# Patient Record
Sex: Male | Born: 1945 | Race: White | Hispanic: No | Marital: Married | Smoking: Former smoker
Health system: Southern US, Community
[De-identification: ages and names within clinical notes are randomized; demographics above are authoritative.]

## PROBLEM LIST (undated history)

## (undated) DIAGNOSIS — B159 Hepatitis A without hepatic coma: Secondary | ICD-10-CM

## (undated) DIAGNOSIS — Z860100 Personal history of colon polyps, unspecified: Secondary | ICD-10-CM

## (undated) DIAGNOSIS — I4891 Unspecified atrial fibrillation: Secondary | ICD-10-CM

## (undated) DIAGNOSIS — I509 Heart failure, unspecified: Secondary | ICD-10-CM

## (undated) DIAGNOSIS — I10 Essential (primary) hypertension: Secondary | ICD-10-CM

## (undated) DIAGNOSIS — J449 Chronic obstructive pulmonary disease, unspecified: Secondary | ICD-10-CM

## (undated) DIAGNOSIS — G4730 Sleep apnea, unspecified: Secondary | ICD-10-CM

## (undated) DIAGNOSIS — F32A Depression, unspecified: Secondary | ICD-10-CM

## (undated) DIAGNOSIS — E785 Hyperlipidemia, unspecified: Secondary | ICD-10-CM

## (undated) DIAGNOSIS — I2510 Atherosclerotic heart disease of native coronary artery without angina pectoris: Secondary | ICD-10-CM

## (undated) DIAGNOSIS — F329 Major depressive disorder, single episode, unspecified: Secondary | ICD-10-CM

## (undated) DIAGNOSIS — C801 Malignant (primary) neoplasm, unspecified: Secondary | ICD-10-CM

## (undated) DIAGNOSIS — I059 Rheumatic mitral valve disease, unspecified: Secondary | ICD-10-CM

## (undated) HISTORY — DX: Atherosclerotic heart disease of native coronary artery without angina pectoris: I25.10

## (undated) HISTORY — PX: CORONARY ARTERY BYPASS GRAFT: SHX141

## (undated) HISTORY — PX: CARDIAC VALVE REPLACEMENT: SHX585

## (undated) HISTORY — PX: CARDIAC SURGERY: SHX584

## (undated) HISTORY — PX: EYE SURGERY: SHX253

## (undated) HISTORY — PX: CORONARY ANGIOPLASTY WITH STENT PLACEMENT: SHX49

---
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
## 1995-04-11 HISTORY — PX: MITRAL VALVE REPAIR: SHX2039

## 2003-07-03 ENCOUNTER — Other Ambulatory Visit: Payer: Self-pay

## 2006-02-05 ENCOUNTER — Ambulatory Visit: Payer: Self-pay | Admitting: Cardiology

## 2007-03-01 ENCOUNTER — Ambulatory Visit: Payer: Self-pay | Admitting: Unknown Physician Specialty

## 2009-04-10 HISTORY — PX: CORONARY ANGIOPLASTY WITH STENT PLACEMENT: SHX49

## 2010-03-16 ENCOUNTER — Ambulatory Visit: Payer: Self-pay | Admitting: Cardiology

## 2011-02-03 DIAGNOSIS — R0602 Shortness of breath: Secondary | ICD-10-CM | POA: Insufficient documentation

## 2011-02-03 DIAGNOSIS — J449 Chronic obstructive pulmonary disease, unspecified: Secondary | ICD-10-CM | POA: Insufficient documentation

## 2011-02-03 DIAGNOSIS — R0683 Snoring: Secondary | ICD-10-CM | POA: Insufficient documentation

## 2011-04-11 HISTORY — PX: CARDIOVERSION: SHX1299

## 2011-05-31 ENCOUNTER — Encounter: Payer: Self-pay | Admitting: Cardiology

## 2011-06-09 ENCOUNTER — Ambulatory Visit: Payer: Self-pay | Admitting: Cardiology

## 2011-06-10 ENCOUNTER — Encounter: Payer: Self-pay | Admitting: Cardiology

## 2011-07-10 ENCOUNTER — Encounter: Payer: Self-pay | Admitting: Cardiology

## 2012-02-07 DIAGNOSIS — B159 Hepatitis A without hepatic coma: Secondary | ICD-10-CM | POA: Insufficient documentation

## 2012-08-16 ENCOUNTER — Ambulatory Visit: Payer: Self-pay | Admitting: Unknown Physician Specialty

## 2013-12-24 DIAGNOSIS — I10 Essential (primary) hypertension: Secondary | ICD-10-CM | POA: Insufficient documentation

## 2013-12-24 DIAGNOSIS — I2510 Atherosclerotic heart disease of native coronary artery without angina pectoris: Secondary | ICD-10-CM | POA: Insufficient documentation

## 2014-01-19 DIAGNOSIS — I2510 Atherosclerotic heart disease of native coronary artery without angina pectoris: Secondary | ICD-10-CM | POA: Insufficient documentation

## 2014-01-19 DIAGNOSIS — I4891 Unspecified atrial fibrillation: Secondary | ICD-10-CM | POA: Insufficient documentation

## 2014-01-19 DIAGNOSIS — G4730 Sleep apnea, unspecified: Secondary | ICD-10-CM | POA: Insufficient documentation

## 2014-08-02 NOTE — Op Note (Signed)
PATIENT NAME:  Leslie Prince, Leslie Prince MR#:  [id number] DATE OF BIRTH:  December 19, 1945  DATE OF PROCEDURE:  06/09/2011  PRIMARY CARE PHYSICIAN: [name], MD  PREPROCEDURE DIAGNOSIS: Atrial fibrillation.   PROCEDURE: Elective cardioversion.   POSTPROCEDURE DIAGNOSIS: Persistent atrial fibrillation.   SURGEON: Isaias Cowman, M.D.   INDICATION: The patient is a 69 year old gentleman with known coronary artery disease with recent diagnosis of atrial fibrillation.   DESCRIPTION OF PROCEDURE: The patient was brought to the special holding area in a fasting state. Anesthesia was obtained with 40 mg of Diprivan. Cardioversion was attempted at 75, 120, and 200 joules. The patient would temporarily convert to sinus then revert to atrial fibrillation. There were no complications. ____________________________ Isaias Cowman, MD ap:slb D: 06/09/2011 07:57:09 ET T: 06/09/2011 10:28:21 ET JOB#: [id number]  cc: Isaias Cowman, MD, <Dictator> Isaias Cowman MD ELECTRONICALLY SIGNED 06/15/2011 13:10

## 2015-01-20 DIAGNOSIS — I5032 Chronic diastolic (congestive) heart failure: Secondary | ICD-10-CM | POA: Insufficient documentation

## 2015-09-21 ENCOUNTER — Encounter: Payer: Self-pay | Admitting: Oncology

## 2015-09-21 ENCOUNTER — Inpatient Hospital Stay: Payer: Medicare Other

## 2015-09-21 ENCOUNTER — Inpatient Hospital Stay: Payer: Medicare Other | Attending: Oncology | Admitting: Oncology

## 2015-09-21 DIAGNOSIS — E669 Obesity, unspecified: Secondary | ICD-10-CM | POA: Insufficient documentation

## 2015-09-21 DIAGNOSIS — E785 Hyperlipidemia, unspecified: Secondary | ICD-10-CM | POA: Insufficient documentation

## 2015-09-21 DIAGNOSIS — Z87891 Personal history of nicotine dependence: Secondary | ICD-10-CM | POA: Diagnosis not present

## 2015-09-21 DIAGNOSIS — D72829 Elevated white blood cell count, unspecified: Secondary | ICD-10-CM | POA: Insufficient documentation

## 2015-09-21 DIAGNOSIS — Z79899 Other long term (current) drug therapy: Secondary | ICD-10-CM | POA: Diagnosis not present

## 2015-09-21 DIAGNOSIS — Z9109 Other allergy status, other than to drugs and biological substances: Secondary | ICD-10-CM | POA: Insufficient documentation

## 2015-09-21 DIAGNOSIS — I059 Rheumatic mitral valve disease, unspecified: Secondary | ICD-10-CM | POA: Insufficient documentation

## 2015-09-21 DIAGNOSIS — I509 Heart failure, unspecified: Secondary | ICD-10-CM | POA: Insufficient documentation

## 2015-09-21 LAB — CBC WITH DIFFERENTIAL/PLATELET
BASOS ABS: 0 10*3/uL (ref 0–0.1)
BASOS PCT: 0 %
EOS ABS: 0.3 10*3/uL (ref 0–0.7)
Eosinophils Relative: 5 %
HCT: 38.1 % — ABNORMAL LOW (ref 40.0–52.0)
HEMOGLOBIN: 13.2 g/dL (ref 13.0–18.0)
LYMPHS ABS: 1.1 10*3/uL (ref 1.0–3.6)
LYMPHS PCT: 17 %
MCH: 31.8 pg (ref 26.0–34.0)
MCHC: 34.6 g/dL (ref 32.0–36.0)
MCV: 91.9 fL (ref 80.0–100.0)
Monocytes Absolute: 0.6 10*3/uL (ref 0.2–1.0)
Monocytes Relative: 10 %
NEUTROS ABS: 4.3 10*3/uL (ref 1.4–6.5)
Neutrophils Relative %: 68 %
Platelets: 149 10*3/uL — ABNORMAL LOW (ref 150–440)
RBC: 4.15 MIL/uL — ABNORMAL LOW (ref 4.40–5.90)
RDW: 13.7 % (ref 11.5–14.5)
WBC: 6.4 10*3/uL (ref 3.8–10.6)

## 2015-09-24 LAB — COMP PANEL: LEUKEMIA/LYMPHOMA

## 2015-09-24 LAB — BCR-ABL1 KINASE DOMAIN MUTATION ANALYSIS

## 2015-09-28 NOTE — Progress Notes (Signed)
[facility]  [phone] [phone]  ID: Leslie Prince OB: 17-Feb-1946  MR#: [id number]  QUR#:[id number]  Patient Care Team: Dorothey Baseman, MD as PCP - General (Family Medicine)  CHIEF COMPLAINT: Leukocytosis.  INTERVAL HISTORY: Patient is a 70 year old male who was found to have a persistently elevated white blood cell count on routine blood work. Repeat testing confirmed the results. Currently, he feels well and is asymptomatic. He denies any recent fevers or illnesses. He has no new medications. He has a good appetite and denies weight loss. He has no neurologic complaints. He denies any chest pain or shortness of breath. He denies any nausea, vomiting, constipation, or diarrhea. He has no urinary complaints. Patient feels at his baseline and offers no specific complaints today.  REVIEW OF SYSTEMS:   Review of Systems  Constitutional: Negative.  Negative for fever, weight loss and malaise/fatigue.  Respiratory: Negative.  Negative for cough and shortness of breath.   Cardiovascular: Negative.  Negative for chest pain.  Gastrointestinal: Negative.  Negative for abdominal pain.  Genitourinary: Negative.   Musculoskeletal: Negative.   Neurological: Negative.  Negative for weakness.  Psychiatric/Behavioral: Negative.     As per HPI. Otherwise, a complete review of systems is negatve.  PAST MEDICAL HISTORY: No past medical history on file.  PAST SURGICAL HISTORY: No past surgical history on file.  FAMILY HISTORY: Reviewed and unchanged. No reported history of malignancy or chronic disease.     ADVANCED DIRECTIVES:    HEALTH MAINTENANCE: Social History  Substance Use Topics  . Smoking status: Former Smoker -- 1.00 packs/day for 25 years    Types: Cigarettes    Quit date: 06/21/1995  . Smokeless tobacco: Not on file  . Alcohol Use: Not on file     Colonoscopy:  PAP:  Bone density:  Lipid panel:  Allergies  Allergen Reactions    . Lisinopril Other (See Comments)    Severe urination    Current Outpatient Prescriptions  Medication Sig Dispense Refill  . albuterol (PROAIR HFA) 108 (90 Base) MCG/ACT inhaler Inhale into the lungs.    Marland Kitchen amoxicillin (AMOXIL) 500 MG capsule     . aspirin EC 81 MG tablet Take by mouth.    Marland Kitchen atorvastatin (LIPITOR) 20 MG tablet Take 20 mg by mouth daily.  1  . dabigatran (PRADAXA) 150 MG CAPS capsule Take by mouth.    . enalapril (VASOTEC) 20 MG tablet     . Fluticasone-Salmeterol (ADVAIR DISKUS) 250-50 MCG/DOSE AEPB     . furosemide (LASIX) 40 MG tablet Take by mouth.    . hydrochlorothiazide (HYDRODIURIL) 25 MG tablet     . KLOR-CON M10 10 MEQ tablet Take 20 mEq by mouth daily.  4  . metoprolol succinate (TOPROL-XL) 50 MG 24 hr tablet     . montelukast (SINGULAIR) 10 MG tablet TAKE 1 TABLET (10 MG TOTAL) BY MOUTH NIGHTLY.  3  . potassium chloride (K-DUR) 10 MEQ tablet Take by mouth.    . tiotropium (SPIRIVA HANDIHALER) 18 MCG inhalation capsule Place into inhaler and inhale.    . vitamin C (ASCORBIC ACID) 500 MG tablet Take by mouth.    . vitamin E 400 UNIT capsule Take by mouth.    . Multiple Vitamins tablet Take by mouth.     No current facility-administered medications for this visit.    OBJECTIVE: There were no vitals filed for this visit.   There is no height or weight on file to calculate BMI.  ECOG FS:0 - Asymptomatic  General: Well-developed, well-nourished, no acute distress. Eyes: Pink conjunctiva, anicteric sclera. HEENT: Normocephalic, moist mucous membranes, clear oropharnyx. Lungs: Clear to auscultation bilaterally. Heart: Regular rate and rhythm. No rubs, murmurs, or gallops. Abdomen: Soft, nontender, nondistended. No organomegaly noted, normoactive bowel sounds. Musculoskeletal: No edema, cyanosis, or clubbing. Neuro: Alert, answering all questions appropriately. Cranial nerves grossly intact. Skin: No rashes or petechiae noted. Psych: Normal  affect. Lymphatics: No cervical, calvicular, axillary or inguinal LAD.   LAB RESULTS:  No results found for: NA, K, CL, CO2, GLUCOSE, BUN, CREATININE, CALCIUM, PROT, ALBUMIN, AST, ALT, ALKPHOS, BILITOT, GFRNONAA, GFRAA  Lab Results  Component Value Date   WBC 6.4 09/21/2015   NEUTROABS 4.3 09/21/2015   HGB 13.2 09/21/2015   HCT 38.1* 09/21/2015   MCV 91.9 09/21/2015   PLT 149* 09/21/2015     STUDIES: No results found.  ASSESSMENT: Leukocytosis, resolved.  PLAN:    1. Leukocytosis: Patient previously had persistently elevated white blood cell count, but today it is normal at 6.4. The remainder of his blood work including peripheral blood flow cytometry to assess for CLL as well as BCR-ABL to assess for CML are either negative or within normal limits. No intervention is needed at this time. Patient does not require bone marrow biopsy. Return to clinic in 6 months with repeat laboratory work and further evaluation. If his white blood cell count continues to be within normal limits, he likely can be discharged from clinic.  Patient expressed understanding and was in agreement with this plan. He also understands that He can call clinic at any time with any questions, concerns, or complaints.    Lloyd Huger, MD   09/28/2015 8:36 AM

## 2015-11-03 ENCOUNTER — Encounter: Payer: Self-pay | Admitting: Oncology

## 2016-03-21 ENCOUNTER — Ambulatory Visit: Payer: Medicare Other | Admitting: Oncology

## 2016-03-21 ENCOUNTER — Other Ambulatory Visit: Payer: Medicare Other

## 2017-05-22 ENCOUNTER — Emergency Department: Payer: Medicare Other

## 2017-05-22 ENCOUNTER — Inpatient Hospital Stay
Admission: EM | Admit: 2017-05-22 | Discharge: 2017-05-25 | DRG: 871 | Disposition: A | Discharge status: Home Health | Payer: Medicare Other | Attending: Internal Medicine | Admitting: Internal Medicine

## 2017-05-22 ENCOUNTER — Other Ambulatory Visit: Payer: Self-pay

## 2017-05-22 ENCOUNTER — Encounter: Payer: Self-pay | Admitting: Emergency Medicine

## 2017-05-22 DIAGNOSIS — E785 Hyperlipidemia, unspecified: Secondary | ICD-10-CM | POA: Diagnosis present

## 2017-05-22 DIAGNOSIS — Z7982 Long term (current) use of aspirin: Secondary | ICD-10-CM

## 2017-05-22 DIAGNOSIS — I50813 Acute on chronic right heart failure: Secondary | ICD-10-CM

## 2017-05-22 DIAGNOSIS — R0902 Hypoxemia: Secondary | ICD-10-CM | POA: Diagnosis present

## 2017-05-22 DIAGNOSIS — Z7902 Long term (current) use of antithrombotics/antiplatelets: Secondary | ICD-10-CM | POA: Diagnosis not present

## 2017-05-22 DIAGNOSIS — I110 Hypertensive heart disease with heart failure: Secondary | ICD-10-CM | POA: Diagnosis present

## 2017-05-22 DIAGNOSIS — Z955 Presence of coronary angioplasty implant and graft: Secondary | ICD-10-CM

## 2017-05-22 DIAGNOSIS — Z888 Allergy status to other drugs, medicaments and biological substances status: Secondary | ICD-10-CM | POA: Diagnosis not present

## 2017-05-22 DIAGNOSIS — I2510 Atherosclerotic heart disease of native coronary artery without angina pectoris: Secondary | ICD-10-CM | POA: Diagnosis present

## 2017-05-22 DIAGNOSIS — I4891 Unspecified atrial fibrillation: Secondary | ICD-10-CM | POA: Diagnosis present

## 2017-05-22 DIAGNOSIS — I482 Chronic atrial fibrillation: Secondary | ICD-10-CM | POA: Diagnosis present

## 2017-05-22 DIAGNOSIS — A419 Sepsis, unspecified organism: Secondary | ICD-10-CM | POA: Diagnosis present

## 2017-05-22 DIAGNOSIS — E0590 Thyrotoxicosis, unspecified without thyrotoxic crisis or storm: Secondary | ICD-10-CM | POA: Diagnosis present

## 2017-05-22 DIAGNOSIS — I5032 Chronic diastolic (congestive) heart failure: Secondary | ICD-10-CM | POA: Diagnosis present

## 2017-05-22 DIAGNOSIS — Z952 Presence of prosthetic heart valve: Secondary | ICD-10-CM

## 2017-05-22 DIAGNOSIS — J189 Pneumonia, unspecified organism: Secondary | ICD-10-CM | POA: Diagnosis present

## 2017-05-22 DIAGNOSIS — J440 Chronic obstructive pulmonary disease with acute lower respiratory infection: Secondary | ICD-10-CM | POA: Diagnosis present

## 2017-05-22 DIAGNOSIS — R0602 Shortness of breath: Secondary | ICD-10-CM | POA: Diagnosis not present

## 2017-05-22 DIAGNOSIS — Z87891 Personal history of nicotine dependence: Secondary | ICD-10-CM

## 2017-05-22 HISTORY — DX: Sleep apnea, unspecified: G47.30

## 2017-05-22 HISTORY — DX: Chronic obstructive pulmonary disease, unspecified: J44.9

## 2017-05-22 HISTORY — DX: Heart failure, unspecified: I50.9

## 2017-05-22 LAB — CBC WITH DIFFERENTIAL/PLATELET
BASOS ABS: 0 10*3/uL (ref 0–0.1)
BASOS PCT: 0 %
EOS PCT: 0 %
Eosinophils Absolute: 0.1 10*3/uL (ref 0–0.7)
HCT: 39.2 % — ABNORMAL LOW (ref 40.0–52.0)
Hemoglobin: 13 g/dL (ref 13.0–18.0)
Lymphocytes Relative: 4 %
Lymphs Abs: 0.5 10*3/uL — ABNORMAL LOW (ref 1.0–3.6)
MCH: 30 pg (ref 26.0–34.0)
MCHC: 33.1 g/dL (ref 32.0–36.0)
MCV: 90.6 fL (ref 80.0–100.0)
MONO ABS: 1 10*3/uL (ref 0.2–1.0)
Monocytes Relative: 8 %
NEUTROS ABS: 11.1 10*3/uL — AB (ref 1.4–6.5)
Neutrophils Relative %: 88 %
PLATELETS: 222 10*3/uL (ref 150–440)
RBC: 4.32 MIL/uL — ABNORMAL LOW (ref 4.40–5.90)
RDW: 14.6 % — AB (ref 11.5–14.5)
WBC: 12.7 10*3/uL — AB (ref 3.8–10.6)

## 2017-05-22 LAB — INFLUENZA PANEL BY PCR (TYPE A & B)
Influenza A By PCR: NEGATIVE
Influenza B By PCR: NEGATIVE

## 2017-05-22 LAB — BLOOD GAS, VENOUS
Acid-Base Excess: 1.7 mmol/L (ref 0.0–2.0)
Bicarbonate: 25.8 mmol/L (ref 20.0–28.0)
O2 Saturation: 79.6 %
PATIENT TEMPERATURE: 37
PCO2 VEN: 38 mmHg — AB (ref 44.0–60.0)
PH VEN: 7.44 — AB (ref 7.250–7.430)
PO2 VEN: 42 mmHg (ref 32.0–45.0)

## 2017-05-22 LAB — COMPREHENSIVE METABOLIC PANEL
ALBUMIN: 3.2 g/dL — AB (ref 3.5–5.0)
ALK PHOS: 60 U/L (ref 38–126)
ALT: 31 U/L (ref 17–63)
ANION GAP: 12 (ref 5–15)
AST: 43 U/L — ABNORMAL HIGH (ref 15–41)
BILIRUBIN TOTAL: 2 mg/dL — AB (ref 0.3–1.2)
BUN: 15 mg/dL (ref 6–20)
CALCIUM: 8.7 mg/dL — AB (ref 8.9–10.3)
CO2: 25 mmol/L (ref 22–32)
Chloride: 100 mmol/L — ABNORMAL LOW (ref 101–111)
Creatinine, Ser: 1.01 mg/dL (ref 0.61–1.24)
GFR calc Af Amer: >60 mL/min (ref >60)
GFR calc non Af Amer: >60 mL/min (ref >60)
GLUCOSE: 116 mg/dL — AB (ref 65–99)
Potassium: 3.7 mmol/L (ref 3.5–5.1)
Sodium: 137 mmol/L (ref 135–145)
TOTAL PROTEIN: 7.5 g/dL (ref 6.5–8.1)

## 2017-05-22 LAB — URINALYSIS, COMPLETE (UACMP) WITH MICROSCOPIC
Bacteria, UA: NONE SEEN
Bilirubin Urine: NEGATIVE
Glucose, UA: NEGATIVE mg/dL
Ketones, ur: 5 mg/dL — AB
Nitrite: NEGATIVE
Protein, ur: NEGATIVE mg/dL
Specific Gravity, Urine: 1.014 (ref 1.005–1.030)
Squamous Epithelial / LPF: NONE SEEN
pH: 5 (ref 5.0–8.0)

## 2017-05-22 LAB — LACTIC ACID, PLASMA: Lactic Acid, Venous: 1.6 mmol/L (ref 0.5–1.9)

## 2017-05-22 LAB — TSH: TSH: 0.258 u[IU]/mL — ABNORMAL LOW (ref 0.350–4.500)

## 2017-05-22 LAB — TROPONIN I: TROPONIN I: 0.04 ng/mL — AB (ref <0.03)

## 2017-05-22 LAB — BRAIN NATRIURETIC PEPTIDE: B NATRIURETIC PEPTIDE 5: 317 pg/mL — AB (ref 0.0–100.0)

## 2017-05-22 MED ORDER — SODIUM CHLORIDE 0.9 % IV BOLUS (SEPSIS)
500.0000 mL | Freq: Once | INTRAVENOUS | Status: AC
  Administered 2017-05-22: 500 mL via INTRAVENOUS

## 2017-05-22 MED ORDER — MONTELUKAST SODIUM 10 MG PO TABS
10.0000 mg | ORAL_TABLET | Freq: Every day | ORAL | Status: DC
  Administered 2017-05-22 – 2017-05-24 (×3): 10 mg via ORAL
  Filled 2017-05-22 (×3): qty 1

## 2017-05-22 MED ORDER — ACETAMINOPHEN 325 MG PO TABS
650.0000 mg | ORAL_TABLET | Freq: Four times a day (QID) | ORAL | Status: DC | PRN
  Administered 2017-05-23: 650 mg via ORAL
  Filled 2017-05-22 (×2): qty 2

## 2017-05-22 MED ORDER — COLCHICINE 0.6 MG PO TABS
1.2000 mg | ORAL_TABLET | Freq: Every day | ORAL | Status: DC | PRN
  Administered 2017-05-24 (×2): 1.2 mg via ORAL
  Filled 2017-05-22: qty 2

## 2017-05-22 MED ORDER — ONDANSETRON HCL 4 MG PO TABS: 4.0000 mg | ORAL_TABLET | Freq: Four times a day (QID) | ORAL | Status: DC | PRN

## 2017-05-22 MED ORDER — ONDANSETRON HCL 4 MG/2ML IJ SOLN: 4.0000 mg | Freq: Four times a day (QID) | INTRAMUSCULAR | Status: DC | PRN

## 2017-05-22 MED ORDER — SODIUM CHLORIDE 0.9 % IV SOLN
500.0000 mg | Freq: Once | INTRAVENOUS | Status: AC
  Administered 2017-05-22: 500 mg via INTRAVENOUS
  Filled 2017-05-22: qty 500

## 2017-05-22 MED ORDER — ORAL CARE MOUTH RINSE
15.0000 mL | Freq: Two times a day (BID) | OROMUCOSAL | Status: DC
  Administered 2017-05-22 – 2017-05-24 (×3): 15 mL via OROMUCOSAL

## 2017-05-22 MED ORDER — FUROSEMIDE 40 MG PO TABS
40.0000 mg | ORAL_TABLET | Freq: Every day | ORAL | Status: DC
  Administered 2017-05-22 – 2017-05-25 (×4): 40 mg via ORAL
  Filled 2017-05-22 (×4): qty 1

## 2017-05-22 MED ORDER — DILTIAZEM HCL 25 MG/5ML IV SOLN
15.0000 mg | Freq: Once | INTRAVENOUS | Status: AC
  Administered 2017-05-22: 15 mg via INTRAVENOUS
  Filled 2017-05-22: qty 5

## 2017-05-22 MED ORDER — ATORVASTATIN CALCIUM 20 MG PO TABS
20.0000 mg | ORAL_TABLET | Freq: Every day | ORAL | Status: DC
  Administered 2017-05-22 – 2017-05-24 (×3): 20 mg via ORAL
  Filled 2017-05-22 (×3): qty 1

## 2017-05-22 MED ORDER — ALBUTEROL SULFATE (2.5 MG/3ML) 0.083% IN NEBU: 2.5000 mg | INHALATION_SOLUTION | Freq: Four times a day (QID) | RESPIRATORY_TRACT | Status: DC | PRN

## 2017-05-22 MED ORDER — CHROMIUM 400 MCG PO TABS: 1.0000 | ORAL_TABLET | Freq: Two times a day (BID) | ORAL | Status: DC

## 2017-05-22 MED ORDER — TIOTROPIUM BROMIDE MONOHYDRATE 18 MCG IN CAPS
18.0000 ug | ORAL_CAPSULE | Freq: Every day | RESPIRATORY_TRACT | Status: DC
  Administered 2017-05-22 – 2017-05-25 (×4): 18 ug via RESPIRATORY_TRACT
  Filled 2017-05-22: qty 5

## 2017-05-22 MED ORDER — VITAMIN C 500 MG PO TABS
500.0000 mg | ORAL_TABLET | Freq: Every day | ORAL | Status: DC
  Administered 2017-05-22 – 2017-05-25 (×4): 500 mg via ORAL
  Filled 2017-05-22 (×4): qty 1

## 2017-05-22 MED ORDER — DILTIAZEM HCL 25 MG/5ML IV SOLN: 20.0000 mg | Freq: Once | INTRAVENOUS | Status: DC

## 2017-05-22 MED ORDER — VITAMIN E 180 MG (400 UNIT) PO CAPS
400.0000 [IU] | ORAL_CAPSULE | Freq: Every day | ORAL | Status: DC
  Administered 2017-05-22 – 2017-05-25 (×4): 400 [IU] via ORAL
  Filled 2017-05-22 (×4): qty 1

## 2017-05-22 MED ORDER — SODIUM CHLORIDE 0.9 % IV SOLN
2.0000 g | INTRAVENOUS | Status: DC
  Administered 2017-05-22 – 2017-05-25 (×4): 2 g via INTRAVENOUS
  Filled 2017-05-22 (×4): qty 20

## 2017-05-22 MED ORDER — SODIUM CHLORIDE 0.9 % IV SOLN
INTRAVENOUS | Status: DC
  Administered 2017-05-22: 07:00:00 via INTRAVENOUS

## 2017-05-22 MED ORDER — GUAIFENESIN-DM 100-10 MG/5ML PO SYRP
5.0000 mL | ORAL_SOLUTION | ORAL | Status: DC | PRN
  Administered 2017-05-22: 5 mL via ORAL
  Filled 2017-05-22: qty 5

## 2017-05-22 MED ORDER — DOCUSATE SODIUM 100 MG PO CAPS
100.0000 mg | ORAL_CAPSULE | Freq: Two times a day (BID) | ORAL | Status: DC
  Administered 2017-05-22 – 2017-05-25 (×4): 100 mg via ORAL
  Filled 2017-05-22 (×6): qty 1

## 2017-05-22 MED ORDER — IPRATROPIUM-ALBUTEROL 0.5-2.5 (3) MG/3ML IN SOLN
3.0000 mL | Freq: Once | RESPIRATORY_TRACT | Status: AC
  Administered 2017-05-22: 3 mL via RESPIRATORY_TRACT
  Filled 2017-05-22: qty 3

## 2017-05-22 MED ORDER — MOMETASONE FURO-FORMOTEROL FUM 200-5 MCG/ACT IN AERO
2.0000 | INHALATION_SPRAY | Freq: Two times a day (BID) | RESPIRATORY_TRACT | Status: DC
  Administered 2017-05-22 – 2017-05-25 (×7): 2 via RESPIRATORY_TRACT
  Filled 2017-05-22: qty 8.8

## 2017-05-22 MED ORDER — SODIUM CHLORIDE 0.9 % IV SOLN
1.0000 g | Freq: Once | INTRAVENOUS | Status: AC
  Administered 2017-05-22: 1 g via INTRAVENOUS
  Filled 2017-05-22: qty 10

## 2017-05-22 MED ORDER — POTASSIUM CHLORIDE CRYS ER 20 MEQ PO TBCR
10.0000 meq | EXTENDED_RELEASE_TABLET | Freq: Two times a day (BID) | ORAL | Status: DC
  Administered 2017-05-22 – 2017-05-25 (×6): 10 meq via ORAL
  Filled 2017-05-22 (×7): qty 1

## 2017-05-22 MED ORDER — DABIGATRAN ETEXILATE MESYLATE 150 MG PO CAPS
150.0000 mg | ORAL_CAPSULE | Freq: Two times a day (BID) | ORAL | Status: DC
  Administered 2017-05-22 – 2017-05-25 (×7): 150 mg via ORAL
  Filled 2017-05-22 (×8): qty 1

## 2017-05-22 MED ORDER — SODIUM CHLORIDE 0.9% FLUSH
3.0000 mL | Freq: Two times a day (BID) | INTRAVENOUS | Status: DC
  Administered 2017-05-22 – 2017-05-25 (×5): 3 mL via INTRAVENOUS

## 2017-05-22 MED ORDER — METOPROLOL SUCCINATE ER 50 MG PO TB24
50.0000 mg | ORAL_TABLET | Freq: Every day | ORAL | Status: DC
  Administered 2017-05-22 – 2017-05-25 (×4): 50 mg via ORAL
  Filled 2017-05-22 (×4): qty 1

## 2017-05-22 MED ORDER — SODIUM CHLORIDE 0.9 % IV SOLN
500.0000 mg | INTRAVENOUS | Status: DC
  Administered 2017-05-22 – 2017-05-24 (×3): 500 mg via INTRAVENOUS
  Filled 2017-05-22 (×4): qty 500

## 2017-05-22 MED ORDER — ACETAMINOPHEN 500 MG PO TABS
1000.0000 mg | ORAL_TABLET | Freq: Once | ORAL | Status: AC
  Administered 2017-05-22: 1000 mg via ORAL
  Filled 2017-05-22: qty 2

## 2017-05-22 MED ORDER — AMIODARONE HCL IN DEXTROSE 360-4.14 MG/200ML-% IV SOLN
60.0000 mg/h | INTRAVENOUS | Status: AC
  Administered 2017-05-22: 60 mg/h via INTRAVENOUS
  Filled 2017-05-22: qty 200

## 2017-05-22 MED ORDER — AMIODARONE HCL IN DEXTROSE 360-4.14 MG/200ML-% IV SOLN
30.0000 mg/h | INTRAVENOUS | Status: DC
  Administered 2017-05-22 – 2017-05-24 (×5): 30 mg/h via INTRAVENOUS
  Filled 2017-05-22 (×5): qty 200

## 2017-05-22 MED ORDER — ENALAPRIL MALEATE 10 MG PO TABS
20.0000 mg | ORAL_TABLET | Freq: Two times a day (BID) | ORAL | Status: DC
Start: 2017-05-22 — End: 2017-05-25
  Administered 2017-05-22 – 2017-05-25 (×7): 20 mg via ORAL
  Filled 2017-05-22 (×8): qty 2

## 2017-05-22 MED ORDER — ACETAMINOPHEN 650 MG RE SUPP: 650.0000 mg | Freq: Four times a day (QID) | RECTAL | Status: DC | PRN

## 2017-05-22 MED ORDER — ADULT MULTIVITAMIN W/MINERALS CH
1.0000 | ORAL_TABLET | Freq: Every day | ORAL | Status: DC
  Administered 2017-05-22 – 2017-05-25 (×4): 1 via ORAL
  Filled 2017-05-22 (×4): qty 1

## 2017-05-22 MED ORDER — ASPIRIN EC 81 MG PO TBEC
81.0000 mg | DELAYED_RELEASE_TABLET | Freq: Every day | ORAL | Status: DC
  Administered 2017-05-22 – 2017-05-25 (×4): 81 mg via ORAL
  Filled 2017-05-22 (×4): qty 1

## 2017-05-22 MED ORDER — SODIUM CHLORIDE 0.9 % IV BOLUS (SEPSIS)
500.0000 mL | INTRAVENOUS | Status: AC
  Administered 2017-05-22: 500 mL via INTRAVENOUS

## 2017-05-22 MED ORDER — COLCHICINE 0.6 MG PO TABS
0.6000 mg | ORAL_TABLET | ORAL | Status: DC
Start: 2017-05-22 — End: 2017-05-22

## 2017-05-22 NOTE — Progress Notes (Signed)
This is a 71 year old male admitted for atrial fibrillation with rapid ventricular rate. 1.  Atrial fibrillation: With RVR; stable but with persistent tachycardia.  I started the patient on amiodarone drip to control his heart rate.  Continue Pradaxa and aspirin for anticoagulation.  Monitor telemetry.  Consult cardiology. 2.  Pneumonia: Community-acquired; continue ceftriaxone and azithromycin.  Supplemental oxygen as needed. 3.  Sepsis: The patient meets all 4 criteria for sepsis.  He is hemodynamically stable.  Follow blood cultures for growth and sensitivities. 4.  CHF: Diastolic; chronic.  The patient is tolerated fluid boluses well.  Continue lisinopril and metoprolol. 5.  COPD: Consider addition of steroids.  Also continue Spiriva, inhaled corticosteroid and albuterol as needed. 6.  DVT prophylaxis: As above 7.  GI prophylaxis: None The patient is a full code.  Time spent on admission orders and critical care approximately 45 minutes.  Discussed with [facility] telemedicine  Agree with above stated plan

## 2017-05-22 NOTE — ED Provider Notes (Signed)
[facility] Emergency Department Provider Note   ____________________________________________   First MD Initiated Contact with Patient 05/22/17 0140     (approximate)  I have reviewed the triage vital signs and the nursing notes.   HISTORY  Chief Complaint Shortness of Breath    HPI Leslie Prince is a 72 y.o. male who comes into the hospital today with some shortness of breath.  The patient states that he has either had the flu or some cold symptoms since last Wednesday which was approximately 5-6 days ago.  He reports that he also has some COPD and A. fib.  He states that he was getting better but then today he could not stop coughing.  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  The patient reports that he had a fever at home to 100.8.  He last took Tylenol yesterday.  His wife states that he has been a little bit disoriented.  He states that when he coughs its brown looking stuff and looks like some dried up blood.  The patient is here today for evaluation.  Past Medical History:  Diagnosis Date  . COPD (chronic obstructive pulmonary disease) ([place])   . Sleep apnea     Patient Active Problem List   Diagnosis Date Noted  . Atrial fibrillation with RVR ([place]) 05/22/2017  . Congestive heart failure ([place]) 09/21/2015  . Allergy to environmental factors 09/21/2015  . HLD (hyperlipidemia) 09/21/2015  . Disorder of mitral valve 09/21/2015  . Adiposity 09/21/2015  . Chronic diastolic heart failure ([place]) 01/20/2015  . Atrial fibrillation ([place]) 01/19/2014  . Apnea, sleep 01/19/2014  . Arteriosclerosis of coronary artery 01/19/2014  . BP (high blood pressure) 12/24/2013  . Hepatitis A 02/07/2012  . Chronic obstructive pulmonary disease ([place]) 02/03/2011  . Breath shortness 02/03/2011  . Snores 02/03/2011    Past Surgical History:  Procedure Laterality Date  . CARDIAC SURGERY    . CORONARY ANGIOPLASTY WITH  STENT PLACEMENT      Prior to Admission medications   Medication Sig Start Date End Date Taking? Authorizing Provider  albuterol (PROAIR HFA) 108 (90 Base) MCG/ACT inhaler Inhale 2 puffs into the lungs every 6 (six) hours as needed for wheezing.  07/28/14  Yes [provider]  albuterol (PROVENTIL) (2.5 MG/3ML) 0.083% nebulizer solution Take 2.5 mg by nebulization every 6 (six) hours as needed for wheezing.   Yes [provider]  aspirin EC 81 MG tablet Take 81 mg by mouth daily.    Yes [provider]  atorvastatin (LIPITOR) 20 MG tablet Take 20 mg by mouth daily. 09/03/15  Yes [provider]  Chromium 400 MCG TABS Take 1 tablet by mouth 2 (two) times daily.   Yes [provider]  colchicine 0.6 MG tablet Take 0.6 mg by mouth as directed. Take 2 tablets at onset of gout flare, then 1 tablet one hour later   Yes [provider]  dabigatran (PRADAXA) 150 MG CAPS capsule Take 150 mg by mouth 2 (two) times daily.  05/11/15  Yes [provider]  enalapril (VASOTEC) 20 MG tablet Take 20 mg by mouth 2 (two) times daily.  09/07/15  Yes [provider]  Fluticasone-Salmeterol (ADVAIR DISKUS) 250-50 MCG/DOSE AEPB Inhale 1 puff into the lungs 2 (two) times daily.  01/26/15  Yes [provider]  furosemide (LASIX) 40 MG tablet Take 40 mg by mouth daily.   Yes [provider]  Rebeca Allegra  M10 10 MEQ tablet Take 10 mEq by mouth 2 (two) times daily.  09/03/15  Yes [provider]  metoprolol succinate (TOPROL-XL) 50 MG 24 hr tablet Take 50 mg by mouth daily.  01/25/15  Yes [provider]  montelukast (SINGULAIR) 10 MG tablet TAKE 1 TABLET (10 MG TOTAL) BY MOUTH NIGHTLY. 08/06/15  Yes [provider]  Multiple Vitamins tablet Take 1 tablet by mouth daily.    Yes [provider]  tiotropium (SPIRIVA) 18 MCG inhalation capsule Place 18 mcg into inhaler and inhale daily.   Yes [provider]  vitamin C (ASCORBIC ACID) 500 MG tablet Take 500 mg by mouth daily.    Yes [provider]  vitamin E 400 UNIT capsule Take 400 Units by mouth daily.    Yes [provider]    Allergies Lisinopril  No family history on file.  Social History Social History   Tobacco Use  . Smoking status: Former Smoker    Packs/day: 1.00    Years: 25.00    Pack years: 25.00    Types: Cigarettes    Last attempt to quit: 06/21/1995    Years since quitting: 21.9  . Smokeless tobacco: Never Used  Substance Use Topics  . Alcohol use: Yes  . Drug use: Not on file    Review of Systems  Constitutional:  fever/chills Eyes: No visual changes. ENT: No sore throat. Cardiovascular: palpitations Respiratory: cough and shortness of breath. Gastrointestinal: No abdominal pain.  No nausea, no vomiting.  No diarrhea.  No constipation. Genitourinary: Negative for dysuria. Musculoskeletal: Negative for back pain. Skin: Negative for rash. Neurological: Negative for headaches, focal weakness or numbness. Psych: mild confusion  ____________________________________________   PHYSICAL EXAM:  VITAL SIGNS: ED Triage Vitals  Enc Vitals Group     BP 05/22/17 0133 (!) 140/97     Pulse Rate 05/22/17 0133 (!) 155     Resp 05/22/17 0133 18     Temp 05/22/17 0133 (!) 100.4 F (38 C)     Temp Source 05/22/17 0133 Oral     SpO2 05/22/17 0133 91 %     Weight 05/22/17 0130 285 lb (129.3 kg)     Height 05/22/17 0130 5\' 10"  (1.778 m)     Head Circumference --      Peak Flow --      Pain Score --      Pain Loc --      Pain Edu? --      Excl. in [place]? --     Constitutional: Alert and oriented. Ill appearing and in moderate distress. Eyes: Conjunctivae are normal. PERRL. EOMI. Head: Atraumatic. Nose: No congestion/rhinnorhea. Mouth/Throat: Mucous membranes are moist.  Oropharynx non-erythematous. Cardiovascular: Tachycardia, irregular rhythm. Grossly normal heart sounds.  Good peripheral  circulation. Respiratory: Tachypnea.  No retractions. Mild expiratory wheezes Gastrointestinal: Soft and nontender. No distention. Positive bowel sounds Musculoskeletal: No lower extremity tenderness nor edema.   Neurologic:  Normal speech and language.  Skin:  Skin is warm, dry and intact.  Psychiatric: Mood and affect are normal.   ____________________________________________   LABS (all labs ordered are listed, but only abnormal results are displayed)  Labs Reviewed  COMPREHENSIVE METABOLIC PANEL - Abnormal; Notable for the following components:      Result Value   Chloride 100 (*)    Glucose, Bld 116 (*)    Calcium 8.7 (*)    Albumin 3.2 (*)    AST 43 (*)    Total  Bilirubin 2.0 (*)    All other components within normal limits  CBC WITH DIFFERENTIAL/PLATELET - Abnormal; Notable for the following components:   WBC 12.7 (*)    RBC 4.32 (*)    HCT 39.2 (*)    RDW 14.6 (*)    Neutro Abs 11.1 (*)    Lymphs Abs 0.5 (*)    All other components within normal limits  TROPONIN I - Abnormal; Notable for the following components:   Troponin I 0.04 (*)    All other components within normal limits  BRAIN NATRIURETIC PEPTIDE - Abnormal; Notable for the following components:   B Natriuretic Peptide 317.0 (*)    All other components within normal limits  BLOOD GAS, VENOUS - Abnormal; Notable for the following components:   pH, Ven 7.44 (*)    pCO2, Ven 38 (*)    All other components within normal limits  CULTURE, BLOOD (ROUTINE X 2)  CULTURE, BLOOD (ROUTINE X 2)  LACTIC ACID, PLASMA  INFLUENZA PANEL BY PCR (TYPE A & B)  URINALYSIS, COMPLETE (UACMP) WITH MICROSCOPIC   ____________________________________________  EKG  ED ECG REPORT I, Loney Hering, the attending physician, personally viewed and interpreted this ECG.   Date: 05/22/2017  EKG Time: 132  Rate: 155  Rhythm: atrial fibrillation with rapid ventricular rate, rate 155  Axis: normal  Intervals:none  ST&T  Change: ST depression in lead II  ____________________________________________  RADIOLOGY  ED MD interpretation:  CXR: Right lower lobe pneumonia  Official radiology report(s): Dg Chest Portable 1 View  Result Date: 05/22/2017 CLINICAL DATA:  Dyspnea and palpitations since Wednesday. Productive cough. EXAM: PORTABLE CHEST 1 VIEW COMPARISON:  Report from 02/01/2017 FINDINGS: Cardiomegaly with aortic atherosclerosis. Median sternotomy sutures with evidence of prior mitral valvular replacement. Patchy airspace opacities are noted in both lower lobes suspicious for pneumonia. No acute osseous appearing abnormality. IMPRESSION: Pneumonic consolidations in both lower lobes. Followup PA and lateral chest X-ray is recommended in 3-4 weeks following trial of antibiotic therapy to ensure resolution and exclude underlying malignancy. Electronically Signed   By: Ashley Royalty M.D.   On: 05/22/2017 02:03    ____________________________________________   PROCEDURES  Procedure(s) performed: please, see procedure note(s).  .Critical Care Performed by: Loney Hering, MD Authorized by: Loney Hering, MD   Critical care provider statement:    Critical care time (minutes):  45   Critical care start time:  05/22/2017 1:40 AM   Critical care end time:  05/22/2017 2:25 AM   Critical care time was exclusive of:  Separately billable procedures and treating other patients   Critical care was necessary to treat or prevent imminent or life-threatening deterioration of the following conditions:  Sepsis (arrythmia)   Critical care was time spent personally by me on the following activities:  Development of treatment plan with patient or surrogate, evaluation of patient's response to treatment, examination of patient, obtaining history from patient or surrogate, ordering and performing treatments and interventions, ordering and review of laboratory studies, ordering and review of radiographic studies, pulse  oximetry, re-evaluation of patient's condition and review of old charts   I assumed direction of critical care for this patient from another provider in my specialty: no      Critical Care performed: Yes, see critical care note(s)  ____________________________________________   INITIAL IMPRESSION / ASSESSMENT AND PLAN / ED COURSE  As part of my medical decision making, I reviewed the following data within the electronic MEDICAL RECORD NUMBER Notes from  prior ED visits and [place] Controlled Substance Database   This is a 72 year old male who comes into the hospital today feeling ill.  He is short of breath and tachycardic.  When the patient came back to the room his heart rate was in the 150s.  The patient was hypoxic to the high 80s and he was in some moderate distress.  My differential diagnosis includes pneumonia, heart failure, influenza  Patient had some blood work drawn.  His white blood cell count is 12.7.  The patient's pH is 7.44 with a PCO2 of 38.  The patient's chest x-ray showed bilateral infiltrates concerning for pneumonia.  The patient's flu test that was negative.  Recheck the remaining blood work which was fairly unremarkable.  The patient's lactic acid was negative.  He received a dose of normal saline 500 mL's as well as some ceftriaxone and azithromycin for his pneumonia.  I gave the patient some diltiazem as he was in A. fib with rapid ventricular rate.  His heart rate improved from the 150s to the 120s.  He also received some Tylenol for his fever.  Once I received all of the results the patient was admitted to the hospitalist service.  He received a DuoNeb treatment and his wheezes did improve.  The patient will be admitted for further evaluation and treatment of his pneumonia.    ED Sepsis - Repeat Assessment   Performed at:    05/22/17  0315  Last Vitals:    Blood pressure 110/79, pulse (!) 121, temperature 98.1 F (36.7 C), temperature source Oral, resp. rate 15, height 5'  10" (1.778 m), weight 129.3 kg (285 lb), SpO2 96 %.  Heart:                 Tachycardia with irregular rhythm  Lungs:     Crackles in bilateral bases with some slight wheezing in                                                             the left  Capillary Refill:   Less than 2 sec  Peripheral Pulse (include location): Radial pulse 2+   Skin (include color):   Cool, pink, moist   ____________________________________________   FINAL CLINICAL IMPRESSION(S) / ED DIAGNOSES  Final diagnoses:  Sepsis, due to unspecified organism [facility])  Community acquired pneumonia, unspecified laterality  Atrial fibrillation with rapid ventricular response ([place])  Hypoxia     ED Discharge Orders    None       Note:  This document was prepared using Dragon voice recognition software and may include unintentional dictation errors.    Loney Hering, MD 05/22/17 [phone]

## 2017-05-22 NOTE — Plan of Care (Signed)
Encourage patient to ambulate in room with assistance while monitoring heart rate. Patient continues to have SOB with minimal exertion.

## 2017-05-22 NOTE — Progress Notes (Signed)
Pharmacy Antibiotic Note  Leslie Prince is a 72 y.o. male admitted on 05/22/2017 with pneumonia.  Pharmacy has been consulted for azithromycin/ceftriaxone dosing.  Plan: Patient received azithromycin 500 mg and ceftriaxone 1g IV x 1  Will continue azithromycin 500 mg IV daily Will start ceftriaxone 2g IV daily  Height: 5\' 10"  (177.8 cm) Weight: 288 lb (130.6 kg) IBW/kg (Calculated) : 73  Temp (24hrs), Avg:99 F (37.2 C), Min:98.1 F (36.7 C), Max:100.4 F (38 C)  Recent Labs  Lab 05/22/17 0148  WBC 12.7*  CREATININE 1.01  LATICACIDVEN 1.6    Estimated Creatinine Clearance: 88.4 mL/min (by C-G formula based on SCr of 1.01 mg/dL).    Allergies  Allergen Reactions  . Lisinopril Other (See Comments)    Severe urination    Thank you for allowing pharmacy to be a part of this patient's care.  Tobie Lords, PharmD, BCPS Clinical Pharmacist 05/22/2017

## 2017-05-22 NOTE — Consult Note (Signed)
[facility] Cardiology  CARDIOLOGY CONSULT NOTE  Patient ID: Leslie Prince MRN: [id number] DOB/AGE: 1945/08/12 72 y.o.  Admit date: 05/22/2017 Referring Physician  Primary Physician Dr. Lovie Macadamia  Primary Cardiologist Dr. Saralyn Pilar Reason for Consultation Afib with RVR  HPI: Leslie Prince is a 72 year old male with PMH of CAD s/p CABG, mitral valve repair, chronic atrial fibrillation, CHF, HTN, HLD, COPD, sleep apnea who presented to the ED on 05/22/16 due to a 5-6 day history of shortness of breath, cough, and cold-like symptoms. Patient was in atrial fibrillation with RVR and cardiology was consulted. Today, patient is sitting up in bed, in no acute distress. He admits to shortness of breath, productive cough, fever, and chills. He denies chest pain or palpitations.   Leslie Prince is followed on a regular basis by Dr. Saralyn Pilar. Last echo in 02/2017 revealed normal left ventricular function with EF of 55%   Review of systems complete and found to be negative unless listed above     Past Medical History:  Diagnosis Date  . CHF (congestive heart failure) ([place])   . COPD (chronic obstructive pulmonary disease) ([place])   . Sleep apnea     Past Surgical History:  Procedure Laterality Date  . CARDIAC SURGERY    . CORONARY ANGIOPLASTY WITH STENT PLACEMENT      Medications Prior to Admission  Medication Sig Dispense Refill Last Dose  . albuterol (PROAIR HFA) 108 (90 Base) MCG/ACT inhaler Inhale 2 puffs into the lungs every 6 (six) hours as needed for wheezing.    prn at prn  . albuterol (PROVENTIL) (2.5 MG/3ML) 0.083% nebulizer solution Take 2.5 mg by nebulization every 6 (six) hours as needed for wheezing.   prn at prn  . aspirin EC 81 MG tablet Take 81 mg by mouth daily.    Past Week at Unknown time  . atorvastatin (LIPITOR) 20 MG tablet Take 20 mg by mouth daily.  1 Past Week at Unknown time  . Chromium 400 MCG TABS Take 1 tablet by mouth 2 (two) times daily.   Past Week at Unknown time  .  colchicine 0.6 MG tablet Take 0.6 mg by mouth as directed. Take 2 tablets at onset of gout flare, then 1 tablet one hour later   unknown at unknown  . dabigatran (PRADAXA) 150 MG CAPS capsule Take 150 mg by mouth 2 (two) times daily.    Past Week at Unknown time  . enalapril (VASOTEC) 20 MG tablet Take 20 mg by mouth 2 (two) times daily.    Past Week at Unknown time  . Fluticasone-Salmeterol (ADVAIR DISKUS) 250-50 MCG/DOSE AEPB Inhale 1 puff into the lungs 2 (two) times daily.    Past Week at Unknown time  . furosemide (LASIX) 40 MG tablet Take 40 mg by mouth daily.   Past Week at Unknown time  . KLOR-CON M10 10 MEQ tablet Take 10 mEq by mouth 2 (two) times daily.   4 Past Week at Unknown time  . metoprolol succinate (TOPROL-XL) 50 MG 24 hr tablet Take 50 mg by mouth daily.    Past Week at Unknown time  . montelukast (SINGULAIR) 10 MG tablet TAKE 1 TABLET (10 MG TOTAL) BY MOUTH NIGHTLY.  3 Past Week at Unknown time  . Multiple Vitamins tablet Take 1 tablet by mouth daily.    Past Week at Unknown time  . tiotropium (SPIRIVA) 18 MCG inhalation capsule Place 18 mcg into inhaler and inhale daily.   Past Week at Unknown time  .  vitamin C (ASCORBIC ACID) 500 MG tablet Take 500 mg by mouth daily.    Past Week at Unknown time  . vitamin E 400 UNIT capsule Take 400 Units by mouth daily.    Past Week at Unknown time   Social History   Socioeconomic History  . Marital status: Married    Spouse name: Not on file  . Number of children: Not on file  . Years of education: Not on file  . Highest education level: Not on file  Social Needs  . Financial resource strain: Not on file  . Food insecurity - worry: Not on file  . Food insecurity - inability: Not on file  . Transportation needs - medical: Not on file  . Transportation needs - non-medical: Not on file  Occupational History  . Not on file  Tobacco Use  . Smoking status: Former Smoker    Packs/day: 1.00    Years: 25.00    Pack years: 25.00     Types: Cigarettes    Last attempt to quit: 06/21/1995    Years since quitting: 21.9  . Smokeless tobacco: Never Used  Substance and Sexual Activity  . Alcohol use: Yes  . Drug use: Not on file  . Sexual activity: Not on file  Other Topics Concern  . Not on file  Social History Narrative  . Not on file    No family history on file.    Review of systems complete and found to be negative unless listed above      PHYSICAL EXAM  General: Well developed, well nourished, in no acute distress HEENT:  Normocephalic and atramatic Neck:  No JVD.  Lungs: Clear bilaterally to auscultation and percussion. Heart: Tachycardiac, irregular rhythm . Normal S1 and S2 without gallops or murmurs.  Abdomen: Bowel sounds are positive, abdomen soft and non-tender  Msk:  Back normal, normal gait. Normal strength and tone for age. Extremities: No clubbing, cyanosis or edema.   Neuro: Alert and oriented X 3. Psych:  Good affect, responds appropriately  Labs:   Lab Results  Component Value Date   WBC 12.7 (H) 05/22/2017   HGB 13.0 05/22/2017   HCT 39.2 (L) 05/22/2017   MCV 90.6 05/22/2017   PLT 222 05/22/2017    Recent Labs  Lab 05/22/17 0148  NA 137  K 3.7  CL 100*  CO2 25  BUN 15  CREATININE 1.01  CALCIUM 8.7*  PROT 7.5  BILITOT 2.0*  ALKPHOS 60  ALT 31  AST 43*  GLUCOSE 116*   Lab Results  Component Value Date   TROPONINI 0.04 (HH) 05/22/2017   No results found for: CHOL No results found for: HDL No results found for: LDLCALC No results found for: TRIG No results found for: CHOLHDL No results found for: LDLDIRECT    Radiology: Dg Chest Portable 1 View  Result Date: 05/22/2017 CLINICAL DATA:  Dyspnea and palpitations since Wednesday. Productive cough. EXAM: PORTABLE CHEST 1 VIEW COMPARISON:  Report from 02/01/2017 FINDINGS: Cardiomegaly with aortic atherosclerosis. Median sternotomy sutures with evidence of prior mitral valvular replacement. Patchy airspace opacities are  noted in both lower lobes suspicious for pneumonia. No acute osseous appearing abnormality. IMPRESSION: Pneumonic consolidations in both lower lobes. Followup PA and lateral chest X-ray is recommended in 3-4 weeks following trial of antibiotic therapy to ensure resolution and exclude underlying malignancy. Electronically Signed   By: Ashley Royalty M.D.   On: 05/22/2017 02:03    EKG: Atrial fibrillation with RVR  ASSESSMENT AND PLAN:  1. Afib with RVR   - History of chronic afib, RVR likely due to underlying pneumonia/sepsis  2. Pneumonia/Sepsis  - Elevated white count  - Bilateral infiltrates seen on CXR  Recommendations 1. Continue Pradaxa for stroke prevention  2. Continue Metoprolol 50mg  3. Continue Amiodarone drip; as patient improves, taper off  Signed: Doristine Mango PA-S  The history, physical exam findings, and plan were discussed with Dr. Saralyn Pilar and all decision making was made in collaboration.   05/22/2017, 1:43 PM

## 2017-05-22 NOTE — ED Triage Notes (Signed)
Patient ambulatory to triage with steady gait, without difficulty or distress noted; pt reports [facility], palpitations since Wednesday; st prod cough clear to brown sputum with fever; denies pain

## 2017-05-22 NOTE — ED Notes (Signed)
Pt placed on 2L of oxygen for [facility]

## 2017-05-22 NOTE — H&P (Signed)
Leslie Prince is an 72 y.o. male.   Chief Complaint: Shortness of breath HPI: The patient with past medical history of CHF and COPD presents to the emergency department complaining of shortness of breath.  The patient admits to cough and shortness of breath beginning 4 days ago.  He admits to not having much appetite but denies nausea, vomiting or diarrhea.  Emergency department the patient was found to have tachycardia as well as tachypnea.  Oxygen saturations were in the mid 80s on room air.  Chest x-ray showed potentially bibasilar pneumonia and the patient was started on broad-spectrum antibiotics after obtaining blood cultures.  His heart rate was also noted to be persistently greater than 120.  He was given diltiazem 50 mg but then developed mild hypotension which prompted the emergency department staff to call the hospitalist service for admission.  Past Medical History:  Diagnosis Date  . CHF (congestive heart failure) ([place])   . COPD (chronic obstructive pulmonary disease) ([place])   . Sleep apnea     Past Surgical History:  Procedure Laterality Date  . CARDIAC SURGERY    . CORONARY ANGIOPLASTY WITH STENT PLACEMENT      No family history on file. Social History:  reports that he quit smoking about 21 years ago. His smoking use included cigarettes. He has a 25.00 pack-year smoking history. he has never used smokeless tobacco. He reports that he drinks alcohol. His drug history is not on file.  Allergies:  Allergies  Allergen Reactions  . Lisinopril Other (See Comments)    Severe urination    Medications Prior to Admission  Medication Sig Dispense Refill  . albuterol (PROAIR HFA) 108 (90 Base) MCG/ACT inhaler Inhale 2 puffs into the lungs every 6 (six) hours as needed for wheezing.     Marland Kitchen albuterol (PROVENTIL) (2.5 MG/3ML) 0.083% nebulizer solution Take 2.5 mg by nebulization every 6 (six) hours as needed for wheezing.    Marland Kitchen aspirin EC 81 MG tablet Take 81 mg by mouth daily.     Marland Kitchen  atorvastatin (LIPITOR) 20 MG tablet Take 20 mg by mouth daily.  1  . Chromium 400 MCG TABS Take 1 tablet by mouth 2 (two) times daily.    . colchicine 0.6 MG tablet Take 0.6 mg by mouth as directed. Take 2 tablets at onset of gout flare, then 1 tablet one hour later    . dabigatran (PRADAXA) 150 MG CAPS capsule Take 150 mg by mouth 2 (two) times daily.     . enalapril (VASOTEC) 20 MG tablet Take 20 mg by mouth 2 (two) times daily.     . Fluticasone-Salmeterol (ADVAIR DISKUS) 250-50 MCG/DOSE AEPB Inhale 1 puff into the lungs 2 (two) times daily.     . furosemide (LASIX) 40 MG tablet Take 40 mg by mouth daily.    Marland Kitchen KLOR-CON M10 10 MEQ tablet Take 10 mEq by mouth 2 (two) times daily.   4  . metoprolol succinate (TOPROL-XL) 50 MG 24 hr tablet Take 50 mg by mouth daily.     . montelukast (SINGULAIR) 10 MG tablet TAKE 1 TABLET (10 MG TOTAL) BY MOUTH NIGHTLY.  3  . Multiple Vitamins tablet Take 1 tablet by mouth daily.     Marland Kitchen tiotropium (SPIRIVA) 18 MCG inhalation capsule Place 18 mcg into inhaler and inhale daily.    . vitamin C (ASCORBIC ACID) 500 MG tablet Take 500 mg by mouth daily.     . vitamin E 400 UNIT capsule Take 400  Units by mouth daily.       Results for orders placed or performed during the hospital encounter of 05/22/17 (from the past 48 hour(s))  Comprehensive metabolic panel     Status: Abnormal   Collection Time: 05/22/17  1:48 AM  Result Value Ref Range   Sodium 137 135 - 145 mmol/L   Potassium 3.7 3.5 - 5.1 mmol/L   Chloride 100 (L) 101 - 111 mmol/L   CO2 25 22 - 32 mmol/L   Glucose, Bld 116 (H) 65 - 99 mg/dL   BUN 15 6 - 20 mg/dL   Creatinine, Ser 1.01 0.61 - 1.24 mg/dL   Calcium 8.7 (L) 8.9 - 10.3 mg/dL   Total Protein 7.5 6.5 - 8.1 g/dL   Albumin 3.2 (L) 3.5 - 5.0 g/dL   AST 43 (H) 15 - 41 U/L   ALT 31 17 - 63 U/L   Alkaline Phosphatase 60 38 - 126 U/L   Total Bilirubin 2.0 (H) 0.3 - 1.2 mg/dL   GFR calc non Af Amer >60 >60 mL/min   GFR calc Af Amer >60 >60 mL/min     Comment: (NOTE) The eGFR has been calculated using the CKD EPI equation. This calculation has not been validated in all clinical situations. eGFR's persistently <60 mL/min signify possible Chronic Kidney Disease.    Anion gap 12 5 - 15    Comment: Performed at [facility], [address] [place], [place] [address]  CBC WITH DIFFERENTIAL     Status: Abnormal   Collection Time: 05/22/17  1:48 AM  Result Value Ref Range   WBC 12.7 (H) 3.8 - 10.6 K/uL   RBC 4.32 (L) 4.40 - 5.90 MIL/uL   Hemoglobin 13.0 13.0 - 18.0 g/dL   HCT 39.2 (L) 40.0 - 52.0 %   MCV 90.6 80.0 - 100.0 fL   MCH 30.0 26.0 - 34.0 pg   MCHC 33.1 32.0 - 36.0 g/dL   RDW 14.6 (H) 11.5 - 14.5 %   Platelets 222 150 - 440 K/uL   Neutrophils Relative % 88 %   Neutro Abs 11.1 (H) 1.4 - 6.5 K/uL   Lymphocytes Relative 4 %   Lymphs Abs 0.5 (L) 1.0 - 3.6 K/uL   Monocytes Relative 8 %   Monocytes Absolute 1.0 0.2 - 1.0 K/uL   Eosinophils Relative 0 %   Eosinophils Absolute 0.1 0 - 0.7 K/uL   Basophils Relative 0 %   Basophils Absolute 0.0 0 - 0.1 K/uL    Comment: Performed at [facility], [address] [place], [place] [address]  Lactic acid, plasma     Status: None   Collection Time: 05/22/17  1:48 AM  Result Value Ref Range   Lactic Acid, Venous 1.6 0.5 - 1.9 mmol/L    Comment: Performed at [facility], [address] [place], [place] [address]  Troponin I     Status: Abnormal   Collection Time: 05/22/17  1:48 AM  Result Value Ref Range   Troponin I 0.04 (HH) <0.03 ng/mL    Comment: CRITICAL RESULT CALLED TO, READ BACK BY AND VERIFIED WITH KASEY ROBERT ON 05/22/17 AT 0310 JAG Performed at [facility], [address] [place], [place] [address]   Brain natriuretic peptide     Status: Abnormal   Collection Time: 05/22/17  1:48 AM  Result Value Ref Range   B Natriuretic Peptide 317.0 (H) 0.0 - 100.0 pg/mL    Comment: Performed at [facility], [address], [place] [address]  Influenza panel by PCR (type A & B)     Status: None   Collection Time: 05/22/17  1:48 AM  Result Value Ref Range   Influenza A By PCR NEGATIVE NEGATIVE   Influenza B By PCR NEGATIVE NEGATIVE    Comment: (NOTE) The Xpert Xpress Flu assay is intended as an aid in the diagnosis of  influenza and should not be used as a sole basis for treatment.  This  assay is FDA approved for nasopharyngeal swab specimens only. Nasal  washings and aspirates are unacceptable for Xpert Xpress Flu testing. Performed at [facility], [address] [place], [place] [address]   TSH     Status: Abnormal   Collection Time: 05/22/17  1:48 AM  Result Value Ref Range   TSH 0.258 (L) 0.350 - 4.500 uIU/mL    Comment: Performed by a 3rd Generation assay with a functional sensitivity of <=0.01 uIU/mL. Performed at [facility], [address] [place], [place] [address]   Blood gas, venous ([facility], [facility], [facility])     Status: Abnormal   Collection Time: 05/22/17  2:02 AM  Result Value Ref Range   pH, Ven 7.44 (H) 7.250 - 7.430   pCO2, Ven 38 (L) 44.0 - 60.0 mmHg   pO2, Ven 42.0 32.0 - 45.0 mmHg   Bicarbonate 25.8 20.0 - 28.0 mmol/L   Acid-Base Excess 1.7 0.0 - 2.0 mmol/L   O2 Saturation 79.6 %   Patient temperature 37.0    Collection site VENOUS    Sample type VENOUS     Comment: Performed at [facility], [address] [place],  [address]   Dg Chest Portable 1 View  Result Date: 05/22/2017 CLINICAL DATA:  Dyspnea and palpitations since Wednesday. Productive cough. EXAM: PORTABLE CHEST 1 VIEW COMPARISON:  Report from 02/01/2017 FINDINGS: Cardiomegaly with aortic atherosclerosis. Median sternotomy sutures with evidence of prior mitral valvular replacement. Patchy airspace opacities are noted in both lower lobes suspicious for pneumonia. No acute osseous appearing abnormality. IMPRESSION: Pneumonic consolidations in both lower lobes. Followup PA and lateral  chest X-ray is recommended in 3-4 weeks following trial of antibiotic therapy to ensure resolution and exclude underlying malignancy. Electronically Signed   By: Ashley Royalty M.D.   On: 05/22/2017 02:03    Review of Systems  Constitutional: Negative for chills and fever.  HENT: Negative for sore throat and tinnitus.   Eyes: Negative for blurred vision and redness.  Respiratory: Positive for cough and shortness of breath.   Cardiovascular: Positive for palpitations. Negative for chest pain, orthopnea and PND.  Gastrointestinal: Negative for abdominal pain, diarrhea, nausea and vomiting.  Genitourinary: Negative for dysuria, frequency and urgency.  Musculoskeletal: Negative for joint pain and myalgias.  Skin: Negative for rash.       No lesions  Neurological: Negative for speech change, focal weakness and weakness.  Endo/Heme/Allergies: Does not bruise/bleed easily.       No temperature intolerance  Psychiatric/Behavioral: Negative for depression and suicidal ideas.    Blood pressure 104/66, pulse (!) 120, temperature 98.6 F (37 C), temperature source Oral, resp. rate 18, height '5\' 10"'$  (1.778 m), weight 130.6 kg (288 lb), SpO2 96 %. Physical Exam  Nursing note and vitals reviewed. Constitutional: He is oriented to person, place, and time. He appears well-developed and well-nourished. No distress. Nasal cannula in place.  HENT:  Head: Normocephalic and atraumatic.  Mouth/Throat: Oropharynx is clear and moist.  Eyes: Conjunctivae and EOM are normal. Pupils are equal, round, and reactive to light. No scleral icterus.  Neck: Normal range of motion. Neck supple. No JVD present. No tracheal deviation present. No thyromegaly present.  Cardiovascular: Normal heart sounds. An irregularly irregular rhythm present. Tachycardia present. Exam reveals no gallop and no friction rub.  No murmur heard. Respiratory: Effort normal. No respiratory distress. He has decreased breath sounds in the right lower  field and the left lower field.  GI: Soft. Bowel sounds are normal. He exhibits no distension. There is no tenderness.  Genitourinary:  Genitourinary Comments: Deferred  Musculoskeletal: Normal range of motion. He exhibits no edema.  Lymphadenopathy:    He has no cervical adenopathy.  Neurological: He is alert and oriented to person, place, and time. No cranial nerve deficit.  Skin: Skin is warm and dry. No rash noted. No erythema.  Psychiatric: He has a normal mood and affect. His behavior is normal. Judgment and thought content normal.     Assessment/Plan This is a 72 year old male admitted for atrial fibrillation with rapid ventricular rate. 1.  Atrial fibrillation: With RVR; stable but with persistent tachycardia.  I started the patient on amiodarone drip to control his heart rate.  Continue Pradaxa and aspirin for anticoagulation.  Monitor telemetry.  Consult cardiology. 2.  Pneumonia: Community-acquired; continue ceftriaxone and azithromycin.  Supplemental oxygen as needed. 3.  Sepsis: The patient meets all 4 criteria for sepsis.  He is hemodynamically stable.  Follow blood cultures for growth and sensitivities. 4.  CHF: Diastolic; chronic.  The patient is tolerated fluid boluses well.  Continue lisinopril and metoprolol. 5.  COPD: Consider addition of steroids.  Also continue Spiriva, inhaled corticosteroid and albuterol as needed. 6.  DVT prophylaxis: As above 7.  GI prophylaxis: None The patient is a full code.  Time spent on admission orders and critical care approximately 45 minutes.  Discussed with [facility] telemedicine  [name], MD 05/22/2017, 6:42 AM

## 2017-05-23 ENCOUNTER — Inpatient Hospital Stay: Payer: Medicare Other

## 2017-05-23 ENCOUNTER — Other Ambulatory Visit: Payer: Self-pay

## 2017-05-23 LAB — T4, FREE: FREE T4: 1.29 ng/dL — AB (ref 0.61–1.12)

## 2017-05-23 MED ORDER — BISACODYL 5 MG PO TBEC
10.0000 mg | DELAYED_RELEASE_TABLET | Freq: Every day | ORAL | Status: DC
  Administered 2017-05-23 – 2017-05-24 (×2): 10 mg via ORAL
  Filled 2017-05-23 (×3): qty 2

## 2017-05-23 NOTE — Progress Notes (Signed)
Pt with complainants of feeling constipated, pt did have BM yesterday 2/12, but still feeling very uncomfortable, MD paged, Dr. Benjie Karvonen to put in orders for PO dulcolax.   Conley Simmonds, RN, BSN

## 2017-05-23 NOTE — Progress Notes (Signed)
[facility] Cardiology  SUBJECTIVE: Leslie Prince is a 72 year old male who was admitted to the hospital for pneumonia/sepsis. Cardiology was consulted because patient was in atrial fibrillation with RVR. Today, patient is sitting up in bed and reports he is doing better. Admits to shortness of breath with minimal exertion. Denies chest pain, palpitations, or peripheral edema.    Vitals:   05/22/17 2303 05/23/17 0351 05/23/17 0448 05/23/17 0818  BP: 130/83 118/68  126/85  Pulse: (!) 110 (!) 103  (!) 103  Resp: 17 18    Temp: 98 F (36.7 C) 98.1 F (36.7 C)  (!) 97.5 F (36.4 C)  TempSrc: Oral Oral  Oral  SpO2: 96% 93%  94%  Weight:   131.8 kg (290 lb 9.6 oz)   Height:         Intake/Output Summary (Last 24 hours) at 05/23/2017 0858 Last data filed at 05/23/2017 0700 Gross per 24 hour  Intake 1160.94 ml  Output 800 ml  Net 360.94 ml      PHYSICAL EXAM  General: Well developed, well nourished, in no acute distress HEENT:  Normocephalic and atramatic Neck:  No JVD.  Lungs: Clear bilaterally to auscultation and percussion. Heart: Tachycardiac, irregular rhythm. No evidence of gallops or murmurs.  Abdomen: Bowel sounds are positive, abdomen soft and non-tender  Msk:  Back normal. Normal strength and tone for age. Gait not assessed Extremities: No clubbing, cyanosis or edema.   Neuro: Alert and oriented X 3. Psych:  Good affect, responds appropriately   LABS: Basic Metabolic Panel: Recent Labs    05/22/17 0148  NA 137  K 3.7  CL 100*  CO2 25  GLUCOSE 116*  BUN 15  CREATININE 1.01  CALCIUM 8.7*   Liver Function Tests: Recent Labs    05/22/17 0148  AST 43*  ALT 31  ALKPHOS 60  BILITOT 2.0*  PROT 7.5  ALBUMIN 3.2*   No results for input(s): LIPASE, AMYLASE in the last 72 hours. CBC: Recent Labs    05/22/17 0148  WBC 12.7*  NEUTROABS 11.1*  HGB 13.0  HCT 39.2*  MCV 90.6  PLT 222   Cardiac Enzymes: Recent Labs    05/22/17 0148  TROPONINI 0.04*    BNP: Invalid input(s): POCBNP D-Dimer: No results for input(s): DDIMER in the last 72 hours. Hemoglobin A1C: No results for input(s): HGBA1C in the last 72 hours. Fasting Lipid Panel: No results for input(s): CHOL, HDL, LDLCALC, TRIG, CHOLHDL, LDLDIRECT in the last 72 hours. Thyroid Function Tests: Recent Labs    05/22/17 0148  TSH 0.258*   Anemia Panel: No results for input(s): VITAMINB12, FOLATE, FERRITIN, TIBC, IRON, RETICCTPCT in the last 72 hours.  Dg Chest Portable 1 View  Result Date: 05/22/2017 CLINICAL DATA:  Dyspnea and palpitations since Wednesday. Productive cough. EXAM: PORTABLE CHEST 1 VIEW COMPARISON:  Report from 02/01/2017 FINDINGS: Cardiomegaly with aortic atherosclerosis. Median sternotomy sutures with evidence of prior mitral valvular replacement. Patchy airspace opacities are noted in both lower lobes suspicious for pneumonia. No acute osseous appearing abnormality. IMPRESSION: Pneumonic consolidations in both lower lobes. Followup PA and lateral chest X-ray is recommended in 3-4 weeks following trial of antibiotic therapy to ensure resolution and exclude underlying malignancy. Electronically Signed   By: Ashley Royalty M.D.   On: 05/22/2017 02:03     TELEMETRY: Atrial fibrillation with RVR, rate better controlled  ASSESSMENT AND PLAN: 1. Afib with RVR  - Rate has improved  - History of chronic atrial fibrillation  2. Pneumonia/Sepsis   - Bilateral infiltrates seen on CXR 3. Congestive heart failure   - Chronic, diastolic   Recommendations:  1. Agree with current medication regimen for pneumonia/sepsis 2. Continue Pradaxa for stroke prevention  3. Continue Metoprolol 50mg  4. Continue Amiodarone drip for today, likely taper off tomorrow; defer long term use of Amiodarone  5. Continue Lisinopril, Metoprolol for diastolic CHF 6. Follow up outpatient Dr. Saralyn Pilar in 1 week   Active Problems:   Atrial fibrillation with RVR ([place])     Doristine Mango,  PA-S  This history, physical exam findings, and plan were discussed with Dr. Saralyn Pilar and Clabe Seal, PA-C and all decision making was made in collaboration.   05/23/2017 8:58 AM

## 2017-05-23 NOTE — Progress Notes (Signed)
[place] [facility] [place] NAME: Leslie Prince    MR#:  [id number]  DATE OF BIRTH:  May 09, 1945  SUBJECTIVE:  CHIEF COMPLAINT:   Chief Complaint  Patient presents with  . Shortness of Breath  Somewhat better although still tachycardic and short of breath and does not want to rush out of the hospital REVIEW OF SYSTEMS:  Review of Systems  Constitutional: Positive for malaise/fatigue. Negative for chills, fever and weight loss.  HENT: Negative for nosebleeds and sore throat.   Eyes: Negative for blurred vision.  Respiratory: Positive for shortness of breath. Negative for cough and wheezing.   Cardiovascular: Positive for palpitations. Negative for chest pain, orthopnea, leg swelling and PND.  Gastrointestinal: Negative for abdominal pain, constipation, diarrhea, heartburn, nausea and vomiting.  Genitourinary: Negative for dysuria and urgency.  Musculoskeletal: Negative for back pain.  Skin: Negative for rash.  Neurological: Negative for dizziness, speech change, focal weakness and headaches.  Endo/Heme/Allergies: Does not bruise/bleed easily.  Psychiatric/Behavioral: Negative for depression.    DRUG ALLERGIES:   Allergies  Allergen Reactions  . Lisinopril Other (See Comments)    Severe urination   VITALS:  Blood pressure 126/85, pulse (!) 103, temperature (!) 97.5 F (36.4 C), temperature source Oral, resp. rate 18, height 5\' 10"  (1.778 m), weight 131.8 kg (290 lb 9.6 oz), SpO2 94 %. PHYSICAL EXAMINATION:  Physical Exam  Constitutional: He is oriented to person, place, and time and well-developed, well-nourished, and in no distress.  HENT:  Head: Normocephalic and atraumatic.  Eyes: Conjunctivae and EOM are normal. Pupils are equal, round, and reactive to light.  Neck: Normal range of motion. Neck supple. No tracheal deviation present. No thyromegaly present.  Cardiovascular: Normal rate, regular rhythm and normal heart sounds.    Pulmonary/Chest: Effort normal and breath sounds normal. No respiratory distress. He has no wheezes. He exhibits no tenderness.  Abdominal: Soft. Bowel sounds are normal. He exhibits no distension. There is no tenderness.  Musculoskeletal: Normal range of motion.  Neurological: He is alert and oriented to person, place, and time. No cranial nerve deficit.  Skin: Skin is warm and dry. No rash noted.  Psychiatric: Mood and affect normal.   LABORATORY PANEL:  Male CBC Recent Labs  Lab 05/22/17 0148  WBC 12.7*  HGB 13.0  HCT 39.2*  PLT 222   ------------------------------------------------------------------------------------------------------------------ Chemistries  Recent Labs  Lab 05/22/17 0148  NA 137  K 3.7  CL 100*  CO2 25  GLUCOSE 116*  BUN 15  CREATININE 1.01  CALCIUM 8.7*  AST 43*  ALT 31  ALKPHOS 60  BILITOT 2.0*   RADIOLOGY:  Ct Chest Wo Contrast  Result Date: 05/23/2017 CLINICAL DATA:  Shortness of Breath and cough.  Fever. EXAM: CT CHEST WITHOUT CONTRAST TECHNIQUE: Multidetector CT imaging of the chest was performed following the standard protocol without IV contrast. COMPARISON:  Chest radiograph May 22, 2017 FINDINGS: Cardiovascular: Ascending thoracic aortic diameter is 4.1 x 4.0 cm. There are scattered foci of calcification in the proximal right common carotid artery. Other visualized great vessels appear unremarkable. There are foci of atherosclerotic calcification in the aorta. There are foci of coronary artery calcification at multiple sites. There is no pericardial effusion or pericardial thickening. Patient is status post mitral valve replacement. There is left atrial enlargement. Mediastinum/Nodes: Thyroid appears normal. There are multiple borderline prominent thoracic/mediastinal lymph nodes. Several lymph nodes surrounding the trachea have a short axis  diameter between 1.0 and 1.2 cm. Largest lymph node is anterior to the carina measuring 1.7 x 1.2  cm. No esophageal lesions are evident. Lungs/Pleura: There is a focal pleural effusion on the left with a smaller pleural effusion on the right. Both of these effusions appear free-flowing. There is airspace consolidation throughout much of the superior and lateral segments of the right lower lobe. In addition to airspace consolidation more centrally in these areas, there is peripheral tree on but appearance in these areas of the right lower lobe. There are foci of airspace consolidation throughout much of the left lower lobe, with the greatest degree of consolidation in the anterior segment. There is also consolidation in the inferior lingula. There is mild underlying centrilobular emphysematous change. Upper Abdomen: There is aortic and proximal mesenteric arterial atherosclerotic change in the upper abdomen. Visualized upper abdominal structures otherwise appear unremarkable. Musculoskeletal: Patient is status post median sternotomy. There is multifocal degenerative change in the thoracic spine. There are no blastic or lytic bone lesions. IMPRESSION: 1. Multifocal pneumonia, involving much of both lower lobes as well as the inferior lingula. Mild underlying centrilobular emphysematous change. Bilateral pleural effusions, larger on the left than on the right. 2. Multiple mildly enlarged lymph nodes throughout the mediastinum, mainly surrounding the trachea and upper carina. 3. Prominence of the ascending thoracic aorta measuring 4.1 x 4.0 cm. Recommend annual imaging followup by CTA or MRA. This recommendation follows 2010 ACCF/AHA/AATS/ACR/ASA/SCA/SCAI/SIR/STS/SVM Guidelines for the Diagnosis and Management of Patients with Thoracic Aortic Disease. Circulation. 2010; 121: F681-E751. 4. Aortic atherosclerosis. Multiple foci of coronary artery calcification. Scattered foci of mesenteric and great vessel calcification noted. 5. Status post mitral valve replacement. Prominence of the left atrium. Question history of  mitral stenosis. Aortic Atherosclerosis (ICD10-I70.0) and Emphysema (ICD10-J43.9). Electronically Signed   By: Lowella Grip III M.D.   On: 05/23/2017 14:54   ASSESSMENT AND PLAN:  This is a 72 year old male admitted for atrial fibrillation with rapid ventricular rate.  1.  Chronicarial fibrillation: With RVR;   likely due to underlying pneumonia/sepsis/hyperthyroidism -Continue amiodarone drip to control his heart rate. Continue Pradaxa and aspirin for anticoagulation.  -Monitor telemetry. 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 Pneumonia: Community-acquired; continue ceftriaxone and azithromycin. Supplemental oxygen as needed. 3. Sepsis: Present on admission. so Far all the cultures are negative 4. CHF: Diastolic; chronic -Continue lisinopril and metoprolol. 5. COPD: continue Spiriva, inhaled corticosteroid and albuterol as needed. 6. DVT prophylaxis: As above 7. GI prophylaxis: None       All the records are reviewed and case discussed with Care Management/Social Worker. Management plans discussed with the patient, family (wife at bedside) and they are in agreement.  CODE STATUS: Full Code  TOTAL TIME TAKING CARE OF THIS PATIENT: 35 minutes.   More than 50% of the time was spent in counseling/coordination of care: YES  POSSIBLE D/C IN 1-2 DAYS, DEPENDING ON CLINICAL CONDITION.   Max Sane M.D on 05/23/2017 at 4:38 PM  Between 7am to 6pm - Pager - [phone]  After 6pm go to [email or web address] - Proofreader  [facility]  Office  [phone]  CC: Primary care physician; [name], MD  Note: This dictation was prepared with Dragon dictation along with smaller phrase technology. Any transcriptional errors that result from this process are unintentional.

## 2017-05-24 ENCOUNTER — Encounter: Payer: Self-pay | Admitting: *Deleted

## 2017-05-24 ENCOUNTER — Encounter
Admission: EM | Disposition: A | Discharge status: Home Health | Payer: Self-pay | Source: Home / Self Care | Attending: Internal Medicine

## 2017-05-24 LAB — BASIC METABOLIC PANEL
Anion gap: 8 (ref 5–15)
BUN: 11 mg/dL (ref 6–20)
CALCIUM: 8.6 mg/dL — AB (ref 8.9–10.3)
CO2: 26 mmol/L (ref 22–32)
CREATININE: 0.89 mg/dL (ref 0.61–1.24)
Chloride: 105 mmol/L (ref 101–111)
GFR calc Af Amer: >60 mL/min (ref >60)
GLUCOSE: 91 mg/dL (ref 65–99)
Potassium: 3.6 mmol/L (ref 3.5–5.1)
Sodium: 139 mmol/L (ref 135–145)

## 2017-05-24 LAB — CBC
HCT: 33.8 % — ABNORMAL LOW (ref 40.0–52.0)
Hemoglobin: 11.5 g/dL — ABNORMAL LOW (ref 13.0–18.0)
MCH: 30.9 pg (ref 26.0–34.0)
MCHC: 34.1 g/dL (ref 32.0–36.0)
MCV: 90.7 fL (ref 80.0–100.0)
PLATELETS: 215 10*3/uL (ref 150–440)
RBC: 3.73 MIL/uL — ABNORMAL LOW (ref 4.40–5.90)
RDW: 14.6 % — AB (ref 11.5–14.5)
WBC: 7.3 10*3/uL (ref 3.8–10.6)

## 2017-05-24 LAB — T3, FREE: T3, Free: 2.5 pg/mL (ref 2.0–4.4)

## 2017-05-24 SURGERY — LEFT HEART CATH AND CORONARY ANGIOGRAPHY
Anesthesia: Moderate Sedation | Laterality: Left

## 2017-05-24 MED ORDER — COLCHICINE 0.6 MG PO TABS
1.2000 mg | ORAL_TABLET | Freq: Two times a day (BID) | ORAL | Status: DC | PRN
  Administered 2017-05-25: 1.2 mg via ORAL
  Filled 2017-05-24: qty 2

## 2017-05-24 NOTE — Progress Notes (Signed)
Discussed CT findings with him over phone and went over any questions. Unable to see him in person today. Patient is feeling better. Also discussed thyroid test results.

## 2017-05-24 NOTE — Progress Notes (Signed)
[facility] Cardiology  SUBJECTIVE: Leslie Prince is a 72 year old male who was admitted to the hospital for pneumonia/sepsis. Cardiology was consulted because patient was noted to be in atrial fibrillation with RVR. Today, patient is sitting up in bed and states that he feels the same as he did yesterday. He denies chest pain. He admits to severe shortness of breath with any exertion. He denies chest pain or peripheral edema.     Vitals:   05/23/17 1720 05/23/17 1911 05/24/17 0323 05/24/17 0713  BP: 132/81 104/74 (!) 142/91 119/72  Pulse: 97 (!) 110 99 94  Resp: 18 18 17 19   Temp: 98.2 F (36.8 C)  97.9 F (36.6 C) 98.2 F (36.8 C)  TempSrc: Oral  Oral   SpO2: 96% 97% 91% (!) 88%  Weight:   131.1 kg (289 lb 1.6 oz)   Height:         Intake/Output Summary (Last 24 hours) at 05/24/2017 5277 Last data filed at 05/24/2017 0841 Gross per 24 hour  Intake 1325.73 ml  Output 1500 ml  Net -174.27 ml      PHYSICAL EXAM  General: Well developed, well nourished, in no acute distress HEENT:  Normocephalic and atramatic Neck:  No JVD.  Lungs: Clear bilaterally to auscultation and percussion. Heart: Irregular rate and rhythm, consistent with afib. No gallops or murmurs.  Abdomen: Bowel sounds are positive, abdomen soft and non-tender  Msk:  Back normal. Normal strength and tone for age. Gait not assessed  Extremities: No clubbing, cyanosis or edema.   Neuro: Alert and oriented X 3. Psych:  Good affect, responds appropriately   LABS: Basic Metabolic Panel: Recent Labs    05/22/17 0148 05/24/17 0613  NA 137 139  K 3.7 3.6  CL 100* 105  CO2 25 26  GLUCOSE 116* 91  BUN 15 11  CREATININE 1.01 0.89  CALCIUM 8.7* 8.6*   Liver Function Tests: Recent Labs    05/22/17 0148  AST 43*  ALT 31  ALKPHOS 60  BILITOT 2.0*  PROT 7.5  ALBUMIN 3.2*   No results for input(s): LIPASE, AMYLASE in the last 72 hours. CBC: Recent Labs    05/22/17 0148 05/24/17 0613  WBC 12.7* 7.3  NEUTROABS  11.1*  --   HGB 13.0 11.5*  HCT 39.2* 33.8*  MCV 90.6 90.7  PLT 222 215   Cardiac Enzymes: Recent Labs    05/22/17 0148  TROPONINI 0.04*   BNP: Invalid input(s): POCBNP D-Dimer: No results for input(s): DDIMER in the last 72 hours. Hemoglobin A1C: No results for input(s): HGBA1C in the last 72 hours. Fasting Lipid Panel: No results for input(s): CHOL, HDL, LDLCALC, TRIG, CHOLHDL, LDLDIRECT in the last 72 hours. Thyroid Function Tests: Recent Labs    05/22/17 0148  TSH 0.258*   Anemia Panel: No results for input(s): VITAMINB12, FOLATE, FERRITIN, TIBC, IRON, RETICCTPCT in the last 72 hours.  Ct Chest Wo Contrast  Result Date: 05/23/2017 CLINICAL DATA:  Shortness of Breath and cough.  Fever. EXAM: CT CHEST WITHOUT CONTRAST TECHNIQUE: Multidetector CT imaging of the chest was performed following the standard protocol without IV contrast. COMPARISON:  Chest radiograph May 22, 2017 FINDINGS: Cardiovascular: Ascending thoracic aortic diameter is 4.1 x 4.0 cm. There are scattered foci of calcification in the proximal right common carotid artery. Other visualized great vessels appear unremarkable. There are foci of atherosclerotic calcification in the aorta. There are foci of coronary artery calcification at multiple sites. There is no pericardial effusion or  pericardial thickening. Patient is status post mitral valve replacement. There is left atrial enlargement. Mediastinum/Nodes: Thyroid appears normal. There are multiple borderline prominent thoracic/mediastinal lymph nodes. Several lymph nodes surrounding the trachea have a short axis diameter between 1.0 and 1.2 cm. Largest lymph node is anterior to the carina measuring 1.7 x 1.2 cm. No esophageal lesions are evident. Lungs/Pleura: There is a focal pleural effusion on the left with a smaller pleural effusion on the right. Both of these effusions appear free-flowing. There is airspace consolidation throughout much of the superior and  lateral segments of the right lower lobe. In addition to airspace consolidation more centrally in these areas, there is peripheral tree on but appearance in these areas of the right lower lobe. There are foci of airspace consolidation throughout much of the left lower lobe, with the greatest degree of consolidation in the anterior segment. There is also consolidation in the inferior lingula. There is mild underlying centrilobular emphysematous change. Upper Abdomen: There is aortic and proximal mesenteric arterial atherosclerotic change in the upper abdomen. Visualized upper abdominal structures otherwise appear unremarkable. Musculoskeletal: Patient is status post median sternotomy. There is multifocal degenerative change in the thoracic spine. There are no blastic or lytic bone lesions. IMPRESSION: 1. Multifocal pneumonia, involving much of both lower lobes as well as the inferior lingula. Mild underlying centrilobular emphysematous change. Bilateral pleural effusions, larger on the left than on the right. 2. Multiple mildly enlarged lymph nodes throughout the mediastinum, mainly surrounding the trachea and upper carina. 3. Prominence of the ascending thoracic aorta measuring 4.1 x 4.0 cm. Recommend annual imaging followup by CTA or MRA. This recommendation follows 2010 ACCF/AHA/AATS/ACR/ASA/SCA/SCAI/SIR/STS/SVM Guidelines for the Diagnosis and Management of Patients with Thoracic Aortic Disease. Circulation. 2010; 121: Z169-C789. 4. Aortic atherosclerosis. Multiple foci of coronary artery calcification. Scattered foci of mesenteric and great vessel calcification noted. 5. Status post mitral valve replacement. Prominence of the left atrium. Question history of mitral stenosis. Aortic Atherosclerosis (ICD10-I70.0) and Emphysema (ICD10-J43.9). Electronically Signed   By: Lowella Grip III M.D.   On: 05/23/2017 14:54       TELEMETRY: Atrial fibrillation, rate controlled (upper 90s)   ASSESSMENT AND  PLAN: 1. Atrial fibrillation with RVR  -Rate better controlled, upper 90s  - History of chronic atrial fibrillation on Pradaxa  2. Pneumonia/Sepsis   - Bilateral infiltrates seen on CXR 3. Congestive heart failure   - Chronic, diastolic   Recommendations:  1. Agree with current medication regimen for pneumonia/sepsis 2. Continue Pradaxa for stroke prevention  3. Taper Amiodarone drip to off 4. Continue Metoprolol 50mg  5. Continue Lisinopril, Metoprolol for diastolic CHF 6. Follow up outpatient cardiology with Dr. Saralyn Pilar in 1 week  Active Problems:   Atrial fibrillation with RVR ([place])     Doristine Mango, PA-S  The history, physical exam findings, and plan were discussed with Dr. Saralyn Pilar and Clabe Seal, PA-C and all decision making was made in collaboration.   05/24/2017 8:52 AM

## 2017-05-24 NOTE — Progress Notes (Addendum)
72 year old male admitted for atrial fibrillation with RVR.  Patient's active problem list this admission: 1. Chronic Atrial Fib with RVR likely due to underlying pna, sepsis, hyperthyroidism.  Patient off amiodarone gtt.  Patient on Pradaxa, aspirin and metoprolol.   2. Suspected hyperthyroidisn with elevated free T4 and low TSH.  Plan is for outpatient endocrinology evaluation.   3.CAP - antibiotics and supplemental oxygen at this time.  Patient was NOT on home oxygen as prior to admission.   4. CHF - chronic diastolic - on lisinopril and metoprolol 5. COPD - on spiriva, inhaled corticosteroid,albuterol.     CHF Education:?? Educational session with patient, wife and daughter completed.  Provided patient with "Living Better with Heart Failure" packet. Briefly reviewed definition of heart failure and signs and symptoms of an exacerbation.?Discussed the meaning of EF and his preliminary value as compared to normal value.  Explained to patient that HF is a chronic illness which requires self-assessment / self-management along with help from the cardiologist/PCP/HF Clinic. ??? *Reviewed importance of and reason behind checking weight daily in the AM, after using the bathroom, but before getting dressed.?Patient has scales.  Patient has not been weighing himself.    Reviewed the following information with patient:  *Discussed when to call the Dr= weight gain of >2-3lb overnight of 5lb in a week,  *Discussed yellow zone= call MD: weight gain of >2-3lb overnight of 5lb in a week, increased swelling, increased SOB when lying down, chest discomfort, dizziness, increased fatigue *Red Zone= call 911: struggle to breath, fainting or near fainting, significant chest pain   *Reviewed low sodium diet-provided handout of recommended and not recommended foods. ?Reviewed reading labels with patient. Discussed fluid intake with patient as well. Patient not currently on a fluid restriction, but advised no more than  8-8 ounces glass of fluids per day.  Dietitian Consultation entered for education on low sodium heart failure diet.      *Instructed patient to take medications as prescribed for heart failure. Explained briefly why pt is on the medications (either make you feel better, live longer or keep you out of the hospital) and discussed monitoring and side effects.   *Smoking Cessation - Patient is a former smoker.  Patient informed this RN that he quit smoking approximately 21 years ago.  ?? *Discussed the benefits of exercise.   Explained to patient and family that based on his EF per Medicare guidelines he is a NOT candidate for Cardiac Rehab, but is a candidate for [facility] (Pulmonary Re[facility]).  Overview of [facility] provided.  Patient will discuss with Dr. Saralyn Pilar upon his follow-up post discharge visit about [place].  In the meantime patient encouraged to remain as active as possible.    *[facility]- Explained the role of the [facility]. ?Explained to patient and significant other that the HF Clinic does not replace his PCP/cardiologist, but is an additional resource to help him manage his HF and to keep him out of the hospital. Patient and family would like to discuss this with Dr. Saralyn Pilar before making an appointment in the HF Clinic.    ? Again, the 5 Steps to Living Better with Heart Failure were reviewed with patient and family.  Patient and family thanked me for providing the above information.   ? Roanna Epley, RN, BSN, [facility] Cardiovascular and Pulmonary Nurse Navigator

## 2017-05-25 MED ORDER — AMOXICILLIN-POT CLAVULANATE 875-125 MG PO TABS: 1.0000 | ORAL_TABLET | Freq: Two times a day (BID) | ORAL | 0 refills | Status: AC

## 2017-05-25 NOTE — Care Management Important Message (Signed)
Important Message  Patient Details  Name: Leslie Prince MRN: [id number] Date of Birth: 02/03/1946   Medicare Important Message Given:  Yes  Signed IM notice given   Katrina Stack, RN 05/25/2017, 1:33 PM

## 2017-05-25 NOTE — Progress Notes (Signed)
SATURATION QUALIFICATIONS: (This note is used to comply with regulatory documentation for home oxygen)  Patient Saturations on Room Air at Rest = 97%  Patient Saturations on Room Air while Ambulating = 90%  Patient Saturations on n/a Liters of oxygen while Ambulating = n/a%  Please briefly explain why patient needs home oxygen: 

## 2017-05-25 NOTE — Progress Notes (Signed)
PT Cancellation Note  Patient Details Name: Leslie Prince MRN: [id number] DOB: [birth year]   Cancelled Treatment:    Reason Eval/Treat Not Completed: Other (comment). Consult received and chart reviewed. Pt observed ambulating hallway x 2 laps with RN staff. No AD used with safe technique. Screen performed by therapist at end of ambulation. Pt reports he was SOB, however O2 sats WNL. Discussed possible AD, pt not interested at this time. Pt has adequate support at home and safe living arrangement. Pt reports no PT needs at this time. Due to difficulty with endurance, recommending [facility] program. Will sign off at this time.   Cresencia Asmus 05/25/2017, 10:50 AM Greggory Stallion, PT, DPT [phone]

## 2017-05-25 NOTE — Care Management (Signed)
Patient discharging home and is agreeable to have home health nurse to follow.  No agency preference. Referral to [facility] and attending has ordered the HF titration Protocol.  Patient has not qualified for home oxygen.  He does have chronic home cpap.  Has referral to [facility]

## 2017-05-25 NOTE — Progress Notes (Signed)
IV and tele removed from patient. Discharge instructions given to patient. Verbalized understanding. No distress at this time. Wife at bedside and will transport patient home.

## 2017-05-25 NOTE — Discharge Instructions (Signed)
Heart Failure and Exercise °Heart failure is a condition in which the heart does not fill or pump enough blood and oxygen to support your body and its functions. Heart failure is a long-term (chronic) condition. Living with heart failure can be challenging. However, following your health care provider's instructions about a healthy lifestyle may help improve your symptoms. This includes choosing the right exercise plan. °Doing daily physical activity is important after a diagnosis of heart failure. You may have some activity restrictions, so talk to your health care provider before doing any exercises. °What are the benefits of exercise? °Exercise may: °· Make your heart muscles stronger. °· Lower your blood pressure. °· Lower your cholesterol. °· Help you lose weight. °· Help your bones stay strong. °· Improve your blood circulation. °· Help your body use oxygen better. This relieves symptoms such as fatigue and shortness of breath. °· Help your mental health by lowering the risk of depression and other problems. °· Improve your quality of life. °· Decrease your chance of hospital admission for heart failure. ° °What is an exercise plan? °An exercise plan is a set of specific exercises and training activities. You will work with your health care provider to create the exercise plan that works for you. The plan may include: °· Different types of exercises and how to do them. °· Cardiac rehabilitation exercises. These are supervised programs that are designed to strengthen your heart. ° °What are strengthening exercises? °Strengthening exercises are a type of physical activity that involves using resistance to improve your muscle strength. Strengthening exercises usually have repetitive motions. These types of exercises can include: °· Lifting weights. °· Using weight machines. °· Using resistance tubes and bands. °· Using kettlebells. °· Using your body weight, such as doing push-ups or squats. ° °What are balance  exercises? °Balance exercises are another type of physical activity. They strengthen the muscles of the back, stomach, and pelvis (core muscles) and improve your balance. They can also lower your risk of falling. These types of exercises can include: °· Standing on one leg. °· Walking backward, sideways, and in a straight line. °· Standing up after sitting, without using your hands. °· Shifting your weight from one leg to the other. °· Lifting one leg in front of you. °· Doing tai chi. This is a type of exercise that uses slow movements and deep breathing. ° °How can I increase my flexibility? °Having better flexibility can keep you from falling. It can also lengthen your muscles, improve your range of motion, and help your joints. You can increase your flexibility by: °· Doing tai chi. °· Doing yoga. °· Stretching. ° °How much aerobic exercise should I get? °Aerobic exercises strengthen your breathing and circulation system and increase your body's use of oxygen. Examples of aerobic exercise include biking, walking, running, and swimming. Talk to your health care provider to find out how much aerobic exercise is safe for you.  °To do these exercises: °· Start exercising slowly, limiting the amount of time at first. You may need to start with 5 minutes of aerobic exercise every day. °· Slowly add more minutes until you can safely do at least 30 minutes of exercise at least 4 days a week. ° °Summary °· Daily physical activity is important after a diagnosis of heart failure. °· Exercise can make your heart muscles stronger. It also offers other benefits that will improve your health. °· Talk to your health care provider before doing any exercises. °This information is   not intended to replace advice given to you by your health care provider. Make sure you discuss any questions you have with your health care provider. °Document Released: 08/08/2016 Document Revised: 08/08/2016 Document Reviewed: 08/08/2016 °Elsevier  Interactive Patient Education © 2018 [employer] ° °

## 2017-05-25 NOTE — Plan of Care (Signed)
  Progressing Education: Knowledge of General Education information will improve 05/25/2017 0027 - Progressing by Marylouise Stacks, RN Clinical Measurements: Ability to maintain clinical measurements within normal limits will improve 05/25/2017 0027 - Progressing by Marylouise Stacks, RN Will remain free from infection 05/25/2017 0027 - Progressing by Marylouise Stacks, RN Diagnostic test results will improve 05/25/2017 0027 - Progressing by Marylouise Stacks, RN Respiratory complications will improve 05/25/2017 0027 - Progressing by Marylouise Stacks, RN Cardiovascular complication will be avoided 05/25/2017 0027 - Progressing by Marylouise Stacks, RN Safety: Ability to remain free from injury will improve 05/25/2017 0027 - Progressing by Marylouise Stacks, RN Education: Knowledge of disease or condition will improve 05/25/2017 0027 - Progressing by Marylouise Stacks, RN Understanding of medication regimen will improve 05/25/2017 0027 - Progressing by Marylouise Stacks, RN Activity: Ability to tolerate increased activity will improve 05/25/2017 0027 - Progressing by Marylouise Stacks, RN Cardiac: Ability to achieve and maintain adequate cardiopulmonary perfusion will improve 05/25/2017 0027 - Progressing by Marylouise Stacks, RN

## 2017-05-27 LAB — CULTURE, BLOOD (ROUTINE X 2)
CULTURE: NO GROWTH
Culture: NO GROWTH
SPECIAL REQUESTS: ADEQUATE
Special Requests: ADEQUATE

## 2017-05-27 NOTE — Discharge Summary (Signed)
[facility] NAME: Leslie Prince    MR#:  [id number]  DATE OF BIRTH:  November 06, 1945  DATE OF ADMISSION:  05/22/2017   ADMITTING PHYSICIAN: [name], MD  DATE OF DISCHARGE: 05/25/2017  2:26 PM  PRIMARY CARE PHYSICIAN: [name], MD   ADMISSION DIAGNOSIS:  Hypoxia [R09.02] Atrial fibrillation with rapid ventricular response ([name]) [I48.91] Sepsis, due to unspecified organism ([name]) [A41.9] Community acquired pneumonia, unspecified laterality [J18.9] DISCHARGE DIAGNOSIS:  Active Problems:   Atrial fibrillation with RVR ([name])  SECONDARY DIAGNOSIS:   Past Medical History:  Diagnosis Date  . CHF (congestive heart failure) ([name])   . COPD (chronic obstructive pulmonary disease) ([name])   . Sleep apnea    HOSPITAL COURSE:  This is a 72 year old male admitted for atrial fibrillation with rapid ventricular rate.  1.  Chronicarial fibrillation: With RVR;   likely due to underlying pneumonia/sepsis/hyperthyroidism -amiodarone drip helped control his heart rate. Continue Pradaxa and aspirin for anticoagulation.  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 Pneumonia: Community-acquired; improving with Abx 3. Sepsis: Present on admission. resolved with treatment. 4. CHF: Diastolic; chronic -Continue lisinopril and metoprolol. 5. COPD: continue Spiriva, inhaled corticosteroid and albuterol as needed. DISCHARGE CONDITIONS:  stable CONSULTS OBTAINED:  Treatment Team:  Isaias Cowman, MD DRUG ALLERGIES:   Allergies  Allergen Reactions  . Lisinopril Other (See Comments)    Severe urination   DISCHARGE MEDICATIONS:   Allergies as of 05/25/2017      Reactions   Lisinopril Other (See Comments)   Severe urination      Medication List    TAKE these medications   ADVAIR DISKUS  250-50 MCG/DOSE Aepb Generic drug:  Fluticasone-Salmeterol Inhale 1 puff into the lungs 2 (two) times daily.   amoxicillin-clavulanate 875-125 MG tablet Commonly known as:  AUGMENTIN Take 1 tablet by mouth every 12 (twelve) hours for 7 days.   aspirin EC 81 MG tablet Take 81 mg by mouth daily.   atorvastatin 20 MG tablet Commonly known as:  LIPITOR Take 20 mg by mouth daily.   Chromium 400 MCG Tabs Take 1 tablet by mouth 2 (two) times daily.   colchicine 0.6 MG tablet Take 0.6 mg by mouth as directed. Take 2 tablets at onset of gout flare, then 1 tablet one hour later   enalapril 20 MG tablet Commonly known as:  VASOTEC Take 20 mg by mouth 2 (two) times daily.   furosemide 40 MG tablet Commonly known as:  LASIX Take 40 mg by mouth daily.   KLOR-CON M10 10 MEQ tablet Generic drug:  potassium chloride Take 10 mEq by mouth 2 (two) times daily.   metoprolol succinate 50 MG 24 hr tablet Commonly known as:  TOPROL-XL Take 50 mg by mouth daily.   montelukast 10 MG tablet Commonly known as:  SINGULAIR TAKE 1 TABLET (10 MG TOTAL) BY MOUTH NIGHTLY.   Multiple Vitamins tablet Take 1 tablet by mouth daily.   PRADAXA 150 MG Caps capsule Generic drug:  dabigatran Take 150 mg by mouth 2 (two) times daily.   albuterol (2.5 MG/3ML) 0.083% nebulizer solution Commonly known as:  PROVENTIL Take 2.5 mg by nebulization every 6 (six) hours as needed for wheezing.   PROAIR HFA 108 (90 Base) MCG/ACT inhaler Generic drug:  albuterol Inhale 2 puffs into the lungs every 6 (six) hours as needed  for wheezing.   tiotropium 18 MCG inhalation capsule Commonly known as:  SPIRIVA Place 18 mcg into inhaler and inhale daily.   vitamin C 500 MG tablet Commonly known as:  ASCORBIC ACID Take 500 mg by mouth daily.   vitamin E 400 UNIT capsule Take 400 Units by mouth daily.        DISCHARGE INSTRUCTIONS:   DIET:  Regular diet DISCHARGE CONDITION:  Good ACTIVITY:  Activity as  tolerated OXYGEN:  Home Oxygen: No.  Oxygen Delivery: room air DISCHARGE LOCATION:  home   If you experience worsening of your admission symptoms, develop shortness of breath, life threatening emergency, suicidal or homicidal thoughts you must seek medical attention immediately by calling 911 or calling your MD immediately  if symptoms less severe.  You Must read complete instructions/literature along with all the possible adverse reactions/side effects for all the Medicines you take and that have been prescribed to you. Take any new Medicines after you have completely understood and accpet all the possible adverse reactions/side effects.   Please note  You were cared for by a hospitalist during your hospital stay. If you have any questions about your discharge medications or the care you received while you were in the hospital after you are discharged, you can call the unit and asked to speak with the hospitalist on call if the hospitalist that took care of you is not available. Once you are discharged, your primary care physician will handle any further medical issues. Please note that NO REFILLS for any discharge medications will be authorized once you are discharged, as it is imperative that you return to your primary care physician (or establish a relationship with a primary care physician if you do not have one) for your aftercare needs so that they can reassess your need for medications and monitor your lab values.    On the day of Discharge:  VITAL SIGNS:  Blood pressure 120/77, pulse (!) 109, temperature (!) 97.5 F (36.4 C), temperature source Oral, resp. rate 16, height 5\' 10"  (1.778 m), weight 130.7 kg (288 lb 3.2 oz), SpO2 95 %. PHYSICAL EXAMINATION:  GENERAL:  72 y.o.-year-old patient lying in the bed with no acute distress.  EYES: Pupils equal, round, reactive to light and accommodation. No scleral icterus. Extraocular muscles intact.  HEENT: Head atraumatic, normocephalic.  Oropharynx and nasopharynx clear.  NECK:  Supple, no jugular venous distention. No thyroid enlargement, no tenderness.  LUNGS: Normal breath sounds bilaterally, no wheezing, rales,rhonchi or crepitation. No use of accessory muscles of respiration.  CARDIOVASCULAR: S1, S2 normal. No murmurs, rubs, or gallops.  ABDOMEN: Soft, non-tender, non-distended. Bowel sounds present. No organomegaly or mass.  EXTREMITIES: No pedal edema, cyanosis, or clubbing.  NEUROLOGIC: Cranial nerves II through XII are intact. Muscle strength 5/5 in all extremities. Sensation intact. Gait not checked.  PSYCHIATRIC: The patient is alert and oriented x 3.  SKIN: No obvious rash, lesion, or ulcer.  DATA REVIEW:   CBC Recent Labs  Lab 05/24/17 0613  WBC 7.3  HGB 11.5*  HCT 33.8*  PLT 215    Chemistries  Recent Labs  Lab 05/22/17 0148 05/24/17 0613  NA 137 139  K 3.7 3.6  CL 100* 105  CO2 25 26  GLUCOSE 116* 91  BUN 15 11  CREATININE 1.01 0.89  CALCIUM 8.7* 8.6*  AST 43*  --   ALT 31  --   ALKPHOS 60  --   BILITOT 2.0*  --  Follow-up Information    [name], MD. Schedule an appointment as soon as possible for a visit in 2 week(s).   Specialty:  Family Medicine Contact information: [address] [place] [address] [phone]        Isaias Cowman, MD. Schedule an appointment as soon as possible for a visit in 1 week(s).   Specialty:  Cardiology Contact information: [facility] [place] [address] [phone]        Judi Cong, MD. Schedule an appointment as soon as possible for a visit in 2 week(s).   Specialty:  Endocrinology Why:  Hyperthyroidism? Contact information: [address] [place] [address] [phone]            Management plans discussed with the patient, family and they are in agreement.  CODE STATUS: Prior   TOTAL TIME TAKING CARE OF THIS PATIENT: 45  minutes.    Max Sane M.D on 05/27/2017 at 2:25 PM  Between 7am to 6pm - Pager - [phone]  After 6pm go to [email or web address] - Proofreader  [facility]  Office  [phone]  CC: Primary care physician; [name], MD   Note: This dictation was prepared with Dragon dictation along with smaller phrase technology. Any transcriptional errors that result from this process are unintentional.

## 2017-06-01 ENCOUNTER — Telehealth: Payer: Self-pay

## 2017-06-01 NOTE — Telephone Encounter (Signed)
-----   Message from Alisa Graff, [place] sent at 05/27/2017  6:06 PM EST ----- Regarding: call to schedule appointment Contact: [phone] Recently admitted; hospitalist made the referral

## 2017-06-01 NOTE — Telephone Encounter (Signed)
EMMI Report: Follow-up on an entry on the EMMI Report.  Pt. Noted new phone #, updated for mobile number to be primary contact #.

## 2017-06-01 NOTE — Telephone Encounter (Signed)
Spoke with Leslie Prince about making an appointment. He states that he is having some "scheduling issues" and would like [place] to give him a call back in a week or so. I will set a reminder and reach out in a week or so.

## 2017-06-13 NOTE — Telephone Encounter (Signed)
Lm with patient to follow up about making appointment.

## 2017-07-18 ENCOUNTER — Telehealth: Payer: Self-pay

## 2017-07-18 NOTE — Telephone Encounter (Signed)
Follow up call made to attempt to make new patient appointment with clinic per referral.   Pt would like to decline appointment at this time. Patient was provided information to contract clinic should he change his mind.   Thank you for the referral.

## 2017-08-23 DIAGNOSIS — Z860101 Personal history of adenomatous and serrated colon polyps: Secondary | ICD-10-CM | POA: Insufficient documentation

## 2017-08-23 DIAGNOSIS — Z86010 Personal history of colonic polyps: Secondary | ICD-10-CM | POA: Insufficient documentation

## 2017-11-07 ENCOUNTER — Encounter: Payer: Self-pay | Admitting: Anesthesiology

## 2017-11-07 ENCOUNTER — Encounter
Admission: RE | Disposition: A | Discharge status: Home / Self Care | Payer: Self-pay | Source: Ambulatory Visit | Attending: Unknown Physician Specialty

## 2017-11-07 ENCOUNTER — Ambulatory Visit: Payer: Medicare Other | Admitting: Certified Registered Nurse Anesthetist

## 2017-11-07 ENCOUNTER — Ambulatory Visit
Admission: RE | Admit: 2017-11-07 | Discharge: 2017-11-07 | Disposition: A | Discharge status: Home / Self Care | Payer: Medicare Other | Source: Ambulatory Visit | Attending: Unknown Physician Specialty | Admitting: Unknown Physician Specialty

## 2017-11-07 DIAGNOSIS — D125 Benign neoplasm of sigmoid colon: Secondary | ICD-10-CM | POA: Diagnosis not present

## 2017-11-07 DIAGNOSIS — Z7951 Long term (current) use of inhaled steroids: Secondary | ICD-10-CM | POA: Insufficient documentation

## 2017-11-07 DIAGNOSIS — J449 Chronic obstructive pulmonary disease, unspecified: Secondary | ICD-10-CM | POA: Diagnosis not present

## 2017-11-07 DIAGNOSIS — I509 Heart failure, unspecified: Secondary | ICD-10-CM | POA: Insufficient documentation

## 2017-11-07 DIAGNOSIS — D122 Benign neoplasm of ascending colon: Secondary | ICD-10-CM | POA: Insufficient documentation

## 2017-11-07 DIAGNOSIS — I2510 Atherosclerotic heart disease of native coronary artery without angina pectoris: Secondary | ICD-10-CM | POA: Diagnosis not present

## 2017-11-07 DIAGNOSIS — Z79899 Other long term (current) drug therapy: Secondary | ICD-10-CM | POA: Diagnosis not present

## 2017-11-07 DIAGNOSIS — Z7982 Long term (current) use of aspirin: Secondary | ICD-10-CM | POA: Insufficient documentation

## 2017-11-07 DIAGNOSIS — Z7901 Long term (current) use of anticoagulants: Secondary | ICD-10-CM | POA: Diagnosis not present

## 2017-11-07 DIAGNOSIS — I110 Hypertensive heart disease with heart failure: Secondary | ICD-10-CM | POA: Insufficient documentation

## 2017-11-07 DIAGNOSIS — K640 First degree hemorrhoids: Secondary | ICD-10-CM | POA: Diagnosis not present

## 2017-11-07 DIAGNOSIS — Z951 Presence of aortocoronary bypass graft: Secondary | ICD-10-CM | POA: Diagnosis not present

## 2017-11-07 DIAGNOSIS — Q439 Congenital malformation of intestine, unspecified: Secondary | ICD-10-CM | POA: Diagnosis not present

## 2017-11-07 DIAGNOSIS — Z87891 Personal history of nicotine dependence: Secondary | ICD-10-CM | POA: Insufficient documentation

## 2017-11-07 DIAGNOSIS — Z09 Encounter for follow-up examination after completed treatment for conditions other than malignant neoplasm: Secondary | ICD-10-CM | POA: Diagnosis not present

## 2017-11-07 DIAGNOSIS — G4730 Sleep apnea, unspecified: Secondary | ICD-10-CM | POA: Diagnosis not present

## 2017-11-07 DIAGNOSIS — N400 Enlarged prostate without lower urinary tract symptoms: Secondary | ICD-10-CM | POA: Insufficient documentation

## 2017-11-07 DIAGNOSIS — Z86010 Personal history of colonic polyps: Secondary | ICD-10-CM | POA: Insufficient documentation

## 2017-11-07 DIAGNOSIS — Z1211 Encounter for screening for malignant neoplasm of colon: Secondary | ICD-10-CM | POA: Insufficient documentation

## 2017-11-07 HISTORY — PX: COLONOSCOPY WITH PROPOFOL: SHX5780

## 2017-11-07 SURGERY — COLONOSCOPY WITH PROPOFOL
Anesthesia: General

## 2017-11-07 MED ORDER — SODIUM CHLORIDE 0.9 % IV SOLN
INTRAVENOUS | Status: DC
  Administered 2017-11-07: 1000 mL via INTRAVENOUS

## 2017-11-07 MED ORDER — PROPOFOL 500 MG/50ML IV EMUL
INTRAVENOUS | Status: DC | PRN
  Administered 2017-11-07: 175 ug/kg/min via INTRAVENOUS

## 2017-11-07 MED ORDER — PROPOFOL 500 MG/50ML IV EMUL
INTRAVENOUS | Status: AC
  Filled 2017-11-07: qty 50

## 2017-11-07 MED ORDER — LIDOCAINE HCL (PF) 1 % IJ SOLN
2.0000 mL | Freq: Once | INTRAMUSCULAR | Status: AC
  Administered 2017-11-07: 0.3 mL via INTRADERMAL

## 2017-11-07 MED ORDER — PROPOFOL 10 MG/ML IV BOLUS
INTRAVENOUS | Status: AC
  Filled 2017-11-07: qty 20

## 2017-11-07 MED ORDER — PROPOFOL 10 MG/ML IV BOLUS
INTRAVENOUS | Status: DC | PRN
  Administered 2017-11-07: 70 mg via INTRAVENOUS
  Administered 2017-11-07: 20 mg via INTRAVENOUS

## 2017-11-07 MED ORDER — LIDOCAINE HCL (CARDIAC) PF 100 MG/5ML IV SOSY
PREFILLED_SYRINGE | INTRAVENOUS | Status: DC | PRN
  Administered 2017-11-07: 50 mg via INTRAVENOUS

## 2017-11-07 MED ORDER — SODIUM CHLORIDE 0.9 % IV SOLN: INTRAVENOUS | Status: DC

## 2017-11-07 MED ORDER — LIDOCAINE HCL (PF) 1 % IJ SOLN
INTRAMUSCULAR | Status: AC
  Administered 2017-11-07: 0.3 mL via INTRADERMAL
  Filled 2017-11-07: qty 2

## 2017-11-07 NOTE — Anesthesia Preprocedure Evaluation (Signed)
Anesthesia Evaluation  Patient identified by MRN, date of birth, ID band Patient awake    Reviewed: Allergy & Precautions, H&P , NPO status , Patient's Chart, lab work & pertinent test results  History of Anesthesia Complications Negative for: history of anesthetic complications  Airway Mallampati: III  TM Distance: <3 FB Neck ROM: limited    Dental  (+) Chipped, Poor Dentition, Missing   Pulmonary shortness of breath and with exertion, sleep apnea , COPD, former smoker,           Cardiovascular Exercise Tolerance: Good hypertension, (-) angina+ CAD, + CABG and +CHF  (-) Past MI      Neuro/Psych negative neurological ROS  negative psych ROS   GI/Hepatic negative GI ROS, (+) Hepatitis -  Endo/Other  negative endocrine ROS  Renal/GU negative Renal ROS  negative genitourinary   Musculoskeletal   Abdominal   Peds  Hematology negative hematology ROS (+)   Anesthesia Other Findings Past Medical History: No date: CHF (congestive heart failure) ([facility]) No date: COPD (chronic obstructive pulmonary disease) ([facility]) No date: Sleep apnea  Past Surgical History: No date: CARDIAC SURGERY No date: CORONARY ANGIOPLASTY WITH STENT PLACEMENT  BMI    Body Mass Index:  41.32 kg/m      Reproductive/Obstetrics negative OB ROS                             Anesthesia Physical Anesthesia Plan  ASA: IV  Anesthesia Plan: General   Post-op Pain Management:    Induction: Intravenous  PONV Risk Score and Plan: Propofol infusion and TIVA  Airway Management Planned: Natural Airway and Nasal Cannula  Additional Equipment:   Intra-op Plan:   Post-operative Plan:   Informed Consent: I have reviewed the patients History and Physical, chart, labs and discussed the procedure including the risks, benefits and alternatives for the proposed anesthesia with the patient or authorized representative who has  indicated his/her understanding and acceptance.   Dental Advisory Given  Plan Discussed with: Anesthesiologist, CRNA and Surgeon  Anesthesia Plan Comments: (Patient consented for risks of anesthesia including but not limited to:  - adverse reactions to medications - risk of intubation if required - damage to teeth, lips or other oral mucosa - sore throat or hoarseness - Damage to heart, brain, lungs or loss of life  Patient voiced understanding.)        Anesthesia Quick Evaluation

## 2017-11-07 NOTE — Op Note (Signed)
[facility] Gastroenterology Patient Name: Leslie Prince Procedure Date: 11/07/2017 8:21 AM MRN: [id number] Account #: [id number] Date of Birth: 13-Mar-1946 Admit Type: Outpatient Age: 72 Room: [facility] ENDO ROOM 3 Gender: Male Note Status: Finalized Procedure:            Colonoscopy Indications:          High risk colon cancer surveillance: Personal history                        of colonic polyps Providers:            Manya Silvas, MD Referring MD:         Youlanda Roys. Lovie Macadamia, MD (Referring MD) Medicines:            Propofol per Anesthesia Complications:        No immediate complications. Procedure:            Pre-Anesthesia Assessment:                       - After reviewing the risks and benefits, the patient                        was deemed in satisfactory condition to undergo the                        procedure.                       After obtaining informed consent, the colonoscope was                        passed under direct vision. Throughout the procedure,                        the patient's blood pressure, pulse, and oxygen                        saturations were monitored continuously. The                        Colonoscope was introduced through the anus and                        advanced to the the cecum, identified by appendiceal                        orifice and ileocecal valve. The colonoscopy was                        somewhat difficult due to a tortuous colon. Successful                        completion of the procedure was aided by applying                        abdominal pressure. The patient tolerated the procedure                        well. The quality of the bowel preparation was good. Findings:      SEE UROLOGIST FOR ENLARGED LEFT SIDE OF PROSTATE.  A diminutive polyp was found in the proximal ascending colon. The polyp       was sessile. The polyp was removed with a jumbo cold forceps. Resection       and retrieval were  complete.      A diminutive polyp was found in the sigmoid colon. The polyp was       sessile. The polyp was removed with a jumbo cold forceps. Resection and       retrieval were complete.      Three sessile polyps were found in the sigmoid colon. The polyps were       small in size. These polyps were removed with a hot snare. Resection and       retrieval were complete.      Internal hemorrhoids were found during endoscopy. The hemorrhoids were       small and Grade I (internal hemorrhoids that do not prolapse).      The exam was otherwise without abnormality. Impression:           - One diminutive polyp in the proximal ascending colon,                        removed with a jumbo cold forceps. Resected and                        retrieved.                       - One diminutive polyp in the sigmoid colon, removed                        with a jumbo cold forceps. Resected and retrieved.                       - Three small polyps in the sigmoid colon, removed with                        a hot snare. Resected and retrieved.                       - Internal hemorrhoids.                       - The examination was otherwise normal. Recommendation:       - Await pathology results. Manya Silvas, MD 11/07/2017 9:13:45 AM This report has been signed electronically. Number of Addenda: 0 Note Initiated On: 11/07/2017 8:21 AM Scope Withdrawal Time: 0 hours 14 minutes 55 seconds  Total Procedure Duration: 0 hours 29 minutes 43 seconds       [facility]

## 2017-11-07 NOTE — Anesthesia Postprocedure Evaluation (Signed)
Anesthesia Post Note  Patient: Leslie Prince  Procedure(s) Performed: COLONOSCOPY WITH PROPOFOL (N/A )  Patient location during evaluation: Endoscopy Anesthesia Type: General Level of consciousness: awake and alert Pain management: pain level controlled Vital Signs Assessment: post-procedure vital signs reviewed and stable Respiratory status: spontaneous breathing, nonlabored ventilation, respiratory function stable and patient connected to nasal cannula oxygen Cardiovascular status: blood pressure returned to baseline and stable Postop Assessment: no apparent nausea or vomiting Anesthetic complications: no     Last Vitals:  Vitals:   11/07/17 0749 11/07/17 0913  BP: (!) 166/111 (!) 88/46  Pulse: 82 76  Resp: 17 13  Temp: (!) 36.2 C (!) 36.1 C  SpO2: 99% 95%    Last Pain:  Vitals:   11/07/17 0941  TempSrc:   PainSc: 0-No pain                 Precious Haws Piscitello

## 2017-11-07 NOTE — H&P (Signed)
Primary Care Physician:  [name], MD Primary Gastroenterologist:  Dr. [name]  Pre-Procedure History & Physical: HPI:  Leslie Prince is a 72 y.o. male is here for an colonoscopy.  Done for [facility] colon polyps.   Past Medical History:  Diagnosis Date  . CHF (congestive heart failure) ([place])   . COPD (chronic obstructive pulmonary disease) ([place])   . Sleep apnea     Past Surgical History:  Procedure Laterality Date  . CARDIAC SURGERY    . CORONARY ANGIOPLASTY WITH STENT PLACEMENT      Prior to Admission medications   Medication Sig Start Date End Date Taking? Authorizing Provider  albuterol (PROAIR HFA) 108 (90 Base) MCG/ACT inhaler Inhale 2 puffs into the lungs every 6 (six) hours as needed for wheezing.  07/28/14  Yes [provider]  albuterol (PROVENTIL) (2.5 MG/3ML) 0.083% nebulizer solution Take 2.5 mg by nebulization every 6 (six) hours as needed for wheezing.    [provider]  aspirin EC 81 MG tablet Take 81 mg by mouth daily.     [provider]  atorvastatin (LIPITOR) 20 MG tablet Take 20 mg by mouth daily. 09/03/15   [provider]  Chromium 400 MCG TABS Take 1 tablet by mouth 2 (two) times daily.    [provider]  colchicine 0.6 MG tablet Take 0.6 mg by mouth as directed. Take 2 tablets at onset of gout flare, then 1 tablet one hour later    [provider]  dabigatran (PRADAXA) 150 MG CAPS capsule Take 150 mg by mouth 2 (two) times daily.  05/11/15   [provider]  enalapril (VASOTEC) 20 MG tablet Take 20 mg by mouth 2 (two) times daily.  09/07/15   [provider]  Fluticasone-Salmeterol (ADVAIR DISKUS) 250-50 MCG/DOSE AEPB Inhale 1 puff into the lungs 2 (two) times daily.  01/26/15   [provider]  furosemide (LASIX) 40 MG tablet Take 40 mg by mouth daily.    [provider]  KLOR-CON M10 10 MEQ tablet Take 10 mEq by mouth 2 (two) times daily.  09/03/15   [provider]  metoprolol succinate (TOPROL-XL) 50 MG 24 hr tablet Take 50 mg by mouth daily.  01/25/15   [provider]  montelukast (SINGULAIR) 10 MG tablet TAKE 1 TABLET (10 MG TOTAL) BY MOUTH NIGHTLY. 08/06/15   [provider]  Multiple Vitamins tablet Take 1 tablet by mouth daily.     [provider]  tiotropium (SPIRIVA) 18 MCG inhalation capsule Place 18 mcg into inhaler and inhale daily.    [provider]  vitamin C (ASCORBIC ACID) 500 MG tablet Take 500 mg by mouth daily.     [provider]  vitamin E 400 UNIT capsule Take 400 Units by mouth daily.     [provider]    Allergies as of 09/06/2017 - Review Complete 05/22/2017  Allergen Reaction Noted  . Lisinopril Other (See Comments) 09/21/2015    History reviewed. No pertinent family history.  Social History   Socioeconomic History  . Marital status: Married    Spouse name: Not on file  . Number of children: Not on file  . Years of education: Not on file  . Highest education level: Not on file  Occupational History  . Not on file  Social Needs  . Financial resource strain: Not on file  . Food insecurity:    Worry: Not on file    Inability: Not on  file  . Transportation needs:    Medical: Not on file    Non-medical: Not on file  Tobacco Use  . Smoking status: Former Smoker    Packs/day: 1.00    Years: 25.00    Pack years: 25.00    Types: Cigarettes    Last attempt to quit: 06/21/1995    Years since quitting: 22.3  . Smokeless tobacco: Never Used  Substance and Sexual Activity  . Alcohol use: Yes  . Drug use: Not on file  . Sexual activity: Not on file  Lifestyle  . Physical activity:    Days per week: Not on file    Minutes per session: Not on file  . Stress: Not on file  Relationships  . Social connections:    Talks on phone: Not on file    Gets together: Not on file    Attends religious service: Not on file    Active member of club or  organization: Not on file    Attends meetings of clubs or organizations: Not on file    Relationship status: Not on file  . Intimate partner violence:    Fear of current or ex partner: Not on file    Emotionally abused: Not on file    Physically abused: Not on file    Forced sexual activity: Not on file  Other Topics Concern  . Not on file  Social History Narrative  . Not on file    Review of Systems: See HPI, otherwise negative ROS  Physical Exam: BP (!) 166/111   Pulse 82   Temp (!) 97.2 F (36.2 C) (Tympanic)   Resp 17   Ht 5\' 10"  (1.778 m)   Wt 130.6 kg (288 lb)   SpO2 99%   BMI 41.32 kg/m  General:   Alert,  pleasant and cooperative in NAD Head:  Normocephalic and atraumatic. Neck:  Supple; no masses or thyromegaly. Lungs:  Clear throughout to auscultation.    Heart:  Regular rate and rhythm. Abdomen:  Soft, nontender and nondistended. Normal bowel sounds, without guarding, and without rebound.   Neurologic:  Alert and  oriented x4;  grossly normal neurologically.  Impression/Plan: [name] is here for an colonoscopy to be performed for [facility] colon polyps  Risks, benefits, limitations, and alternatives regarding  colonoscopy have been reviewed with the patient.  Questions have been answered.  All parties agreeable.   Gaylyn Cheers, MD  11/07/2017, 8:27 AM

## 2017-11-07 NOTE — Anesthesia Post-op Follow-up Note (Signed)
Anesthesia QCDR form completed.        

## 2017-11-07 NOTE — Transfer of Care (Signed)
Immediate Anesthesia Transfer of Care Note  Patient: Leslie Prince  Procedure(s) Performed: COLONOSCOPY WITH PROPOFOL (N/A )  Patient Location: PACU  Anesthesia Type:General  Level of Consciousness: sedated  Airway & Oxygen Therapy: Patient Spontanous Breathing and Patient connected to nasal cannula oxygen  Post-op Assessment: Report given to RN and Post -op Vital signs reviewed and stable  Post vital signs: Reviewed and stable  Last Vitals:  Vitals Value Taken Time  BP 88/46 11/07/2017  9:13 AM  Temp 36.1 C 11/07/2017  9:13 AM  Pulse 73 11/07/2017  9:15 AM  Resp 18 11/07/2017  9:15 AM  SpO2 95 % 11/07/2017  9:15 AM  Vitals shown include unvalidated device data.  Last Pain:  Vitals:   11/07/17 0913  TempSrc: Tympanic  PainSc: 0-No pain         Complications: No apparent anesthesia complications

## 2017-11-07 NOTE — Anesthesia Procedure Notes (Signed)
Date/Time: 11/07/2017 8:33 AM Performed by: Johnna Acosta, CRNA Pre-anesthesia Checklist: Patient identified, Emergency Drugs available, Suction available, Patient being monitored and Timeout performed Patient Re-evaluated:Patient Re-evaluated prior to induction Oxygen Delivery Method: Nasal cannula Preoxygenation: Pre-oxygenation with 100% oxygen

## 2017-11-08 ENCOUNTER — Encounter: Payer: Self-pay | Admitting: Unknown Physician Specialty

## 2017-11-08 LAB — SURGICAL PATHOLOGY

## 2018-01-02 ENCOUNTER — Telehealth: Payer: Self-pay

## 2018-01-02 ENCOUNTER — Ambulatory Visit (INDEPENDENT_AMBULATORY_CARE_PROVIDER_SITE_OTHER): Payer: Medicare Other | Admitting: Urology

## 2018-01-02 ENCOUNTER — Encounter: Payer: Self-pay | Admitting: Urology

## 2018-01-02 VITALS — BP 163/91 | HR 108 | Resp 16 | Ht 69.0 in | Wt 289.0 lb

## 2018-01-02 DIAGNOSIS — N402 Nodular prostate without lower urinary tract symptoms: Secondary | ICD-10-CM | POA: Diagnosis not present

## 2018-01-02 DIAGNOSIS — N390 Urinary tract infection, site not specified: Secondary | ICD-10-CM

## 2018-01-02 DIAGNOSIS — R319 Hematuria, unspecified: Principal | ICD-10-CM

## 2018-01-02 MED ORDER — SULFAMETHOXAZOLE-TRIMETHOPRIM 800-160 MG PO TABS: 1.0000 | ORAL_TABLET | Freq: Two times a day (BID) | ORAL | 0 refills | Status: AC

## 2018-01-02 MED ORDER — DIAZEPAM 5 MG PO TABS: 5.0000 mg | ORAL_TABLET | Freq: Once | ORAL | 0 refills | Status: AC

## 2018-01-02 NOTE — Telephone Encounter (Signed)
-----   Message from Billey Co, MD sent at 01/02/2018  3:21 PM EDT ----- Regarding: abx for UTI Please start him on Bactrim DS BID for 10 days, looks like he has a UTI on today's urinalysis. We will follow up final culture results.  Nickolas Madrid, MD 01/02/2018

## 2018-01-02 NOTE — Progress Notes (Signed)
01/02/2018 1:53 PM   Leslie Prince 1945-10-10 [id number]  Referring provider: [name], MD [phone] [place],  [id number]  CC: Prostate nodule  HPI: I had the pleasure of seeing Leslie Prince in urology clinic today in consultation for prostate nodule from Dr. Lovie Macadamia.  His last PSA was November 2018, and was stable at 2.37.  He recently underwent a colonoscopy, which they noted a left-sided prostate nodule, and this was confirmed with Dr. Reuel Boom digital rectal exam recently.  He is unsure if he has a family history of prostate cancer, but it sounds like his father passed away from something unrelated.  He he does have a history of cardiac stent in 2011 as well as mitral valve repair, and is on anticoagulation with Pradaxa.  He has minimal urinary symptoms but does note some frequency secondary to his diuretic.  He does have report some recent dysuria as well.  He denies flank pain, gross hematuria, weight loss, bone pain.  Comorbidities include obesity, coronary artery disease status post stent on anticoagulation, congestive heart failure, and COPD.   PMH: Past Medical History:  Diagnosis Date  . CHF (congestive heart failure) ([place])   . COPD (chronic obstructive pulmonary disease) ([place])   . Sleep apnea     Surgical History: Past Surgical History:  Procedure Laterality Date  . CARDIAC SURGERY    . COLONOSCOPY WITH PROPOFOL N/A 11/07/2017   Procedure: COLONOSCOPY WITH PROPOFOL;  Surgeon: Manya Silvas, MD;  [facility]: [facility] ENDOSCOPY;  Service: Endoscopy;  Laterality: N/A;  . CORONARY ANGIOPLASTY WITH STENT PLACEMENT      Allergies:  Allergies  Allergen Reactions  . Lisinopril Other (See Comments)    Severe urination   Social History:  reports that he quit smoking about 22 years ago. His smoking use included cigarettes. He has a 25.00 pack-year smoking history. He has never used smokeless tobacco. He reports that he drinks alcohol. His drug history is  not on file.  ROS: Please see flowsheet from today's date for complete review of systems.  Physical Exam: BP (!) 163/91   Pulse (!) 108   Resp 16   Ht 5\' 9"  (1.753 m)   Wt 289 lb (131.1 kg)   BMI 42.68 kg/m    Constitutional:  Alert and oriented, No acute distress. Cardiovascular: No clubbing, cyanosis, or edema. Respiratory: Normal respiratory effort, no increased work of breathing. GI: Abdomen is soft, nontender, nondistended, no abdominal masses GU: No CVA tenderness DRE: 30 cc gland, notable firmness on the left side with prominent nodule Lymph: No cervical or inguinal lymphadenopathy. Skin: No rashes, bruises or suspicious lesions. Neurologic: Grossly intact, no focal deficits, moving all 4 extremities. Psychiatric: Normal mood and affect.  Laboratory Data: PSA 02/2017: 2.37 08/2016: 2.72 06/2015: 2.24 06/2014: 1.61  Urinalysis today greater than 30 WBCs, many bacteria, nitrite positive Will send for culture  Pertinent Imaging: None to review  Assessment & Plan:   In summary, Leslie Prince is a comorbid 72 year old male on anticoagulation for history of cardiac stents who presents with a new significant left-sided prostate nodule.  On my exam today I agree that the left side of the prostate is abnormal and firm with a discrete nodule.  PSA was sent today and is pending.  He also appears to have an active UTI with symptoms and Bactrim was prescribed, we will follow-up culture.  We specifically discussed the procedure of prostate biopsy, and a 1% risk of bleeding or infection.  Pre-biopsy instructions  were provided.  Start Bactrim DS twice daily for 10 days for active UTI, follow-up culture Schedule prostate biopsy, will need to hold [place], MD  [facility] [address] [place], [place] [address] [phone]

## 2018-01-02 NOTE — Telephone Encounter (Signed)
Called pt informed him of UTI, RX sent in. Verbal understanding given

## 2018-01-03 ENCOUNTER — Telehealth: Payer: Self-pay | Admitting: Family Medicine

## 2018-01-03 LAB — MICROSCOPIC EXAMINATION: WBC, UA: >30 /hpf — ABNORMAL HIGH (ref 0–5)

## 2018-01-03 LAB — URINALYSIS, COMPLETE
Bilirubin, UA: NEGATIVE
GLUCOSE, UA: NEGATIVE
Ketones, UA: NEGATIVE
Nitrite, UA: POSITIVE — AB
PH UA: 5.5 (ref 5.0–7.5)
PROTEIN UA: NEGATIVE
Specific Gravity, UA: 1.01 (ref 1.005–1.030)
Urobilinogen, Ur: 0.2 mg/dL (ref 0.2–1.0)

## 2018-01-03 LAB — FPSA% REFLEX
% FREE PSA: 9.3 %
PSA, FREE: 0.55 ng/mL

## 2018-01-03 LAB — PSA TOTAL (REFLEX TO FREE): PROSTATE SPECIFIC AG, SERUM: 5.9 ng/mL — AB (ref 0.0–4.0)

## 2018-01-03 NOTE — Telephone Encounter (Signed)
-----   Message from Billey Co, MD sent at 01/03/2018  8:13 AM EDT ----- Please let him know his PSA is now elevated at 5.9, proceed with scheduled prostate biopsy as planned.  Nickolas Madrid, MD 01/03/2018

## 2018-01-03 NOTE — Telephone Encounter (Signed)
Patient notified

## 2018-01-03 NOTE — Telephone Encounter (Signed)
Spoke to patient and notified him that his Cardiologist has cleared him to do Biopsy. He is to stop Prodaxa 5 days prior

## 2018-01-07 LAB — CULTURE, URINE COMPREHENSIVE

## 2018-01-21 ENCOUNTER — Telehealth: Payer: Self-pay | Admitting: Urology

## 2018-01-21 ENCOUNTER — Other Ambulatory Visit: Payer: Medicare Other | Admitting: Urology

## 2018-01-21 NOTE — Telephone Encounter (Signed)
Pt's biopsy had to be rescheduled due to pt not stopping aspirin.  Biopsy was rescheduled for 11/4 and pt would like another valium sent in to [facility].

## 2018-01-22 MED ORDER — DIAZEPAM 5 MG PO TABS: 5.0000 mg | ORAL_TABLET | Freq: Once | ORAL | 0 refills | Status: DC | PRN

## 2018-01-22 NOTE — Telephone Encounter (Signed)
Valium prescribed and sent to pharmacy. Thanks

## 2018-01-22 NOTE — Telephone Encounter (Signed)
Please advise 

## 2018-01-22 NOTE — Addendum Note (Signed)
Addended by: Billey Co on: 01/22/2018 11:26 AM   Modules accepted: Orders

## 2018-02-07 ENCOUNTER — Ambulatory Visit: Payer: Medicare Other | Admitting: Urology

## 2018-02-08 ENCOUNTER — Ambulatory Visit: Payer: Medicare Other | Admitting: Urology

## 2018-02-11 ENCOUNTER — Ambulatory Visit (INDEPENDENT_AMBULATORY_CARE_PROVIDER_SITE_OTHER): Payer: Medicare Other | Admitting: Family Medicine

## 2018-02-11 ENCOUNTER — Other Ambulatory Visit: Payer: Medicare Other | Admitting: Urology

## 2018-02-11 ENCOUNTER — Encounter: Payer: Self-pay | Admitting: Family Medicine

## 2018-02-11 VITALS — BP 147/94 | HR 83 | Ht 69.0 in | Wt 285.0 lb

## 2018-02-11 DIAGNOSIS — N390 Urinary tract infection, site not specified: Secondary | ICD-10-CM

## 2018-02-11 DIAGNOSIS — R319 Hematuria, unspecified: Secondary | ICD-10-CM | POA: Diagnosis not present

## 2018-02-11 LAB — URINALYSIS, COMPLETE
BILIRUBIN UA: NEGATIVE
GLUCOSE, UA: NEGATIVE
Ketones, UA: NEGATIVE
Nitrite, UA: POSITIVE — AB
PROTEIN UA: NEGATIVE
SPEC GRAV UA: 1.015 (ref 1.005–1.030)
UUROB: 0.2 mg/dL (ref 0.2–1.0)
pH, UA: 5.5 (ref 5.0–7.5)

## 2018-02-11 LAB — MICROSCOPIC EXAMINATION: Epithelial Cells (non renal): NONE SEEN /hpf (ref 0–10)

## 2018-02-11 MED ORDER — CIPROFLOXACIN HCL 500 MG PO TABS: 500.0000 mg | ORAL_TABLET | Freq: Two times a day (BID) | ORAL | 0 refills | Status: DC

## 2018-02-11 NOTE — Progress Notes (Signed)
Patient presents today with dysuria. His symptoms have been on and off for 2 weeks. A urine was collected for UA, UCX. Patient states he has been on ABX in the last 30 days. He has not had any Urological surgeries in the 30 days. [place] reviewed the UA and Cipro was sent to pharmacy.

## 2018-02-13 LAB — CULTURE, URINE COMPREHENSIVE

## 2018-02-25 ENCOUNTER — Ambulatory Visit: Payer: Medicare Other | Admitting: Urology

## 2018-03-14 ENCOUNTER — Other Ambulatory Visit: Payer: Self-pay

## 2018-03-14 ENCOUNTER — Ambulatory Visit (INDEPENDENT_AMBULATORY_CARE_PROVIDER_SITE_OTHER): Payer: Medicare Other | Admitting: Urology

## 2018-03-14 ENCOUNTER — Encounter: Payer: Self-pay | Admitting: Urology

## 2018-03-14 ENCOUNTER — Other Ambulatory Visit: Payer: Self-pay | Admitting: Urology

## 2018-03-14 VITALS — BP 122/72 | HR 82

## 2018-03-14 DIAGNOSIS — N402 Nodular prostate without lower urinary tract symptoms: Secondary | ICD-10-CM

## 2018-03-14 DIAGNOSIS — C61 Malignant neoplasm of prostate: Secondary | ICD-10-CM

## 2018-03-14 MED ORDER — GENTAMICIN SULFATE 40 MG/ML IJ SOLN
80.0000 mg | Freq: Once | INTRAMUSCULAR | Status: AC
  Administered 2018-03-14: 80 mg via INTRAMUSCULAR

## 2018-03-14 MED ORDER — LEVOFLOXACIN 500 MG PO TABS
500.0000 mg | ORAL_TABLET | Freq: Once | ORAL | Status: AC
  Administered 2018-03-14: 500 mg via ORAL

## 2018-03-14 NOTE — Progress Notes (Addendum)
   03/14/18  Indication: PSA 5.9 (9% free), left sided nodule on DRE  Prostate Biopsy Procedure   Informed consent was obtained, and we discussed the risks of bleeding and infection/sepsis. A time out was performed to ensure correct patient identity.  Pre-Procedure: - Last PSA Level: 5.9 - Gentamicin and levaquin given for antibiotic prophylaxis - Transrectal Ultrasound performed revealing a 54 gm prostate, PSA density 0.11 - Hypoechoic area consistent with left-sided nodule on DRE  Procedure: - Prostate block performed using 10 cc 1% lidocaine and biopsies taken from sextant areas, a total of 12 under ultrasound guidance.  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  I discussed the results over the phone today, 03/21/2018 with the patient, and the need for staging imaging prior to discussing treatment strategies.  Bone scan and CT abdomen pelvis with contrast ordered.  If negative will pursue radiation with ADT, versus referral to medical oncology if metastatic disease.  Nickolas Madrid, MD 03/14/2018

## 2018-03-15 DIAGNOSIS — N402 Nodular prostate without lower urinary tract symptoms: Secondary | ICD-10-CM | POA: Insufficient documentation

## 2018-03-15 DIAGNOSIS — M1A9XX0 Chronic gout, unspecified, without tophus (tophi): Secondary | ICD-10-CM | POA: Insufficient documentation

## 2018-03-20 ENCOUNTER — Other Ambulatory Visit: Payer: Self-pay | Admitting: Urology

## 2018-03-20 LAB — PATHOLOGY REPORT

## 2018-03-21 ENCOUNTER — Telehealth: Payer: Self-pay | Admitting: Urology

## 2018-03-21 NOTE — Telephone Encounter (Signed)
-----   Message from Billey Co, MD sent at 03/21/2018  9:31 AM EST ----- I discussed his pathology results with him today on the phone.  I ordered staging imaging stat with bone scan and CT abdomen pelvis with contrast.  Please facilitate obtaining imaging prior to follow-up visit with me 12/18.  Thanks!  Nickolas Madrid, MD 03/21/2018

## 2018-03-21 NOTE — Addendum Note (Signed)
Addended by: Billey Co on: 03/21/2018 09:30 AM   Modules accepted: Orders

## 2018-03-21 NOTE — Telephone Encounter (Signed)
Orders sent to scheduling and they will try to get him in prior to his follow up

## 2018-03-25 ENCOUNTER — Other Ambulatory Visit: Payer: Self-pay | Admitting: Urology

## 2018-03-26 ENCOUNTER — Encounter
Admission: RE | Admit: 2018-03-26 | Discharge: 2018-03-26 | Disposition: A | Discharge status: Home / Self Care | Payer: Medicare Other | Source: Ambulatory Visit | Attending: Urology | Admitting: Urology

## 2018-03-26 ENCOUNTER — Ambulatory Visit
Admission: RE | Admit: 2018-03-26 | Discharge: 2018-03-26 | Disposition: A | Discharge status: Home / Self Care | Payer: Medicare Other | Source: Ambulatory Visit | Attending: Urology | Admitting: Urology

## 2018-03-26 DIAGNOSIS — C61 Malignant neoplasm of prostate: Secondary | ICD-10-CM | POA: Diagnosis not present

## 2018-03-26 MED ORDER — IOPAMIDOL (ISOVUE-370) INJECTION 76%
100.0000 mL | Freq: Once | INTRAVENOUS | Status: AC | PRN
  Administered 2018-03-26: 100 mL via INTRAVENOUS

## 2018-03-26 MED ORDER — TECHNETIUM TC 99M MEDRONATE IV KIT
21.6600 | PACK | Freq: Once | INTRAVENOUS | Status: AC | PRN
  Administered 2018-03-26: 21.66 via INTRAVENOUS

## 2018-03-27 ENCOUNTER — Encounter: Payer: Self-pay | Admitting: Urology

## 2018-03-27 ENCOUNTER — Ambulatory Visit (INDEPENDENT_AMBULATORY_CARE_PROVIDER_SITE_OTHER): Payer: Medicare Other | Admitting: Urology

## 2018-03-27 VITALS — BP 162/97 | HR 85 | Ht 69.0 in | Wt 288.9 lb

## 2018-03-27 DIAGNOSIS — C61 Malignant neoplasm of prostate: Secondary | ICD-10-CM | POA: Diagnosis not present

## 2018-03-27 NOTE — Progress Notes (Addendum)
   03/27/2018 12:10 PM   [name] [birth year] [id number]  Reason for visit: Discuss prostate biopsy results  HPI: I saw Mr. Shanks back in urology clinic to discuss his prostate biopsy results.  To briefly summarize he is a 72 year old male that underwent prostate biopsy on 03/14/2018 for a abnormal DRE with PSA of 5.9 which demonstrated a 54 g gland, PSA density of 0.11, and Gleason score 4+4 = 8 in 5/12 cores, with max core involvement of 81%.  He is already undergone staging imaging with CT A/P with contrast and bone scan that was negative for any obvious metastatic disease.  A 4.5 cm AAA was incidentally found on CT scan.  He has a number of comorbidities including morbid obesity with BMI of 43, CAD with cardiac stent on anticoagulation with Pradaxa, congestive heart failure, and COPD.  He has very mild urinary symptoms, his main complaint is urinary frequency when he takes Lasix.  He has moderate to severe erectile dysfunction despite the use of PDE 5 inhibitors.    We had a lengthy conversation today about the patient's new diagnosis of prostate cancer.  We reviewed the risk classifications per the AUA guidelines including very low risk, low risk, intermediate risk, and high risk disease, and the need for additional staging imaging with CT and bone scan in patients with unfavorable intermediate risk and high risk disease.  I explained that his life expectancy, clinical stage, Gleason score, PSA, and other co-morbidities influence treatment strategies.  We discussed the roles of active surveillance, radiation therapy, surgical therapy with robotic prostatectomy, and hormone therapy with androgen deprivation.  We discussed that patients urinary symptoms also impact treatment strategy, as patients with severe lower urinary tract symptoms may have significant worsening or even develop urinary retention after undergoing radiation.  In regards to surgery, we briefly discussed the risks and  benefits of robotic prostatectomy.  With his morbid obesity and numerous co-morbidities he is not a surgical candidate.  In summary, AIRAM HEIDECKER is a 72 y.o. man with multiple co-morbidities and newly diagnosed high risk prostate cancer with negative staging imaging. He would like to pursue consultation with radiation oncology and simultaneous 2 years of ADT.  Referral to RadOnc placed RTC 2 weeks to initiate ADT, 2 years total lupron Dr. Lovie Macadamia messaged regarding vascular surgery referral for AAA  A total of 30 minutes were spent face-to-face with the patient, greater than 50% was spent in patient education, counseling, and coordination of care regarding new diagnosis of high risk prostate cancer, review of staging imaging, and treatment strategies including hormone therapy and radiation.  Billey Co, [facility] [address] [place], [place] [address] [phone]

## 2018-03-28 ENCOUNTER — Ambulatory Visit: Payer: Self-pay | Admitting: Urology

## 2018-04-12 ENCOUNTER — Ambulatory Visit: Payer: Medicare Other

## 2018-04-17 ENCOUNTER — Encounter: Payer: Self-pay | Admitting: Radiation Oncology

## 2018-04-17 ENCOUNTER — Other Ambulatory Visit: Payer: Self-pay

## 2018-04-17 ENCOUNTER — Ambulatory Visit (INDEPENDENT_AMBULATORY_CARE_PROVIDER_SITE_OTHER): Payer: Medicare Other | Admitting: Urology

## 2018-04-17 ENCOUNTER — Encounter: Payer: Self-pay | Admitting: Urology

## 2018-04-17 ENCOUNTER — Ambulatory Visit
Admission: RE | Admit: 2018-04-17 | Discharge: 2018-04-17 | Disposition: A | Discharge status: Home / Self Care | Payer: Medicare Other | Source: Ambulatory Visit | Attending: Radiation Oncology | Admitting: Radiation Oncology

## 2018-04-17 VITALS — BP 157/97 | HR 77 | Temp 98.0°F | Resp 18 | Wt 287.5 lb

## 2018-04-17 VITALS — BP 150/96 | HR 74 | Ht 70.0 in | Wt 285.0 lb

## 2018-04-17 DIAGNOSIS — Z7901 Long term (current) use of anticoagulants: Secondary | ICD-10-CM | POA: Insufficient documentation

## 2018-04-17 DIAGNOSIS — Z87891 Personal history of nicotine dependence: Secondary | ICD-10-CM | POA: Diagnosis not present

## 2018-04-17 DIAGNOSIS — C61 Malignant neoplasm of prostate: Secondary | ICD-10-CM

## 2018-04-17 DIAGNOSIS — G4730 Sleep apnea, unspecified: Secondary | ICD-10-CM | POA: Insufficient documentation

## 2018-04-17 DIAGNOSIS — N529 Male erectile dysfunction, unspecified: Secondary | ICD-10-CM | POA: Diagnosis not present

## 2018-04-17 DIAGNOSIS — J449 Chronic obstructive pulmonary disease, unspecified: Secondary | ICD-10-CM | POA: Diagnosis not present

## 2018-04-17 DIAGNOSIS — Z79899 Other long term (current) drug therapy: Secondary | ICD-10-CM | POA: Insufficient documentation

## 2018-04-17 DIAGNOSIS — E669 Obesity, unspecified: Secondary | ICD-10-CM | POA: Diagnosis not present

## 2018-04-17 DIAGNOSIS — I509 Heart failure, unspecified: Secondary | ICD-10-CM | POA: Insufficient documentation

## 2018-04-17 DIAGNOSIS — Z7982 Long term (current) use of aspirin: Secondary | ICD-10-CM | POA: Diagnosis not present

## 2018-04-17 MED ORDER — LEUPROLIDE ACETATE (6 MONTH) 45 MG IM KIT
45.0000 mg | PACK | Freq: Once | INTRAMUSCULAR | Status: AC
  Administered 2018-04-24: 45 mg via INTRAMUSCULAR

## 2018-04-17 NOTE — Progress Notes (Signed)
Lupron IM Injection   Due to Prostate Cancer patient is present today for a Lupron Injection.  Medication: Lupron 6 month Dose: 45 mg  Location: right upper outer buttocks Lot: [id number] Exp: 03/02/2020  Patient tolerated well, no complications were noted  Performed by: Libby Maw)  Follow up: 6 months

## 2018-04-17 NOTE — Patient Instructions (Signed)
Calcium 600 mg twice daily with food.

## 2018-04-18 NOTE — Progress Notes (Signed)
04/17/2018 8:17 AM   Leslie Prince Feb 08, 1946 [id number]  Referring provider: [name], MD [phone] [place], [place] [address]  Chief Complaint  Patient presents with  . Prostate Cancer    start lupron    HPI: Patient is a 73 year old Caucasian male with prostate cancer who presents today to begin ADT therapy.  Oncology history Prostate biopsy on 03/14/2018 for a abnormal DRE with PSA of 5.9 which demonstrated a 54 g gland, PSA density of 0.11, and Gleason score 4+4 = 8 in 5/12 cores, with max core involvement of 81%.  He is already undergone staging imaging with CT A/P with contrast and bone scan that was negative for any obvious metastatic disease.  A 4.5 cm AAA was incidentally found on CT scan.  (he states he has spoken with Dr. Lovie Macadamia regarding this issue)  He has been seen in consultation with Dr. Donella Stade who has recommended image guided I MRT radiation therapy.  He is scheduled with Dr. Erlene Quan on 05/02/2018 for fiducial marker placement.  It is recommended that he also receive 2 years of ADT.    He has a number of comorbidities including morbid obesity with BMI of 43, CAD with cardiac stent on anticoagulation with Pradaxa, congestive heart failure, and COPD.  He has very mild urinary symptoms, his main complaint is urinary frequency when he takes Lasix.  He has moderate to severe erectile dysfunction despite the use of PDE 5 inhibitors.  Today, he brings in his fiduciary markers.    PMH: Past Medical History:  Diagnosis Date  . CHF (congestive heart failure) ([place])   . COPD (chronic obstructive pulmonary disease) ([place])   . Sleep apnea     Surgical History: Past Surgical History:  Procedure Laterality Date  . CARDIAC SURGERY    . COLONOSCOPY WITH PROPOFOL N/A 11/07/2017   Procedure: COLONOSCOPY WITH PROPOFOL;  Surgeon: Manya Silvas, MD;  Location: [facility] ENDOSCOPY;  Service: Endoscopy;  Laterality: N/A;  . CORONARY ANGIOPLASTY WITH STENT PLACEMENT       Home Medications:  Allergies as of 04/17/2018      Reactions   Lisinopril Other (See Comments)   Severe urination      Medication List       Accurate as of April 17, 2018 11:59 PM. Always use your most recent med list.        ADVAIR DISKUS 250-50 MCG/DOSE Aepb Generic drug:  Fluticasone-Salmeterol Inhale 1 puff into the lungs 2 (two) times daily.   aspirin EC 81 MG tablet Take 81 mg by mouth daily.   atorvastatin 20 MG tablet Commonly known as:  LIPITOR Take 20 mg by mouth daily.   Chromium 400 MCG Tabs Take 1 tablet by mouth 2 (two) times daily.   colchicine 0.6 MG tablet Take 0.6 mg by mouth as directed. Take 2 tablets at onset of gout flare, then 1 tablet one hour later   enalapril 20 MG tablet Commonly known as:  VASOTEC Take 20 mg by mouth 2 (two) times daily.   furosemide 40 MG tablet Commonly known as:  LASIX Take 40 mg by mouth daily.   KLOR-CON M10 10 MEQ tablet Generic drug:  potassium chloride Take 10 mEq by mouth 2 (two) times daily.   metoprolol succinate 100 MG 24 hr tablet Commonly known as:  TOPROL-XL Take 100 mg by mouth daily.   montelukast 10 MG tablet Commonly known as:  SINGULAIR TAKE 1 TABLET (10 MG TOTAL) BY MOUTH  NIGHTLY.   Multiple Vitamins tablet Take 1 tablet by mouth daily.   PRADAXA 150 MG Caps capsule Generic drug:  dabigatran Take 150 mg by mouth 2 (two) times daily.   albuterol (2.5 MG/3ML) 0.083% nebulizer solution Commonly known as:  PROVENTIL Take 2.5 mg by nebulization every 6 (six) hours as needed for wheezing.   PROAIR HFA 108 (90 Base) MCG/ACT inhaler Generic drug:  albuterol Inhale 2 puffs into the lungs every 6 (six) hours as needed for wheezing.   tiotropium 18 MCG inhalation capsule Commonly known as:  SPIRIVA Place 18 mcg into inhaler and inhale daily.   vitamin C 500 MG tablet Commonly known as:  ASCORBIC ACID Take 500 mg by mouth daily.   vitamin E 400 UNIT capsule Take 400 Units by mouth  daily.       Allergies:  Allergies  Allergen Reactions  . Lisinopril Other (See Comments)    Severe urination    Family History: No family history on file.  Social History:  reports that he quit smoking about 22 years ago. His smoking use included cigarettes. He has a 25.00 pack-year smoking history. He has quit using smokeless tobacco. He reports current alcohol use. He reports that he does not use drugs.  ROS: UROLOGY Frequent Urination?: No Hard to postpone urination?: No Burning/pain with urination?: No Get up at night to urinate?: No Leakage of urine?: No Urine stream starts and stops?: No Trouble starting stream?: No Do you have to strain to urinate?: No Blood in urine?: No Urinary tract infection?: No Sexually transmitted disease?: No Injury to kidneys or bladder?: No Painful intercourse?: No Weak stream?: No Erection problems?: No Penile pain?: No  Gastrointestinal Nausea?: No Vomiting?: No Indigestion/heartburn?: No Diarrhea?: No Constipation?: No  Constitutional Fever: No Night sweats?: No Weight loss?: No Fatigue?: No  Skin Skin rash/lesions?: No Itching?: No  Eyes Blurred vision?: No Double vision?: No  Ears/Nose/Throat Sore throat?: No Sinus problems?: No  Hematologic/Lymphatic Swollen glands?: No Easy bruising?: No  Cardiovascular Leg swelling?: No Chest pain?: No  Respiratory Cough?: No Shortness of breath?: No  Endocrine Excessive thirst?: No  Musculoskeletal Back pain?: No Joint pain?: No  Neurological Headaches?: No Dizziness?: No  Psychologic Depression?: No Anxiety?: No  Physical Exam: BP (!) 150/96   Pulse 74   Ht 5\' 10"  (1.778 m)   Wt 285 lb (129.3 kg)   BMI 40.89 kg/m   Constitutional:  Well nourished. Alert and oriented, No acute distress. HEENT: [place] AT, moist mucus membranes.  Trachea midline, no masses. Cardiovascular: No clubbing, cyanosis, or edema. Respiratory: Normal respiratory effort, no  increased work of breathing. GI: Abdomen is obese, soft, non tender, non distended, no abdominal masses.  Skin: No rashes, bruises or suspicious lesions. Lymph: No  inguinal adenopathy. Neurologic: Grossly intact, no focal deficits, moving all 4 extremities. Psychiatric: Normal mood and affect.  Laboratory Data: Lab Results  Component Value Date   WBC 7.3 05/24/2017   HGB 11.5 (L) 05/24/2017   HCT 33.8 (L) 05/24/2017   MCV 90.7 05/24/2017   PLT 215 05/24/2017    Lab Results  Component Value Date   CREATININE 0.89 05/24/2017    No results found for: PSA  No results found for: TESTOSTERONE  No results found for: HGBA1C  Lab Results  Component Value Date   TSH 0.258 (L) 05/22/2017    No results found for: CHOL, HDL, CHOLHDL, VLDL, LDLCALC  Lab Results  Component Value Date   AST 43 (H)  05/22/2017   Lab Results  Component Value Date   ALT 31 05/22/2017   No components found for: ALKALINEPHOPHATASE No components found for: BILIRUBINTOTAL  No results found for: ESTRADIOL  Urinalysis    Component Value Date/Time   COLORURINE YELLOW (A) 05/22/2017 0153   APPEARANCEUR Cloudy (A) 02/11/2018 1344   LABSPEC 1.014 05/22/2017 0153   PHURINE 5.0 05/22/2017 0153   GLUCOSEU Negative 02/11/2018 1344   HGBUR SMALL (A) 05/22/2017 0153   BILIRUBINUR Negative 02/11/2018 1344   KETONESUR 5 (A) 05/22/2017 0153   PROTEINUR Negative 02/11/2018 1344   PROTEINUR NEGATIVE 05/22/2017 0153   NITRITE Positive (A) 02/11/2018 1344   NITRITE NEGATIVE 05/22/2017 0153   LEUKOCYTESUR 2+ (A) 02/11/2018 1344    I have reviewed the labs.   Assessment & Plan:    1. 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   We also discussed bone health and I have advised the patient to start Calcium 600 gm twice daily and Vitamin D 200 mg twice daily- patient is already taking a multi-vitamin which contains an adequate amount of Vitamin D - Leuprolide Acetate (6 Month) (LUPRON) injection 45 mg - given today    No follow-ups on file.  These notes generated with voice recognition software. I apologize for typographical errors.  Zara Council, PA-C  [facility] [address]  [place], [place] [address] [phone]

## 2018-04-22 ENCOUNTER — Telehealth: Payer: Self-pay | Admitting: Urology

## 2018-04-22 NOTE — Telephone Encounter (Signed)
Appointment made for gold seed marker placement. Patient aware. Advised patient to stop NSAIDS & aspirin 81mg  x 7 days prior to procedure & hold Pradaxa x 5 days prior to procedure. Advised patient to administer a Fleets enema 2 hours prior & to eat a light meal prior to procedure. Questions answered. Patient expresses understanding of instructions.

## 2018-04-22 NOTE — Telephone Encounter (Signed)
Mr. Leslie Prince has sent a MyChart message to see when his appointment is for putting in the markers.  Dr. Diamantina Providence stated he needed to be scheduled with either Dr. Erlene Quan or Dr. Bernardo Heater for this.  I do not see Dr. Crystals note for his recommendations.

## 2018-04-22 NOTE — Consult Note (Signed)
NEW PATIENT EVALUATION  Name: Leslie Prince  MRN: [id number]  Date:   04/17/2018     DOB: Feb 08, 1946   This 73 y.o. 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  REFERRING PHYSICIAN: [name], MD  CHIEF COMPLAINT:  Chief Complaint  Patient presents with  . Prostate Cancer    Initial Eval    DIAGNOSIS: The encounter diagnosis was Malignant neoplasm of prostate ([place]).   PREVIOUS INVESTIGATIONS:  CT scan of abdomen and pelvis and bone scan reviewed Clinical notes reviewed Pathology reports reviewed  HPI: patient is a 73 year old male who actually had his abnormal prostate digital rectal exam performed by his GI doctor. He was referred to urology with a PSA of 5.9. He underwent transrectal ultrasound guided biopsy showing 5 of 12 cores positive for Gleason 8 (4+4) adenocarcinoma. These were all from the left lobe the prostate which was the area of abnormality on digital rectal exam.he was noted incidentally to have a 4.5 cm abdominal aortic aneurysm. He has multiple Medical comorbidities including morbid obesity with a BMI of 43 cardio vascular disease with cardiac stenting currently on anticoagulation with Pradaxa. He also has a history of congestive heart failure and COPD. Patient does take Lasix has some urinary frequency and urgencyand moderate to severe erectile dysfunction.according to urology he is not thought to be a surgical candidate based on his comorbidities. He is seen today for radiation oncology opinion.patient is had a bone scan showing no definitive evidence of metastatic disease. Also CT scan of abdomen and pelvis showing no evidence of extracapsular extension or pelvic adenopathy.  PLANNED TREATMENT REGIMEN: image guided I MRT radiation therapy  PAST MEDICAL HISTORY:  has a past medical history of CHF (congestive heart failure) ([place]), COPD (chronic  obstructive pulmonary disease) ([place]), and Sleep apnea.    PAST SURGICAL HISTORY:  Past Surgical History:  Procedure Laterality Date  . CARDIAC SURGERY    . COLONOSCOPY WITH PROPOFOL N/A 11/07/2017   Procedure: COLONOSCOPY WITH PROPOFOL;  Surgeon: Manya Silvas, MD;  Location: [facility] ENDOSCOPY;  Service: Endoscopy;  Laterality: N/A;  . CORONARY ANGIOPLASTY WITH STENT PLACEMENT      FAMILY HISTORY: family history is not on file.  SOCIAL HISTORY:  reports that he quit smoking about 22 years ago. His smoking use included cigarettes. He has a 25.00 pack-year smoking history. He has quit using smokeless tobacco. He reports current alcohol use. He reports that he does not use drugs.  ALLERGIES: Lisinopril  MEDICATIONS:  Current Outpatient Medications  Medication Sig Dispense Refill  . albuterol (PROAIR HFA) 108 (90 Base) MCG/ACT inhaler Inhale 2 puffs into the lungs every 6 (six) hours as needed for wheezing.     Marland Kitchen albuterol (PROVENTIL) (2.5 MG/3ML) 0.083% nebulizer solution Take 2.5 mg by nebulization every 6 (six) hours as needed for wheezing.    Marland Kitchen aspirin EC 81 MG tablet Take 81 mg by mouth daily.     Marland Kitchen atorvastatin (LIPITOR) 20 MG tablet Take 20 mg by mouth daily.  1  . Chromium 400 MCG TABS Take 1 tablet by mouth 2 (two) times daily.    . colchicine 0.6 MG tablet Take 0.6 mg by mouth as directed. Take 2 tablets at onset of gout flare, then 1 tablet one hour later    . dabigatran (PRADAXA) 150 MG CAPS capsule Take 150 mg by mouth 2 (two) times daily.     Marland Kitchen  enalapril (VASOTEC) 20 MG tablet Take 20 mg by mouth 2 (two) times daily.     . Fluticasone-Salmeterol (ADVAIR DISKUS) 250-50 MCG/DOSE AEPB Inhale 1 puff into the lungs 2 (two) times daily.     . furosemide (LASIX) 40 MG tablet Take 40 mg by mouth daily.    Marland Kitchen KLOR-CON M10 10 MEQ tablet Take 10 mEq by mouth 2 (two) times daily.   4  . metoprolol succinate (TOPROL-XL) 100 MG 24 hr tablet Take 100 mg by mouth daily.    . montelukast  (SINGULAIR) 10 MG tablet TAKE 1 TABLET (10 MG TOTAL) BY MOUTH NIGHTLY.  3  . Multiple Vitamins tablet Take 1 tablet by mouth daily.     Marland Kitchen tiotropium (SPIRIVA) 18 MCG inhalation capsule Place 18 mcg into inhaler and inhale daily.    . vitamin C (ASCORBIC ACID) 500 MG tablet Take 500 mg by mouth daily.     . vitamin E 400 UNIT capsule Take 400 Units by mouth daily.      Current Facility-Administered Medications  Medication Dose Route Frequency Provider Last Rate Last Dose  . Leuprolide Acetate (6 Month) (LUPRON) injection 45 mg  45 mg Intramuscular Once McGowan, Shannon A, PA-C        ECOG PERFORMANCE STATUS:  1 - Symptomatic but completely ambulatory  REVIEW OF SYSTEMS: except for his congestive heart failure COPD morbid obesity  Patient denies any weight loss, fatigue, weakness, fever, chills or night sweats. Patient denies any loss of vision, blurred vision. Patient denies any ringing  of the ears or hearing loss. No irregular heartbeat. Patient denies heart murmur or history of fainting. Patient denies any chest pain or pain radiating to her upper extremities. Patient denies any shortness of breath, difficulty breathing at night, cough or hemoptysis. Patient denies any swelling in the lower legs. Patient denies any nausea vomiting, vomiting of blood, or coffee ground material in the vomitus. Patient denies any stomach pain. Patient states has had normal bowel movements no significant constipation or diarrhea. Patient denies any dysuria, hematuria or significant nocturia. Patient denies any problems walking, swelling in the joints or loss of balance. Patient denies any skin changes, loss of hair or loss of weight. Patient denies any excessive worrying or anxiety or significant depression. Patient denies any problems with insomnia. Patient denies excessive thirst, polyuria, polydipsia. Patient denies any swollen glands, patient denies easy bruising or easy bleeding. Patient denies any recent  infections, allergies or URI. Patient "s visual fields have not changed significantly in recent time.    PHYSICAL EXAM: BP (!) 157/97   Pulse 77   Temp 98 F (36.7 C)   Resp 18   Wt 287 lb 7.7 oz (130.4 kg)   BMI 42.45 kg/m  On rectal exam rectal sphincter tone is good there is induration in the entire left prostate lobe. Sulcus is preserved bilaterally summer best regions appear uninvolved. No other rectal abnormality is identified.Well-developed well-nourished patient in NAD. HEENT reveals PERLA, EOMI, discs not visualized.  Oral cavity is clear. No oral mucosal lesions are identified. Neck is clear without evidence of cervical or supraclavicular adenopathy. Lungs are clear to A&P. Cardiac examination is essentially unremarkable with regular rate and rhythm without murmur rub or thrill. Abdomen is benign with no organomegaly or masses noted. Motor sensory and DTR levels are equal and symmetric in the upper and lower extremities. Cranial nerves II through XII are grossly intact. Proprioception is intact. No peripheral adenopathy or edema is identified. No motor  or sensory levels are noted. Crude visual fields are within normal range.  LABORATORY DATA: pathology reports reviewed    RADIOLOGY RESULTS:bone scan and CT scans reviewed and compatible with the above-stated findings   IMPRESSION: stage IIB Gleason 8 (4+4) adenocarcinoma the prostate presenting with a PSA of 8.47 in 73 year old with multiple medical comorbidities precluding surgery  PLAN: at this time I believe candidate would be excellent choice for image guided IM RT radiation therapy. I have run the [facility] nomogram showing only 25% chance of organ confined disease with 73% chance of extracapsular extension and 22% chance of lymph node involvement. I believe the 22% chance of lymph node involvement is not sufficient to warrant pelvic lymph node radiation since based on his morbid obesity this would make toxicity see  to his bowel significant. I would plan on delivering 8000 cGy over 8 weeks using image guided I MRT radiation therapy. Patient also will benefit from 2 years of androgen deprivation therapy. I've referred him back to urology to start Lupron. Also asked him to place fiducial markers in his prostate for daily image guided treatment. We will set up CT simulation after his markers are placed. Risks and benefits of treatment including increased lower urinary tract symptoms diarrhea fatigue alteration of blood counts skin reaction all were discussed with in detail with the patient and his son. They both seem to comprehend my treatment plan well.  I would like to take this opportunity to thank you for allowing me to participate in the care of your patient.Noreene Filbert, MD

## 2018-04-24 DIAGNOSIS — C61 Malignant neoplasm of prostate: Secondary | ICD-10-CM | POA: Diagnosis not present

## 2018-05-02 ENCOUNTER — Encounter: Payer: Self-pay | Admitting: Urology

## 2018-05-02 ENCOUNTER — Ambulatory Visit (INDEPENDENT_AMBULATORY_CARE_PROVIDER_SITE_OTHER): Payer: Medicare Other | Admitting: Urology

## 2018-05-02 VITALS — BP 171/92 | HR 85 | Ht 70.0 in | Wt 280.0 lb

## 2018-05-02 DIAGNOSIS — C61 Malignant neoplasm of prostate: Secondary | ICD-10-CM | POA: Diagnosis not present

## 2018-05-02 MED ORDER — LEVOFLOXACIN 500 MG PO TABS: 500.0000 mg | ORAL_TABLET | Freq: Once | ORAL | Status: DC

## 2018-05-02 MED ORDER — GENTAMICIN SULFATE 40 MG/ML IJ SOLN: 80.0000 mg | Freq: Once | INTRAMUSCULAR | Status: DC

## 2018-05-02 NOTE — Progress Notes (Signed)
05/02/18  CC: gold seed markers  HPI: 73 y.o. male with prostate cancer who presents today for placement of fiducial seed markers in anticipation of his upcoming IMRT with Dr. Baruch Gouty.  Prostate Gold Seed Marker Placement Procedure   Informed consent was obtained after discussing risks/benefits of the procedure.  A time out was performed to ensure correct patient identity.  Pre-Procedure: - Gentamicin given prophylactically - PO Levaquin 500 mg also given today  Procedure: -Lidocaine jelly was administered per rectum -Rectal ultrasound probe was placed without difficulty and the prostate visualized - 3 fiducial gold seed markers placed, one at right base, one at left base, one at apex of prostate gland under transrectal ultrasound guidance  Post-Procedure: - Patient tolerated the procedure well - He was counseled to seek immediate medical attention if experiences any severe pain, significant bleeding, or fevers -f/u with Dr. Diamantina Providence post radiation  Hollice Espy, MD

## 2018-05-06 ENCOUNTER — Ambulatory Visit
Admission: RE | Admit: 2018-05-06 | Discharge: 2018-05-06 | Disposition: A | Discharge status: Home / Self Care | Payer: Medicare Other | Source: Ambulatory Visit | Attending: Radiation Oncology | Admitting: Radiation Oncology

## 2018-05-06 DIAGNOSIS — Z510 Encounter for antineoplastic radiation therapy: Secondary | ICD-10-CM | POA: Insufficient documentation

## 2018-05-06 DIAGNOSIS — C61 Malignant neoplasm of prostate: Secondary | ICD-10-CM | POA: Insufficient documentation

## 2018-05-07 DIAGNOSIS — Z510 Encounter for antineoplastic radiation therapy: Secondary | ICD-10-CM | POA: Diagnosis not present

## 2018-05-13 ENCOUNTER — Other Ambulatory Visit: Payer: Self-pay | Admitting: *Deleted

## 2018-05-13 DIAGNOSIS — C61 Malignant neoplasm of prostate: Secondary | ICD-10-CM

## 2018-05-15 ENCOUNTER — Ambulatory Visit
Admission: RE | Admit: 2018-05-15 | Discharge: 2018-05-15 | Disposition: A | Discharge status: Home / Self Care | Payer: Medicare Other | Source: Ambulatory Visit | Attending: Radiation Oncology | Admitting: Radiation Oncology

## 2018-05-15 DIAGNOSIS — C61 Malignant neoplasm of prostate: Secondary | ICD-10-CM | POA: Insufficient documentation

## 2018-05-15 DIAGNOSIS — Z510 Encounter for antineoplastic radiation therapy: Secondary | ICD-10-CM | POA: Insufficient documentation

## 2018-05-16 ENCOUNTER — Ambulatory Visit
Admission: RE | Admit: 2018-05-16 | Discharge: 2018-05-16 | Disposition: A | Discharge status: Home / Self Care | Payer: Medicare Other | Source: Ambulatory Visit | Attending: Radiation Oncology | Admitting: Radiation Oncology

## 2018-05-16 DIAGNOSIS — C61 Malignant neoplasm of prostate: Secondary | ICD-10-CM | POA: Diagnosis not present

## 2018-05-16 DIAGNOSIS — Z510 Encounter for antineoplastic radiation therapy: Secondary | ICD-10-CM | POA: Diagnosis present

## 2018-05-17 ENCOUNTER — Ambulatory Visit
Admission: RE | Admit: 2018-05-17 | Discharge: 2018-05-17 | Disposition: A | Discharge status: Home / Self Care | Payer: Medicare Other | Source: Ambulatory Visit | Attending: Radiation Oncology | Admitting: Radiation Oncology

## 2018-05-17 DIAGNOSIS — Z510 Encounter for antineoplastic radiation therapy: Secondary | ICD-10-CM | POA: Diagnosis not present

## 2018-05-20 ENCOUNTER — Ambulatory Visit
Admission: RE | Admit: 2018-05-20 | Discharge: 2018-05-20 | Disposition: A | Discharge status: Home / Self Care | Payer: Medicare Other | Source: Ambulatory Visit | Attending: Radiation Oncology | Admitting: Radiation Oncology

## 2018-05-20 DIAGNOSIS — Z510 Encounter for antineoplastic radiation therapy: Secondary | ICD-10-CM | POA: Diagnosis not present

## 2018-05-21 ENCOUNTER — Ambulatory Visit
Admission: RE | Admit: 2018-05-21 | Discharge: 2018-05-21 | Disposition: A | Discharge status: Home / Self Care | Payer: Medicare Other | Source: Ambulatory Visit | Attending: Radiation Oncology | Admitting: Radiation Oncology

## 2018-05-21 DIAGNOSIS — Z510 Encounter for antineoplastic radiation therapy: Secondary | ICD-10-CM | POA: Diagnosis not present

## 2018-05-22 ENCOUNTER — Ambulatory Visit
Admission: RE | Admit: 2018-05-22 | Discharge: 2018-05-22 | Disposition: A | Discharge status: Home / Self Care | Payer: Medicare Other | Source: Ambulatory Visit | Attending: Radiation Oncology | Admitting: Radiation Oncology

## 2018-05-22 DIAGNOSIS — Z510 Encounter for antineoplastic radiation therapy: Secondary | ICD-10-CM | POA: Diagnosis not present

## 2018-05-23 ENCOUNTER — Ambulatory Visit
Admission: RE | Admit: 2018-05-23 | Discharge: 2018-05-23 | Disposition: A | Discharge status: Home / Self Care | Payer: Medicare Other | Source: Ambulatory Visit | Attending: Radiation Oncology | Admitting: Radiation Oncology

## 2018-05-23 DIAGNOSIS — Z510 Encounter for antineoplastic radiation therapy: Secondary | ICD-10-CM | POA: Diagnosis not present

## 2018-05-24 ENCOUNTER — Ambulatory Visit
Admission: RE | Admit: 2018-05-24 | Discharge: 2018-05-24 | Disposition: A | Discharge status: Home / Self Care | Payer: Medicare Other | Source: Ambulatory Visit | Attending: Radiation Oncology | Admitting: Radiation Oncology

## 2018-05-24 DIAGNOSIS — Z510 Encounter for antineoplastic radiation therapy: Secondary | ICD-10-CM | POA: Diagnosis not present

## 2018-05-27 ENCOUNTER — Ambulatory Visit
Admission: RE | Admit: 2018-05-27 | Discharge: 2018-05-27 | Disposition: A | Discharge status: Home / Self Care | Payer: Medicare Other | Source: Ambulatory Visit | Attending: Radiation Oncology | Admitting: Radiation Oncology

## 2018-05-27 DIAGNOSIS — Z510 Encounter for antineoplastic radiation therapy: Secondary | ICD-10-CM | POA: Diagnosis not present

## 2018-05-28 ENCOUNTER — Ambulatory Visit
Admission: RE | Admit: 2018-05-28 | Discharge: 2018-05-28 | Disposition: A | Discharge status: Home / Self Care | Payer: Medicare Other | Source: Ambulatory Visit | Attending: Radiation Oncology | Admitting: Radiation Oncology

## 2018-05-28 DIAGNOSIS — Z510 Encounter for antineoplastic radiation therapy: Secondary | ICD-10-CM | POA: Diagnosis not present

## 2018-05-29 ENCOUNTER — Ambulatory Visit
Admission: RE | Admit: 2018-05-29 | Discharge: 2018-05-29 | Disposition: A | Discharge status: Home / Self Care | Payer: Medicare Other | Source: Ambulatory Visit | Attending: Radiation Oncology | Admitting: Radiation Oncology

## 2018-05-29 DIAGNOSIS — Z510 Encounter for antineoplastic radiation therapy: Secondary | ICD-10-CM | POA: Diagnosis not present

## 2018-05-30 ENCOUNTER — Other Ambulatory Visit: Payer: Self-pay

## 2018-05-30 ENCOUNTER — Ambulatory Visit
Admission: RE | Admit: 2018-05-30 | Discharge: 2018-05-30 | Disposition: A | Discharge status: Home / Self Care | Payer: Medicare Other | Source: Ambulatory Visit | Attending: Radiation Oncology | Admitting: Radiation Oncology

## 2018-05-30 ENCOUNTER — Inpatient Hospital Stay: Payer: Medicare Other | Attending: Radiation Oncology

## 2018-05-30 DIAGNOSIS — D72829 Elevated white blood cell count, unspecified: Secondary | ICD-10-CM | POA: Diagnosis present

## 2018-05-30 DIAGNOSIS — C61 Malignant neoplasm of prostate: Secondary | ICD-10-CM

## 2018-05-30 DIAGNOSIS — Z87891 Personal history of nicotine dependence: Secondary | ICD-10-CM | POA: Insufficient documentation

## 2018-05-30 DIAGNOSIS — Z79899 Other long term (current) drug therapy: Secondary | ICD-10-CM | POA: Insufficient documentation

## 2018-05-30 DIAGNOSIS — Z510 Encounter for antineoplastic radiation therapy: Secondary | ICD-10-CM | POA: Diagnosis not present

## 2018-05-30 LAB — CBC
HCT: 37.6 % — ABNORMAL LOW (ref 39.0–52.0)
HEMOGLOBIN: 12.6 g/dL — AB (ref 13.0–17.0)
MCH: 31.1 pg (ref 26.0–34.0)
MCHC: 33.5 g/dL (ref 30.0–36.0)
MCV: 92.8 fL (ref 80.0–100.0)
Platelets: 115 10*3/uL — ABNORMAL LOW (ref 150–400)
RBC: 4.05 MIL/uL — ABNORMAL LOW (ref 4.22–5.81)
RDW: 13.4 % (ref 11.5–15.5)
WBC: 6.7 10*3/uL (ref 4.0–10.5)
nRBC: 0 % (ref 0.0–0.2)

## 2018-05-31 ENCOUNTER — Ambulatory Visit
Admission: RE | Admit: 2018-05-31 | Discharge: 2018-05-31 | Disposition: A | Discharge status: Home / Self Care | Payer: Medicare Other | Source: Ambulatory Visit | Attending: Radiation Oncology | Admitting: Radiation Oncology

## 2018-05-31 DIAGNOSIS — Z510 Encounter for antineoplastic radiation therapy: Secondary | ICD-10-CM | POA: Diagnosis not present

## 2018-06-03 ENCOUNTER — Ambulatory Visit
Admission: RE | Admit: 2018-06-03 | Discharge: 2018-06-03 | Disposition: A | Discharge status: Home / Self Care | Payer: Medicare Other | Source: Ambulatory Visit | Attending: Radiation Oncology | Admitting: Radiation Oncology

## 2018-06-03 DIAGNOSIS — Z510 Encounter for antineoplastic radiation therapy: Secondary | ICD-10-CM | POA: Diagnosis not present

## 2018-06-04 ENCOUNTER — Ambulatory Visit
Admission: RE | Admit: 2018-06-04 | Discharge: 2018-06-04 | Disposition: A | Discharge status: Home / Self Care | Payer: Medicare Other | Source: Ambulatory Visit | Attending: Radiation Oncology | Admitting: Radiation Oncology

## 2018-06-04 DIAGNOSIS — Z510 Encounter for antineoplastic radiation therapy: Secondary | ICD-10-CM | POA: Diagnosis not present

## 2018-06-05 ENCOUNTER — Ambulatory Visit
Admission: RE | Admit: 2018-06-05 | Discharge: 2018-06-05 | Disposition: A | Discharge status: Home / Self Care | Payer: Medicare Other | Source: Ambulatory Visit | Attending: Radiation Oncology | Admitting: Radiation Oncology

## 2018-06-05 DIAGNOSIS — Z510 Encounter for antineoplastic radiation therapy: Secondary | ICD-10-CM | POA: Diagnosis not present

## 2018-06-06 ENCOUNTER — Ambulatory Visit
Admission: RE | Admit: 2018-06-06 | Discharge: 2018-06-06 | Disposition: A | Discharge status: Home / Self Care | Payer: Medicare Other | Source: Ambulatory Visit | Attending: Radiation Oncology | Admitting: Radiation Oncology

## 2018-06-06 DIAGNOSIS — Z510 Encounter for antineoplastic radiation therapy: Secondary | ICD-10-CM | POA: Diagnosis not present

## 2018-06-07 ENCOUNTER — Ambulatory Visit
Admission: RE | Admit: 2018-06-07 | Discharge: 2018-06-07 | Disposition: A | Discharge status: Home / Self Care | Payer: Medicare Other | Source: Ambulatory Visit | Attending: Radiation Oncology | Admitting: Radiation Oncology

## 2018-06-07 DIAGNOSIS — Z510 Encounter for antineoplastic radiation therapy: Secondary | ICD-10-CM | POA: Diagnosis not present

## 2018-06-10 ENCOUNTER — Ambulatory Visit
Admission: RE | Admit: 2018-06-10 | Discharge: 2018-06-10 | Disposition: A | Discharge status: Home / Self Care | Payer: Medicare Other | Source: Ambulatory Visit | Attending: Radiation Oncology | Admitting: Radiation Oncology

## 2018-06-10 DIAGNOSIS — C61 Malignant neoplasm of prostate: Secondary | ICD-10-CM | POA: Diagnosis not present

## 2018-06-10 DIAGNOSIS — Z510 Encounter for antineoplastic radiation therapy: Secondary | ICD-10-CM | POA: Diagnosis not present

## 2018-06-11 ENCOUNTER — Ambulatory Visit
Admission: RE | Admit: 2018-06-11 | Discharge: 2018-06-11 | Disposition: A | Discharge status: Home / Self Care | Payer: Medicare Other | Source: Ambulatory Visit | Attending: Radiation Oncology | Admitting: Radiation Oncology

## 2018-06-11 DIAGNOSIS — Z510 Encounter for antineoplastic radiation therapy: Secondary | ICD-10-CM | POA: Diagnosis not present

## 2018-06-12 ENCOUNTER — Ambulatory Visit
Admission: RE | Admit: 2018-06-12 | Discharge: 2018-06-12 | Disposition: A | Discharge status: Home / Self Care | Payer: Medicare Other | Source: Ambulatory Visit | Attending: Radiation Oncology | Admitting: Radiation Oncology

## 2018-06-12 ENCOUNTER — Other Ambulatory Visit: Payer: Self-pay | Admitting: *Deleted

## 2018-06-12 DIAGNOSIS — Z510 Encounter for antineoplastic radiation therapy: Secondary | ICD-10-CM | POA: Diagnosis not present

## 2018-06-12 MED ORDER — TAMSULOSIN HCL 0.4 MG PO CAPS: 0.4000 mg | ORAL_CAPSULE | Freq: Every day | ORAL | 6 refills | Status: DC

## 2018-06-13 ENCOUNTER — Ambulatory Visit
Admission: RE | Admit: 2018-06-13 | Discharge: 2018-06-13 | Disposition: A | Discharge status: Home / Self Care | Payer: Medicare Other | Source: Ambulatory Visit | Attending: Radiation Oncology | Admitting: Radiation Oncology

## 2018-06-13 ENCOUNTER — Inpatient Hospital Stay: Payer: Medicare Other | Attending: Radiation Oncology

## 2018-06-13 DIAGNOSIS — C61 Malignant neoplasm of prostate: Secondary | ICD-10-CM | POA: Insufficient documentation

## 2018-06-13 DIAGNOSIS — Z510 Encounter for antineoplastic radiation therapy: Secondary | ICD-10-CM | POA: Diagnosis not present

## 2018-06-13 LAB — CBC
HCT: 37.1 % — ABNORMAL LOW (ref 39.0–52.0)
Hemoglobin: 12.5 g/dL — ABNORMAL LOW (ref 13.0–17.0)
MCH: 31 pg (ref 26.0–34.0)
MCHC: 33.7 g/dL (ref 30.0–36.0)
MCV: 92.1 fL (ref 80.0–100.0)
Platelets: 138 10*3/uL — ABNORMAL LOW (ref 150–400)
RBC: 4.03 MIL/uL — ABNORMAL LOW (ref 4.22–5.81)
RDW: 13.7 % (ref 11.5–15.5)
WBC: 7.2 10*3/uL (ref 4.0–10.5)
nRBC: 0 % (ref 0.0–0.2)

## 2018-06-14 ENCOUNTER — Ambulatory Visit
Admission: RE | Admit: 2018-06-14 | Discharge: 2018-06-14 | Disposition: A | Discharge status: Home / Self Care | Payer: Medicare Other | Source: Ambulatory Visit | Attending: Radiation Oncology | Admitting: Radiation Oncology

## 2018-06-14 DIAGNOSIS — Z510 Encounter for antineoplastic radiation therapy: Secondary | ICD-10-CM | POA: Diagnosis not present

## 2018-06-17 ENCOUNTER — Ambulatory Visit
Admission: RE | Admit: 2018-06-17 | Discharge: 2018-06-17 | Disposition: A | Discharge status: Home / Self Care | Payer: Medicare Other | Source: Ambulatory Visit | Attending: Radiation Oncology | Admitting: Radiation Oncology

## 2018-06-17 DIAGNOSIS — Z510 Encounter for antineoplastic radiation therapy: Secondary | ICD-10-CM | POA: Diagnosis not present

## 2018-06-18 ENCOUNTER — Ambulatory Visit
Admission: RE | Admit: 2018-06-18 | Discharge: 2018-06-18 | Disposition: A | Discharge status: Home / Self Care | Payer: Medicare Other | Source: Ambulatory Visit | Attending: Radiation Oncology | Admitting: Radiation Oncology

## 2018-06-18 DIAGNOSIS — Z510 Encounter for antineoplastic radiation therapy: Secondary | ICD-10-CM | POA: Diagnosis not present

## 2018-06-19 ENCOUNTER — Ambulatory Visit
Admission: RE | Admit: 2018-06-19 | Discharge: 2018-06-19 | Disposition: A | Discharge status: Home / Self Care | Payer: Medicare Other | Source: Ambulatory Visit | Attending: Radiation Oncology | Admitting: Radiation Oncology

## 2018-06-19 DIAGNOSIS — Z510 Encounter for antineoplastic radiation therapy: Secondary | ICD-10-CM | POA: Diagnosis not present

## 2018-06-20 ENCOUNTER — Other Ambulatory Visit: Payer: Self-pay

## 2018-06-20 ENCOUNTER — Ambulatory Visit
Admission: RE | Admit: 2018-06-20 | Discharge: 2018-06-20 | Disposition: A | Discharge status: Home / Self Care | Payer: Medicare Other | Source: Ambulatory Visit | Attending: Radiation Oncology | Admitting: Radiation Oncology

## 2018-06-20 DIAGNOSIS — Z510 Encounter for antineoplastic radiation therapy: Secondary | ICD-10-CM | POA: Diagnosis not present

## 2018-06-21 ENCOUNTER — Other Ambulatory Visit: Payer: Self-pay

## 2018-06-21 ENCOUNTER — Ambulatory Visit
Admission: RE | Admit: 2018-06-21 | Discharge: 2018-06-21 | Disposition: A | Discharge status: Home / Self Care | Payer: Medicare Other | Source: Ambulatory Visit | Attending: Radiation Oncology | Admitting: Radiation Oncology

## 2018-06-21 DIAGNOSIS — Z510 Encounter for antineoplastic radiation therapy: Secondary | ICD-10-CM | POA: Diagnosis not present

## 2018-06-24 ENCOUNTER — Ambulatory Visit
Admission: RE | Admit: 2018-06-24 | Discharge: 2018-06-24 | Disposition: A | Discharge status: Home / Self Care | Payer: Medicare Other | Source: Ambulatory Visit | Attending: Radiation Oncology | Admitting: Radiation Oncology

## 2018-06-24 ENCOUNTER — Other Ambulatory Visit: Payer: Self-pay

## 2018-06-24 DIAGNOSIS — Z510 Encounter for antineoplastic radiation therapy: Secondary | ICD-10-CM | POA: Diagnosis not present

## 2018-06-25 ENCOUNTER — Ambulatory Visit
Admission: RE | Admit: 2018-06-25 | Discharge: 2018-06-25 | Disposition: A | Discharge status: Home / Self Care | Payer: Medicare Other | Source: Ambulatory Visit | Attending: Radiation Oncology | Admitting: Radiation Oncology

## 2018-06-25 ENCOUNTER — Other Ambulatory Visit: Payer: Self-pay

## 2018-06-25 DIAGNOSIS — Z510 Encounter for antineoplastic radiation therapy: Secondary | ICD-10-CM | POA: Diagnosis not present

## 2018-06-26 ENCOUNTER — Other Ambulatory Visit: Payer: Self-pay

## 2018-06-26 ENCOUNTER — Ambulatory Visit
Admission: RE | Admit: 2018-06-26 | Discharge: 2018-06-26 | Disposition: A | Discharge status: Home / Self Care | Payer: Medicare Other | Source: Ambulatory Visit | Attending: Radiation Oncology | Admitting: Radiation Oncology

## 2018-06-26 DIAGNOSIS — Z510 Encounter for antineoplastic radiation therapy: Secondary | ICD-10-CM | POA: Diagnosis not present

## 2018-06-27 ENCOUNTER — Other Ambulatory Visit: Payer: Self-pay

## 2018-06-27 ENCOUNTER — Inpatient Hospital Stay: Payer: Medicare Other | Attending: Radiation Oncology

## 2018-06-27 ENCOUNTER — Ambulatory Visit
Admission: RE | Admit: 2018-06-27 | Discharge: 2018-06-27 | Disposition: A | Discharge status: Home / Self Care | Payer: Medicare Other | Source: Ambulatory Visit | Attending: Radiation Oncology | Admitting: Radiation Oncology

## 2018-06-27 DIAGNOSIS — D72829 Elevated white blood cell count, unspecified: Secondary | ICD-10-CM | POA: Diagnosis present

## 2018-06-27 DIAGNOSIS — C61 Malignant neoplasm of prostate: Secondary | ICD-10-CM

## 2018-06-27 DIAGNOSIS — Z510 Encounter for antineoplastic radiation therapy: Secondary | ICD-10-CM | POA: Diagnosis not present

## 2018-06-27 LAB — CBC
HCT: 37.6 % — ABNORMAL LOW (ref 39.0–52.0)
Hemoglobin: 12.6 g/dL — ABNORMAL LOW (ref 13.0–17.0)
MCH: 31.3 pg (ref 26.0–34.0)
MCHC: 33.5 g/dL (ref 30.0–36.0)
MCV: 93.3 fL (ref 80.0–100.0)
PLATELETS: 133 10*3/uL — AB (ref 150–400)
RBC: 4.03 MIL/uL — ABNORMAL LOW (ref 4.22–5.81)
RDW: 13.9 % (ref 11.5–15.5)
WBC: 6.6 10*3/uL (ref 4.0–10.5)
nRBC: 0 % (ref 0.0–0.2)

## 2018-06-28 ENCOUNTER — Ambulatory Visit
Admission: RE | Admit: 2018-06-28 | Discharge: 2018-06-28 | Disposition: A | Discharge status: Home / Self Care | Payer: Medicare Other | Source: Ambulatory Visit | Attending: Radiation Oncology | Admitting: Radiation Oncology

## 2018-06-28 ENCOUNTER — Other Ambulatory Visit: Payer: Self-pay

## 2018-06-28 DIAGNOSIS — Z510 Encounter for antineoplastic radiation therapy: Secondary | ICD-10-CM | POA: Diagnosis not present

## 2018-07-01 ENCOUNTER — Ambulatory Visit
Admission: RE | Admit: 2018-07-01 | Discharge: 2018-07-01 | Disposition: A | Discharge status: Home / Self Care | Payer: Medicare Other | Source: Ambulatory Visit | Attending: Radiation Oncology | Admitting: Radiation Oncology

## 2018-07-01 ENCOUNTER — Other Ambulatory Visit: Payer: Self-pay

## 2018-07-01 DIAGNOSIS — Z510 Encounter for antineoplastic radiation therapy: Secondary | ICD-10-CM | POA: Diagnosis not present

## 2018-07-02 ENCOUNTER — Other Ambulatory Visit: Payer: Self-pay

## 2018-07-02 ENCOUNTER — Ambulatory Visit
Admission: RE | Admit: 2018-07-02 | Discharge: 2018-07-02 | Disposition: A | Discharge status: Home / Self Care | Payer: Medicare Other | Source: Ambulatory Visit | Attending: Radiation Oncology | Admitting: Radiation Oncology

## 2018-07-02 DIAGNOSIS — Z510 Encounter for antineoplastic radiation therapy: Secondary | ICD-10-CM | POA: Diagnosis not present

## 2018-07-03 ENCOUNTER — Other Ambulatory Visit: Payer: Self-pay

## 2018-07-03 ENCOUNTER — Ambulatory Visit
Admission: RE | Admit: 2018-07-03 | Discharge: 2018-07-03 | Disposition: A | Discharge status: Home / Self Care | Payer: Medicare Other | Source: Ambulatory Visit | Attending: Radiation Oncology | Admitting: Radiation Oncology

## 2018-07-03 DIAGNOSIS — Z510 Encounter for antineoplastic radiation therapy: Secondary | ICD-10-CM | POA: Diagnosis not present

## 2018-07-04 ENCOUNTER — Other Ambulatory Visit: Payer: Self-pay

## 2018-07-04 ENCOUNTER — Ambulatory Visit
Admission: RE | Admit: 2018-07-04 | Discharge: 2018-07-04 | Disposition: A | Discharge status: Home / Self Care | Payer: Medicare Other | Source: Ambulatory Visit | Attending: Radiation Oncology | Admitting: Radiation Oncology

## 2018-07-04 DIAGNOSIS — Z510 Encounter for antineoplastic radiation therapy: Secondary | ICD-10-CM | POA: Diagnosis not present

## 2018-07-05 ENCOUNTER — Other Ambulatory Visit: Payer: Self-pay

## 2018-07-05 ENCOUNTER — Ambulatory Visit
Admission: RE | Admit: 2018-07-05 | Discharge: 2018-07-05 | Disposition: A | Discharge status: Home / Self Care | Payer: Medicare Other | Source: Ambulatory Visit | Attending: Radiation Oncology | Admitting: Radiation Oncology

## 2018-07-05 DIAGNOSIS — Z510 Encounter for antineoplastic radiation therapy: Secondary | ICD-10-CM | POA: Diagnosis not present

## 2018-07-08 ENCOUNTER — Other Ambulatory Visit: Payer: Self-pay

## 2018-07-08 ENCOUNTER — Ambulatory Visit
Admission: RE | Admit: 2018-07-08 | Discharge: 2018-07-08 | Disposition: A | Discharge status: Home / Self Care | Payer: Medicare Other | Source: Ambulatory Visit | Attending: Radiation Oncology | Admitting: Radiation Oncology

## 2018-07-08 DIAGNOSIS — Z510 Encounter for antineoplastic radiation therapy: Secondary | ICD-10-CM | POA: Diagnosis not present

## 2018-07-09 ENCOUNTER — Ambulatory Visit
Admission: RE | Admit: 2018-07-09 | Discharge: 2018-07-09 | Disposition: A | Discharge status: Home / Self Care | Payer: Medicare Other | Source: Ambulatory Visit | Attending: Radiation Oncology | Admitting: Radiation Oncology

## 2018-07-09 ENCOUNTER — Other Ambulatory Visit: Payer: Self-pay

## 2018-07-09 DIAGNOSIS — Z510 Encounter for antineoplastic radiation therapy: Secondary | ICD-10-CM | POA: Diagnosis not present

## 2018-07-10 ENCOUNTER — Ambulatory Visit
Admission: RE | Admit: 2018-07-10 | Discharge: 2018-07-10 | Disposition: A | Discharge status: Home / Self Care | Payer: Medicare Other | Source: Ambulatory Visit | Attending: Radiation Oncology | Admitting: Radiation Oncology

## 2018-07-10 ENCOUNTER — Other Ambulatory Visit: Payer: Self-pay

## 2018-07-10 DIAGNOSIS — Z510 Encounter for antineoplastic radiation therapy: Secondary | ICD-10-CM | POA: Insufficient documentation

## 2018-07-10 DIAGNOSIS — C61 Malignant neoplasm of prostate: Secondary | ICD-10-CM | POA: Insufficient documentation

## 2018-08-13 ENCOUNTER — Other Ambulatory Visit: Payer: Self-pay

## 2018-08-14 ENCOUNTER — Other Ambulatory Visit: Payer: Self-pay

## 2018-08-14 ENCOUNTER — Ambulatory Visit
Admission: RE | Admit: 2018-08-14 | Discharge: 2018-08-14 | Disposition: A | Discharge status: Home / Self Care | Payer: Medicare Other | Source: Ambulatory Visit | Attending: Radiation Oncology | Admitting: Radiation Oncology

## 2018-08-14 ENCOUNTER — Other Ambulatory Visit: Payer: Self-pay | Admitting: *Deleted

## 2018-08-14 ENCOUNTER — Encounter: Payer: Self-pay | Admitting: Radiation Oncology

## 2018-08-14 VITALS — BP 169/95 | HR 77 | Temp 98.4°F | Resp 20 | Wt 289.5 lb

## 2018-08-14 DIAGNOSIS — C61 Malignant neoplasm of prostate: Secondary | ICD-10-CM

## 2018-08-14 DIAGNOSIS — Z923 Personal history of irradiation: Secondary | ICD-10-CM | POA: Insufficient documentation

## 2018-08-14 NOTE — Progress Notes (Signed)
Radiation Oncology Follow up Note  Name: Leslie Prince   Date:   08/14/2018 MRN:  [id number] DOB: Aug 07, 1945    This 73 y.o. male presents to the clinic today for 1 month follow-up status post external beam radiation therapy for stage IIb (T2 N0 M0) Gleason 8 (4+4) adenocarcinoma of the prostate presenting with a PSA of 5.9..  REFERRING PROVIDER: [name], MD  HPI: Patient is a 73 year old male now seen at 1 month having completed radiation therapy to his prostate and pelvic nodes for Gleason 8 adenocarcinoma of the prostate.  Seen today in routine follow-up he is doing well.  He still has occasional loose bowel movements no real diarrhea.  He believes that is diet related.  He specifically denies any exacerbation of lower urinary tract symptoms.  He is currently on Lupron therapy and that will continue..  COMPLICATIONS OF TREATMENT: none  FOLLOW UP COMPLIANCE: keeps appointments   PHYSICAL EXAM:  BP (!) 169/95   Pulse 77   Temp 98.4 F (36.9 C)   Resp 20   Wt 289 lb 7.4 oz (131.3 kg)   BMI 41.53 kg/m  Well-developed well-nourished patient in NAD. HEENT reveals PERLA, EOMI, discs not visualized.  Oral cavity is clear. No oral mucosal lesions are identified. Neck is clear without evidence of cervical or supraclavicular adenopathy. Lungs are clear to A&P. Cardiac examination is essentially unremarkable with regular rate and rhythm without murmur rub or thrill. Abdomen is benign with no organomegaly or masses noted. Motor sensory and DTR levels are equal and symmetric in the upper and lower extremities. Cranial nerves II through XII are grossly intact. Proprioception is intact. No peripheral adenopathy or edema is identified. No motor or sensory levels are noted. Crude visual fields are within normal range.  Well-developed well-nourished patient in NAD. HEENT reveals PERLA, EOMI, discs not visualized.  Oral cavity is clear. No oral mucosal lesions are identified. Neck is clear without  evidence of cervical or supraclavicular adenopathy. Lungs are clear to A&P. Cardiac examination is essentially unremarkable with regular rate and rhythm without murmur rub or thrill. Abdomen is benign with no organomegaly or masses noted. Motor sensory and DTR levels are equal and symmetric in the upper and lower extremities. Cranial nerves II through XII are grossly intact. Proprioception is intact. No peripheral adenopathy or edema is identified. No motor or sensory levels are noted. Crude visual fields are within normal range.  RADIOLOGY RESULTS: No current films for review  PLAN: Present time patient is doing well with low side effect profile from his prior image guided radiation therapy.  And pleased with his overall progress.  I have asked to see him back in 3 months and will have a PSA obtained at that time.  Patient continues on androgen deprivation therapy.  Patient is to call with any concerns.  I would like to take this opportunity to thank you for allowing me to participate in the care of your patient.Noreene Filbert, MD

## 2018-09-24 ENCOUNTER — Other Ambulatory Visit: Payer: Self-pay | Admitting: Family Medicine

## 2018-09-27 ENCOUNTER — Other Ambulatory Visit: Payer: Self-pay | Admitting: Family Medicine

## 2018-09-27 DIAGNOSIS — R222 Localized swelling, mass and lump, trunk: Secondary | ICD-10-CM

## 2018-09-27 DIAGNOSIS — I712 Thoracic aortic aneurysm, without rupture, unspecified: Secondary | ICD-10-CM

## 2018-09-27 DIAGNOSIS — R053 Chronic cough: Secondary | ICD-10-CM

## 2018-09-27 DIAGNOSIS — R05 Cough: Secondary | ICD-10-CM

## 2018-10-03 ENCOUNTER — Other Ambulatory Visit: Payer: Self-pay

## 2018-10-03 ENCOUNTER — Ambulatory Visit
Admission: RE | Admit: 2018-10-03 | Discharge: 2018-10-03 | Disposition: A | Discharge status: Home / Self Care | Payer: Medicare Other | Source: Ambulatory Visit | Attending: Family Medicine | Admitting: Family Medicine

## 2018-10-03 DIAGNOSIS — I712 Thoracic aortic aneurysm, without rupture, unspecified: Secondary | ICD-10-CM

## 2018-10-03 DIAGNOSIS — R053 Chronic cough: Secondary | ICD-10-CM

## 2018-10-03 DIAGNOSIS — R222 Localized swelling, mass and lump, trunk: Secondary | ICD-10-CM | POA: Insufficient documentation

## 2018-10-03 DIAGNOSIS — R05 Cough: Secondary | ICD-10-CM | POA: Diagnosis present

## 2018-10-03 MED ORDER — IOHEXOL 350 MG/ML SOLN
75.0000 mL | Freq: Once | INTRAVENOUS | Status: AC | PRN
  Administered 2018-10-03: 75 mL via INTRAVENOUS

## 2018-10-17 NOTE — Progress Notes (Signed)
10/18/2018 1:12 PM   Leslie Prince [birth year] [id number]  Referring provider: [name], MD [phone] [place],  [place] [address]  Chief Complaint  Patient presents with  . Prostate Cancer    HPI: Patient is a 73 year old male with prostate cancer who presents today for ADT therapy, second injection.  Oncology history Prostate biopsy on 03/14/2018 for a abnormal DRE with PSA of 5.9 which demonstrated a 54 g gland, PSA density of 0.11, and Gleason score 4+4 = 8 in 5/12 cores, with max core involvement of 81%.  He is already undergone staging imaging with CT A/P with contrast and bone scan that was negative for any obvious metastatic disease.  A 4.5 cm AAA was incidentally found on CT scan.  (he states he has spoken with Dr. Lovie Macadamia regarding this issue)  Dr. Erlene Quan on 05/02/2018 placed fiducial marker placement for IMRT.   Completed therapy in 07/2018.  It is recommended that he also receive 2 years of ADT.    Most recent PSA was < 0.1 on 10/01/2018.    He has a number of comorbidities including morbid obesity with BMI of 43, CAD with cardiac stent on anticoagulation with Pradaxa, congestive heart failure, and COPD.  He has very mild urinary symptoms, his main complaint is urinary frequency when he takes Lasix.  He has moderate to severe erectile dysfunction despite the use of PDE 5 inhibitors.  Since his first ADT treatment in January 2020, patient reports no hot flashes, weight changes, fractures, or depression. He reports taking Calcium 600 mg BID and Vitamin D 200mg  BID as previously instructed. He has no concerns at this time.  PMH: Past Medical History:  Diagnosis Date  . CHF (congestive heart failure) ([place])   . COPD (chronic obstructive pulmonary disease) ([place])   . Sleep apnea     Surgical History: Past Surgical History:  Procedure Laterality Date  . CARDIAC SURGERY    . COLONOSCOPY WITH PROPOFOL N/A 11/07/2017   Procedure: COLONOSCOPY WITH PROPOFOL;   Surgeon: Manya Silvas, MD;  Location: [facility] ENDOSCOPY;  Service: Endoscopy;  Laterality: N/A;  . CORONARY ANGIOPLASTY WITH STENT PLACEMENT      Home Medications:  Allergies as of 10/18/2018      Reactions   Lisinopril Other (See Comments)   Severe urination      Medication List       Accurate as of October 18, 2018  1:12 PM. If you have any questions, ask your nurse or doctor.        Advair Diskus 250-50 MCG/DOSE Aepb Generic drug: Fluticasone-Salmeterol Inhale 1 puff into the lungs 2 (two) times daily.   aspirin EC 81 MG tablet Take 81 mg by mouth daily.   atorvastatin 20 MG tablet Commonly known as: LIPITOR Take 20 mg by mouth daily.   Chromium 400 MCG Tabs Take 1 tablet by mouth 2 (two) times daily.   colchicine 0.6 MG tablet Take 0.6 mg by mouth as directed. Take 2 tablets at onset of gout flare, then 1 tablet one hour later   enalapril 20 MG tablet Commonly known as: VASOTEC Take 20 mg by mouth 2 (two) times daily.   furosemide 40 MG tablet Commonly known as: LASIX Take 40 mg by mouth daily.   Klor-Con M10 10 MEQ tablet Generic drug: potassium chloride Take 10 mEq by mouth 2 (two) times daily.   metoprolol succinate 100 MG 24 hr tablet Commonly known as: TOPROL-XL Take 100 mg by  mouth daily.   montelukast 10 MG tablet Commonly known as: SINGULAIR TAKE 1 TABLET (10 MG TOTAL) BY MOUTH NIGHTLY.   Multiple Vitamins tablet Take 1 tablet by mouth daily.   Pradaxa 150 MG Caps capsule Generic drug: dabigatran Take 150 mg by mouth 2 (two) times daily.   albuterol (2.5 MG/3ML) 0.083% nebulizer solution Commonly known as: PROVENTIL Take 2.5 mg by nebulization every 6 (six) hours as needed for wheezing.   ProAir HFA 108 (90 Base) MCG/ACT inhaler Generic drug: albuterol Inhale 2 puffs into the lungs every 6 (six) hours as needed for wheezing.   tamsulosin 0.4 MG Caps capsule Commonly known as: FLOMAX Take 1 capsule (0.4 mg total) by mouth daily after  supper.   tiotropium 18 MCG inhalation capsule Commonly known as: SPIRIVA Place 18 mcg into inhaler and inhale daily.   vitamin C 500 MG tablet Commonly known as: ASCORBIC ACID Take 500 mg by mouth daily.   vitamin E 400 UNIT capsule Take 400 Units by mouth daily.       Allergies:  Allergies  Allergen Reactions  . Lisinopril Other (See Comments)    Severe urination    Family History: History reviewed. No pertinent family history.  Social History:  reports that he quit smoking about 23 years ago. His smoking use included cigarettes. He has a 25.00 pack-year smoking history. He has quit using smokeless tobacco. He reports current alcohol use. He reports that he does not use drugs.  ROS: UROLOGY Frequent Urination?: Yes Hard to postpone urination?: No Burning/pain with urination?: No Get up at night to urinate?: Yes Leakage of urine?: No Urine stream starts and stops?: No Trouble starting stream?: No Do you have to strain to urinate?: No Blood in urine?: No Urinary tract infection?: No Sexually transmitted disease?: No Injury to kidneys or bladder?: No Painful intercourse?: No Weak stream?: No Erection problems?: No Penile pain?: No  Gastrointestinal Nausea?: No Vomiting?: No Indigestion/heartburn?: No Diarrhea?: No Constipation?: No  Constitutional Fever: No Night sweats?: No Weight loss?: No Fatigue?: No  Skin Skin rash/lesions?: No Itching?: No  Eyes Blurred vision?: No Double vision?: No  Ears/Nose/Throat Sore throat?: No Sinus problems?: No  Hematologic/Lymphatic Swollen glands?: No Easy bruising?: No  Cardiovascular Leg swelling?: Yes Chest pain?: No  Respiratory Cough?: No Shortness of breath?: Yes  Endocrine Excessive thirst?: No  Musculoskeletal Back pain?: No Joint pain?: No  Neurological Headaches?: No Dizziness?: No  Psychologic Depression?: No Anxiety?: No  Physical Exam: BP 134/76 (BP Location: Left Arm,  Patient Position: Sitting, Cuff Size: Normal)   Pulse 72   Ht 5\' 10"  (1.778 m)   Wt 290 lb (131.5 kg)   BMI 41.61 kg/m   Constitutional:  Well nourished. Alert and oriented, No acute distress. HEENT: [place] AT, moist mucus membranes.  Trachea midline, no masses. Cardiovascular: No clubbing, cyanosis, or edema. Respiratory: Normal respiratory effort, no increased work of breathing. Skin: No rashes, bruises or suspicious lesions. Neurologic: Grossly intact, no focal deficits, moving all 4 extremities. Psychiatric: Normal mood and affect.  Laboratory Data: Lab Results  Component Value Date   WBC 6.6 06/27/2018   HGB 12.6 (L) 06/27/2018   HCT 37.6 (L) 06/27/2018   MCV 93.3 06/27/2018   PLT 133 (L) 06/27/2018    Lab Results  Component Value Date   CREATININE 0.89 05/24/2017   PSA 09/23/2018 <0.1  I have reviewed the labs.   Assessment & Plan:    1. Prostate cancer ([place]) (Stage IIB Gleason 8 (  4+4) adenocarcinoma of the prostate) Patient is responding well to ADT with no concerns regarding side effects at this time. Counseled to continue calcium and vitamin D supplementation to minimize bone loss. Will continue therapy with his injection in office today and the next scheduled for 6 months from now. -Leuprolide Acetate (6 Month) (LUPRON) injection 45 mg - given today -Follow up with 3rd Lupron injection in 6 months. -Continue Calcium and Vitamin D supplementation.    No follow-ups on file.  These notes generated with voice recognition software. I apologize for typographical errors.  Debroah Loop, PA-C  [facility] [address]  [place], [place] [address] [phone]

## 2018-10-18 ENCOUNTER — Encounter: Payer: Self-pay | Admitting: Urology

## 2018-10-18 ENCOUNTER — Other Ambulatory Visit: Payer: Self-pay

## 2018-10-18 ENCOUNTER — Ambulatory Visit (INDEPENDENT_AMBULATORY_CARE_PROVIDER_SITE_OTHER): Payer: Medicare Other | Admitting: Physician Assistant

## 2018-10-18 VITALS — BP 134/76 | HR 72 | Ht 70.0 in | Wt 290.0 lb

## 2018-10-18 DIAGNOSIS — C61 Malignant neoplasm of prostate: Secondary | ICD-10-CM

## 2018-10-18 MED ORDER — LEUPROLIDE ACETATE (6 MONTH) 45 MG IM KIT
45.0000 mg | PACK | Freq: Once | INTRAMUSCULAR | Status: AC
  Administered 2018-10-18: 45 mg via INTRAMUSCULAR

## 2018-10-18 NOTE — Progress Notes (Signed)
Lupron IM Injection   Due to Prostate Cancer patient is present today for a Lupron Injection.  Medication: Lupron 6 month Dose: 45 mg  Location: RIGHT upper outer buttocks Lot: [id number] Exp: 11/19/2020  Patient tolerated well, no complications were noted  Performed by: Elberta Leatherwood, CMA  Follow up: 6 months

## 2018-10-30 ENCOUNTER — Other Ambulatory Visit: Payer: Self-pay | Admitting: Family Medicine

## 2018-10-30 DIAGNOSIS — R05 Cough: Secondary | ICD-10-CM

## 2018-10-30 DIAGNOSIS — R222 Localized swelling, mass and lump, trunk: Secondary | ICD-10-CM

## 2018-10-30 DIAGNOSIS — I712 Thoracic aortic aneurysm, without rupture, unspecified: Secondary | ICD-10-CM

## 2018-10-30 DIAGNOSIS — R053 Chronic cough: Secondary | ICD-10-CM

## 2018-11-12 ENCOUNTER — Other Ambulatory Visit: Payer: Self-pay

## 2018-11-13 ENCOUNTER — Inpatient Hospital Stay: Payer: Medicare Other | Attending: Radiation Oncology

## 2018-11-13 ENCOUNTER — Other Ambulatory Visit: Payer: Self-pay

## 2018-11-13 DIAGNOSIS — C61 Malignant neoplasm of prostate: Secondary | ICD-10-CM

## 2018-11-13 LAB — PSA: Prostatic Specific Antigen: <0.01 ng/mL (ref 0.00–4.00)

## 2018-11-15 ENCOUNTER — Emergency Department: Payer: Medicare Other

## 2018-11-15 ENCOUNTER — Encounter: Payer: Self-pay | Admitting: Emergency Medicine

## 2018-11-15 ENCOUNTER — Other Ambulatory Visit: Payer: Self-pay

## 2018-11-15 ENCOUNTER — Inpatient Hospital Stay
Admission: EM | Admit: 2018-11-15 | Discharge: 2018-11-17 | DRG: 871 | Disposition: A | Discharge status: Home / Self Care | Payer: Medicare Other | Attending: Internal Medicine | Admitting: Internal Medicine

## 2018-11-15 ENCOUNTER — Inpatient Hospital Stay: Admit: 2018-11-15 | Payer: Medicare Other

## 2018-11-15 DIAGNOSIS — J181 Lobar pneumonia, unspecified organism: Secondary | ICD-10-CM | POA: Diagnosis present

## 2018-11-15 DIAGNOSIS — G4733 Obstructive sleep apnea (adult) (pediatric): Secondary | ICD-10-CM | POA: Diagnosis present

## 2018-11-15 DIAGNOSIS — B942 Sequelae of viral hepatitis: Secondary | ICD-10-CM

## 2018-11-15 DIAGNOSIS — G4730 Sleep apnea, unspecified: Secondary | ICD-10-CM | POA: Diagnosis present

## 2018-11-15 DIAGNOSIS — J189 Pneumonia, unspecified organism: Secondary | ICD-10-CM

## 2018-11-15 DIAGNOSIS — E8770 Fluid overload, unspecified: Secondary | ICD-10-CM | POA: Diagnosis present

## 2018-11-15 DIAGNOSIS — A419 Sepsis, unspecified organism: Secondary | ICD-10-CM | POA: Diagnosis present

## 2018-11-15 DIAGNOSIS — E119 Type 2 diabetes mellitus without complications: Secondary | ICD-10-CM | POA: Diagnosis present

## 2018-11-15 DIAGNOSIS — Z888 Allergy status to other drugs, medicaments and biological substances status: Secondary | ICD-10-CM | POA: Diagnosis not present

## 2018-11-15 DIAGNOSIS — I2510 Atherosclerotic heart disease of native coronary artery without angina pectoris: Secondary | ICD-10-CM | POA: Diagnosis present

## 2018-11-15 DIAGNOSIS — I110 Hypertensive heart disease with heart failure: Secondary | ICD-10-CM | POA: Diagnosis present

## 2018-11-15 DIAGNOSIS — J440 Chronic obstructive pulmonary disease with acute lower respiratory infection: Secondary | ICD-10-CM | POA: Diagnosis present

## 2018-11-15 DIAGNOSIS — Z9989 Dependence on other enabling machines and devices: Secondary | ICD-10-CM

## 2018-11-15 DIAGNOSIS — E785 Hyperlipidemia, unspecified: Secondary | ICD-10-CM | POA: Diagnosis present

## 2018-11-15 DIAGNOSIS — Z951 Presence of aortocoronary bypass graft: Secondary | ICD-10-CM

## 2018-11-15 DIAGNOSIS — Z7901 Long term (current) use of anticoagulants: Secondary | ICD-10-CM

## 2018-11-15 DIAGNOSIS — I5033 Acute on chronic diastolic (congestive) heart failure: Secondary | ICD-10-CM | POA: Diagnosis not present

## 2018-11-15 DIAGNOSIS — Z87891 Personal history of nicotine dependence: Secondary | ICD-10-CM

## 2018-11-15 DIAGNOSIS — D696 Thrombocytopenia, unspecified: Secondary | ICD-10-CM | POA: Diagnosis present

## 2018-11-15 DIAGNOSIS — R000 Tachycardia, unspecified: Secondary | ICD-10-CM | POA: Diagnosis present

## 2018-11-15 DIAGNOSIS — Z20828 Contact with and (suspected) exposure to other viral communicable diseases: Secondary | ICD-10-CM | POA: Diagnosis present

## 2018-11-15 DIAGNOSIS — E876 Hypokalemia: Secondary | ICD-10-CM | POA: Diagnosis present

## 2018-11-15 DIAGNOSIS — I480 Paroxysmal atrial fibrillation: Secondary | ICD-10-CM | POA: Diagnosis present

## 2018-11-15 LAB — URINALYSIS, COMPLETE (UACMP) WITH MICROSCOPIC
Bacteria, UA: NONE SEEN
Bilirubin Urine: NEGATIVE
Glucose, UA: NEGATIVE mg/dL
Ketones, ur: 5 mg/dL — AB
Leukocytes,Ua: NEGATIVE
Nitrite: NEGATIVE
Protein, ur: NEGATIVE mg/dL
Specific Gravity, Urine: 1.02 (ref 1.005–1.030)
pH: 5 (ref 5.0–8.0)

## 2018-11-15 LAB — CBC WITH DIFFERENTIAL/PLATELET
Abs Immature Granulocytes: 0.07 10*3/uL (ref 0.00–0.07)
Basophils Absolute: 0 10*3/uL (ref 0.0–0.1)
Basophils Relative: 0 %
Eosinophils Absolute: 0.1 10*3/uL (ref 0.0–0.5)
Eosinophils Relative: 2 %
HCT: 33.7 % — ABNORMAL LOW (ref 39.0–52.0)
Hemoglobin: 11.1 g/dL — ABNORMAL LOW (ref 13.0–17.0)
Immature Granulocytes: 1 %
Lymphocytes Relative: 7 %
Lymphs Abs: 0.6 10*3/uL — ABNORMAL LOW (ref 0.7–4.0)
MCH: 31.3 pg (ref 26.0–34.0)
MCHC: 32.9 g/dL (ref 30.0–36.0)
MCV: 94.9 fL (ref 80.0–100.0)
Monocytes Absolute: 0.8 10*3/uL (ref 0.1–1.0)
Monocytes Relative: 9 %
Neutro Abs: 7.3 10*3/uL (ref 1.7–7.7)
Neutrophils Relative %: 81 %
Platelets: 105 10*3/uL — ABNORMAL LOW (ref 150–400)
RBC: 3.55 MIL/uL — ABNORMAL LOW (ref 4.22–5.81)
RDW: 13.3 % (ref 11.5–15.5)
WBC: 9 10*3/uL (ref 4.0–10.5)
nRBC: 0 % (ref 0.0–0.2)

## 2018-11-15 LAB — COMPREHENSIVE METABOLIC PANEL
ALT: 15 U/L (ref 0–44)
AST: 22 U/L (ref 15–41)
Albumin: 3.6 g/dL (ref 3.5–5.0)
Alkaline Phosphatase: 35 U/L — ABNORMAL LOW (ref 38–126)
Anion gap: 8 (ref 5–15)
BUN: 13 mg/dL (ref 8–23)
CO2: 28 mmol/L (ref 22–32)
Calcium: 8.7 mg/dL — ABNORMAL LOW (ref 8.9–10.3)
Chloride: 103 mmol/L (ref 98–111)
Creatinine, Ser: 0.75 mg/dL (ref 0.61–1.24)
GFR calc Af Amer: >60 mL/min (ref >60)
GFR calc non Af Amer: >60 mL/min (ref >60)
Glucose, Bld: 112 mg/dL — ABNORMAL HIGH (ref 70–99)
Potassium: 3.2 mmol/L — ABNORMAL LOW (ref 3.5–5.1)
Sodium: 139 mmol/L (ref 135–145)
Total Bilirubin: 2.1 mg/dL — ABNORMAL HIGH (ref 0.3–1.2)
Total Protein: 6.7 g/dL (ref 6.5–8.1)

## 2018-11-15 LAB — LACTIC ACID, PLASMA: Lactic Acid, Venous: 0.9 mmol/L (ref 0.5–1.9)

## 2018-11-15 LAB — APTT: aPTT: 59 seconds — ABNORMAL HIGH (ref 24–36)

## 2018-11-15 LAB — PROTIME-INR
INR: 1.7 — ABNORMAL HIGH (ref 0.8–1.2)
Prothrombin Time: 19.4 seconds — ABNORMAL HIGH (ref 11.4–15.2)

## 2018-11-15 LAB — LIPASE, BLOOD: Lipase: 21 U/L (ref 11–51)

## 2018-11-15 LAB — BRAIN NATRIURETIC PEPTIDE: B Natriuretic Peptide: 379 pg/mL — ABNORMAL HIGH (ref 0.0–100.0)

## 2018-11-15 LAB — SARS CORONAVIRUS 2 BY RT PCR (HOSPITAL ORDER, PERFORMED IN ~~LOC~~ HOSPITAL LAB): SARS Coronavirus 2: NEGATIVE

## 2018-11-15 LAB — PROCALCITONIN: Procalcitonin: <0.1 ng/mL

## 2018-11-15 MED ORDER — TIOTROPIUM BROMIDE MONOHYDRATE 18 MCG IN CAPS
18.0000 ug | ORAL_CAPSULE | Freq: Every day | RESPIRATORY_TRACT | Status: DC
  Administered 2018-11-15 – 2018-11-17 (×3): 18 ug via RESPIRATORY_TRACT
  Filled 2018-11-15: qty 5

## 2018-11-15 MED ORDER — SODIUM CHLORIDE 0.9% FLUSH: 3.0000 mL | INTRAVENOUS | Status: DC | PRN

## 2018-11-15 MED ORDER — ADULT MULTIVITAMIN W/MINERALS CH
1.0000 | ORAL_TABLET | Freq: Every day | ORAL | Status: DC
  Administered 2018-11-15 – 2018-11-17 (×3): 1 via ORAL
  Filled 2018-11-15 (×3): qty 1

## 2018-11-15 MED ORDER — SODIUM CHLORIDE 0.9% FLUSH
3.0000 mL | Freq: Two times a day (BID) | INTRAVENOUS | Status: DC
  Administered 2018-11-15 – 2018-11-16 (×4): 3 mL via INTRAVENOUS

## 2018-11-15 MED ORDER — METOPROLOL SUCCINATE ER 100 MG PO TB24
100.0000 mg | ORAL_TABLET | Freq: Every day | ORAL | Status: DC
  Administered 2018-11-15 – 2018-11-17 (×3): 100 mg via ORAL
  Filled 2018-11-15 (×3): qty 1

## 2018-11-15 MED ORDER — TAMSULOSIN HCL 0.4 MG PO CAPS
0.4000 mg | ORAL_CAPSULE | Freq: Every day | ORAL | Status: DC
  Administered 2018-11-15 – 2018-11-16 (×2): 0.4 mg via ORAL
  Filled 2018-11-15 (×2): qty 1

## 2018-11-15 MED ORDER — SODIUM CHLORIDE 0.9 % IV SOLN: 2.0000 g | INTRAVENOUS | Status: DC

## 2018-11-15 MED ORDER — CHROMIUM 400 MCG PO TABS: 1.0000 | ORAL_TABLET | Freq: Two times a day (BID) | ORAL | Status: DC

## 2018-11-15 MED ORDER — DABIGATRAN ETEXILATE MESYLATE 150 MG PO CAPS
150.0000 mg | ORAL_CAPSULE | Freq: Two times a day (BID) | ORAL | Status: DC
  Administered 2018-11-15 – 2018-11-17 (×4): 150 mg via ORAL
  Filled 2018-11-15 (×5): qty 1

## 2018-11-15 MED ORDER — MONTELUKAST SODIUM 10 MG PO TABS
10.0000 mg | ORAL_TABLET | Freq: Every day | ORAL | Status: DC
  Administered 2018-11-15 – 2018-11-16 (×2): 10 mg via ORAL
  Filled 2018-11-15 (×2): qty 1

## 2018-11-15 MED ORDER — ASPIRIN EC 81 MG PO TBEC
81.0000 mg | DELAYED_RELEASE_TABLET | Freq: Every day | ORAL | Status: DC
  Administered 2018-11-15 – 2018-11-17 (×3): 81 mg via ORAL
  Filled 2018-11-15 (×3): qty 1

## 2018-11-15 MED ORDER — ATORVASTATIN CALCIUM 20 MG PO TABS
20.0000 mg | ORAL_TABLET | Freq: Every day | ORAL | Status: DC
  Administered 2018-11-15 – 2018-11-17 (×3): 20 mg via ORAL
  Filled 2018-11-15 (×3): qty 1

## 2018-11-15 MED ORDER — SODIUM CHLORIDE 0.9 % IV SOLN
2.0000 g | INTRAVENOUS | Status: DC
  Administered 2018-11-15 – 2018-11-17 (×3): 2 g via INTRAVENOUS
  Filled 2018-11-15: qty 20
  Filled 2018-11-15: qty 2
  Filled 2018-11-15 (×2): qty 20

## 2018-11-15 MED ORDER — COLCHICINE 0.6 MG PO TABS: 0.6000 mg | ORAL_TABLET | ORAL | Status: DC | PRN

## 2018-11-15 MED ORDER — MOMETASONE FURO-FORMOTEROL FUM 200-5 MCG/ACT IN AERO
2.0000 | INHALATION_SPRAY | Freq: Two times a day (BID) | RESPIRATORY_TRACT | Status: DC
  Administered 2018-11-15 – 2018-11-17 (×5): 2 via RESPIRATORY_TRACT
  Filled 2018-11-15: qty 8.8

## 2018-11-15 MED ORDER — SODIUM CHLORIDE 0.9% FLUSH
3.0000 mL | Freq: Two times a day (BID) | INTRAVENOUS | Status: DC
  Administered 2018-11-15 – 2018-11-17 (×5): 3 mL via INTRAVENOUS

## 2018-11-15 MED ORDER — METHYLPREDNISOLONE SODIUM SUCC 125 MG IJ SOLR
125.0000 mg | INTRAMUSCULAR | Status: AC
  Administered 2018-11-15: 125 mg via INTRAVENOUS
  Filled 2018-11-15: qty 2

## 2018-11-15 MED ORDER — ENALAPRIL MALEATE 20 MG PO TABS
20.0000 mg | ORAL_TABLET | Freq: Two times a day (BID) | ORAL | Status: DC
  Administered 2018-11-15 – 2018-11-17 (×5): 20 mg via ORAL
  Filled 2018-11-15 (×6): qty 1

## 2018-11-15 MED ORDER — VITAMIN E 180 MG (400 UNIT) PO CAPS
400.0000 [IU] | ORAL_CAPSULE | Freq: Every day | ORAL | Status: DC
  Administered 2018-11-15 – 2018-11-17 (×3): 400 [IU] via ORAL
  Filled 2018-11-15 (×3): qty 1

## 2018-11-15 MED ORDER — POTASSIUM CHLORIDE CRYS ER 10 MEQ PO TBCR
10.0000 meq | EXTENDED_RELEASE_TABLET | Freq: Two times a day (BID) | ORAL | Status: DC
  Administered 2018-11-15 – 2018-11-17 (×5): 10 meq via ORAL
  Filled 2018-11-15 (×5): qty 1

## 2018-11-15 MED ORDER — ALBUTEROL SULFATE HFA 108 (90 BASE) MCG/ACT IN AERS: 2.0000 | INHALATION_SPRAY | Freq: Four times a day (QID) | RESPIRATORY_TRACT | Status: DC | PRN

## 2018-11-15 MED ORDER — VITAMIN C 500 MG PO TABS
500.0000 mg | ORAL_TABLET | Freq: Every day | ORAL | Status: DC
  Administered 2018-11-15 – 2018-11-17 (×3): 500 mg via ORAL
  Filled 2018-11-15 (×3): qty 1

## 2018-11-15 MED ORDER — SODIUM CHLORIDE 0.9 % IV SOLN: 500.0000 mg | INTRAVENOUS | Status: DC

## 2018-11-15 MED ORDER — ALBUTEROL SULFATE (2.5 MG/3ML) 0.083% IN NEBU: 2.5000 mg | INHALATION_SOLUTION | Freq: Four times a day (QID) | RESPIRATORY_TRACT | Status: DC | PRN

## 2018-11-15 MED ORDER — FUROSEMIDE 40 MG PO TABS
40.0000 mg | ORAL_TABLET | Freq: Every day | ORAL | Status: DC
  Administered 2018-11-15 – 2018-11-17 (×3): 40 mg via ORAL
  Filled 2018-11-15 (×3): qty 1

## 2018-11-15 MED ORDER — SODIUM CHLORIDE 0.9 % IV SOLN
500.0000 mg | INTRAVENOUS | Status: DC
  Administered 2018-11-15 – 2018-11-17 (×3): 500 mg via INTRAVENOUS
  Filled 2018-11-15 (×4): qty 500

## 2018-11-15 NOTE — ED Notes (Signed)
ED TO INPATIENT HANDOFF REPORT  ED Nurse Name and Phone #: Olive Bass, [place] Name/Age/Gender Leslie Prince 73 y.o. male Room/Bed: ED01A/ED01A  Code Status   Code Status: Full Code  Home/SNF/Other Home Patient oriented to: self, place, time and situation Is this baseline? Yes   Triage Complete: Triage complete  Chief Complaint Shortness of breath, Fever, chills  Triage Note Patient c/o SOB, chills, fever (temp 101) and body aches. Patient denies known exposure to COVID.   PT c/o body aches and worsening SOB xfew days. Hx of COPD, wears c-pap at night. PT also has noted cough and fever.    Allergies Allergies  Allergen Reactions  . Lisinopril Other (See Comments)    Severe urination    Level of Care/Admitting Diagnosis ED Disposition    ED Disposition Condition [facility]: [place] [[id number]]  Level of Care: Telemetry [5]  Covid Evaluation: Confirmed COVID Negative  Diagnosis: Sepsis [facility]) [[id number]]  Admitting Physician: Eula Flax  Attending Physician: Rufina Falco ACHIENG [[id number]]  Estimated length of stay: past midnight tomorrow  Certification:: I certify this patient will need inpatient services for at least 2 midnights  PT Class (Do Not Modify): Inpatient [101]  PT Acc Code (Do Not Modify): Private [1]       B Medical/Surgery History Past Medical History:  Diagnosis Date  . CHF (congestive heart failure) ([place])   . COPD (chronic obstructive pulmonary disease) ([place])   . Sleep apnea    Past Surgical History:  Procedure Laterality Date  . CARDIAC SURGERY    . COLONOSCOPY WITH PROPOFOL N/A 11/07/2017   Procedure: COLONOSCOPY WITH PROPOFOL;  Surgeon: Manya Silvas, MD;  Location: [facility] ENDOSCOPY;  Service: Endoscopy;  Laterality: N/A;  . CORONARY ANGIOPLASTY WITH STENT PLACEMENT       A IV Location/Drains/Wounds Patient Lines/Drains/Airways Status   Active Line/Drains/Airways     Name:   Placement date:   Placement time:   Site:   Days:   Peripheral IV 11/15/18 Right Antecubital   11/15/18    0756    Antecubital   less than 1   Peripheral IV 11/15/18 Right Forearm   11/15/18    0756    Forearm   less than 1          Intake/Output Last 24 hours No intake or output data in the 24 hours ending 11/15/18 1108  Labs/Imaging Results for orders placed or performed during the hospital encounter of 11/15/18 (from the past 48 hour(s))  Comprehensive metabolic panel     Status: Abnormal   Collection Time: 11/15/18  7:23 AM  Result Value Ref Range   Sodium 139 135 - 145 mmol/L   Potassium 3.2 (L) 3.5 - 5.1 mmol/L   Chloride 103 98 - 111 mmol/L   CO2 28 22 - 32 mmol/L   Glucose, Bld 112 (H) 70 - 99 mg/dL   BUN 13 8 - 23 mg/dL   Creatinine, Ser 0.75 0.61 - 1.24 mg/dL   Calcium 8.7 (L) 8.9 - 10.3 mg/dL   Total Protein 6.7 6.5 - 8.1 g/dL   Albumin 3.6 3.5 - 5.0 g/dL   AST 22 15 - 41 U/L   ALT 15 0 - 44 U/L   Alkaline Phosphatase 35 (L) 38 - 126 U/L   Total Bilirubin 2.1 (H) 0.3 - 1.2 mg/dL   GFR calc non Af Amer >60 >60 mL/min   GFR calc Af Amer >60 >60  mL/min   Anion gap 8 5 - 15    Comment: Performed at [facility], [address] [place], [place] [address]  Lipase, blood     Status: None   Collection Time: 11/15/18  7:23 AM  Result Value Ref Range   Lipase 21 11 - 51 U/L    Comment: Performed at [facility], [address] [place], [place] [address]  CBC WITH DIFFERENTIAL     Status: Abnormal   Collection Time: 11/15/18  7:23 AM  Result Value Ref Range   WBC 9.0 4.0 - 10.5 K/uL   RBC 3.55 (L) 4.22 - 5.81 MIL/uL   Hemoglobin 11.1 (L) 13.0 - 17.0 g/dL   HCT 33.7 (L) 39.0 - 52.0 %   MCV 94.9 80.0 - 100.0 fL   MCH 31.3 26.0 - 34.0 pg   MCHC 32.9 30.0 - 36.0 g/dL   RDW 13.3 11.5 - 15.5 %   Platelets 105 (L) 150 - 400 K/uL    Comment: Immature Platelet Fraction may be clinically indicated, consider ordering this additional  test [id number]    nRBC 0.0 0.0 - 0.2 %   Neutrophils Relative % 81 %   Neutro Abs 7.3 1.7 - 7.7 K/uL   Lymphocytes Relative 7 %   Lymphs Abs 0.6 (L) 0.7 - 4.0 K/uL   Monocytes Relative 9 %   Monocytes Absolute 0.8 0.1 - 1.0 K/uL   Eosinophils Relative 2 %   Eosinophils Absolute 0.1 0.0 - 0.5 K/uL   Basophils Relative 0 %   Basophils Absolute 0.0 0.0 - 0.1 K/uL   Immature Granulocytes 1 %   Abs Immature Granulocytes 0.07 0.00 - 0.07 K/uL    Comment: Performed at [facility], [address] [place], [place] [address]  Procalcitonin     Status: None   Collection Time: 11/15/18  7:23 AM  Result Value Ref Range   Procalcitonin <0.10 ng/mL    Comment:        Interpretation: PCT (Procalcitonin) <= 0.5 ng/mL: Systemic infection (sepsis) is not likely. Local bacterial infection is possible. (NOTE)       Sepsis PCT Algorithm           Lower Respiratory Tract                                      Infection PCT Algorithm    ----------------------------     ----------------------------         PCT < 0.25 ng/mL                PCT < 0.10 ng/mL         Strongly encourage             Strongly discourage   discontinuation of antibiotics    initiation of antibiotics    ----------------------------     -----------------------------       PCT 0.25 - 0.50 ng/mL            PCT 0.10 - 0.25 ng/mL               OR       >80% decrease in PCT            Discourage initiation of  antibiotics      Encourage discontinuation           of antibiotics    ----------------------------     -----------------------------         PCT >= 0.50 ng/mL              PCT 0.26 - 0.50 ng/mL               AND        <80% decrease in PCT             Encourage initiation of                                             antibiotics       Encourage continuation           of antibiotics    ----------------------------     -----------------------------        PCT >= 0.50  ng/mL                  PCT > 0.50 ng/mL               AND         increase in PCT                  Strongly encourage                                      initiation of antibiotics    Strongly encourage escalation           of antibiotics                                     -----------------------------                                           PCT <= 0.25 ng/mL                                                 OR                                        > 80% decrease in PCT                                     Discontinue / Do not initiate                                             antibiotics Performed at [facility], [address] [place], [place] [address]   APTT     Status: Abnormal   Collection Time: 11/15/18  7:23  AM  Result Value Ref Range   aPTT 59 (H) 24 - 36 seconds    Comment:        IF BASELINE aPTT IS ELEVATED, SUGGEST PATIENT RISK ASSESSMENT BE USED TO DETERMINE APPROPRIATE ANTICOAGULANT THERAPY. Performed at [facility], [address] [place], [place] [address]   Protime-INR     Status: Abnormal   Collection Time: 11/15/18  7:23 AM  Result Value Ref Range   Prothrombin Time 19.4 (H) 11.4 - 15.2 seconds   INR 1.7 (H) 0.8 - 1.2    Comment: (NOTE) INR goal varies based on device and disease states. Performed at [facility], [address] [place], [place] [address]   Lactic acid, plasma     Status: None   Collection Time: 11/15/18  7:31 AM  Result Value Ref Range   Lactic Acid, Venous 0.9 0.5 - 1.9 mmol/L    Comment: Performed at [facility], [address] [place],  [address]  SARS Coronavirus 2 [facility] order, Performed in [facility] hospital lab) Nasopharyngeal Nasopharyngeal Swab     Status: None   Collection Time: 11/15/18  7:31 AM   Specimen: Nasopharyngeal Swab  Result Value Ref Range   SARS Coronavirus 2 NEGATIVE NEGATIVE    Comment: (NOTE) If result is NEGATIVE SARS-CoV-2 target nucleic acids  are NOT DETECTED. The SARS-CoV-2 RNA is generally detectable in upper and lower  respiratory specimens during the acute phase of infection. The lowest  concentration of SARS-CoV-2 viral copies this assay can detect is 250  copies / mL. A negative result does not preclude SARS-CoV-2 infection  and should not be used as the sole basis for treatment or other  patient management decisions.  A negative result may occur with  improper specimen collection / handling, submission of specimen other  than nasopharyngeal swab, presence of viral mutation(s) within the  areas targeted by this assay, and inadequate number of viral copies  (<250 copies / mL). A negative result must be combined with clinical  observations, patient history, and epidemiological information. If result is POSITIVE SARS-CoV-2 target nucleic acids are DETECTED. The SARS-CoV-2 RNA is generally detectable in upper and lower  respiratory specimens dur ing the acute phase of infection.  Positive  results are indicative of active infection with SARS-CoV-2.  Clinical  correlation with patient history and other diagnostic information is  necessary to determine patient infection status.  Positive results do  not rule out bacterial infection or co-infection with other viruses. If result is PRESUMPTIVE POSTIVE SARS-CoV-2 nucleic acids MAY BE PRESENT.   A presumptive positive result was obtained on the submitted specimen  and confirmed on repeat testing.  While 2019 novel coronavirus  (SARS-CoV-2) nucleic acids may be present in the submitted sample  additional confirmatory testing may be necessary for epidemiological  and / or clinical management purposes  to differentiate between  SARS-CoV-2 and other Sarbecovirus currently known to infect humans.  If clinically indicated additional testing with an alternate test  methodology [phone]) is advised. The SARS-CoV-2 RNA is generally  detectable in upper and lower respiratory sp ecimens  during the acute  phase of infection. The expected result is Negative. Fact Sheet for Patients:  StrictlyIdeas.no Fact Sheet for Healthcare Providers: BankingDealers.co.za This test is not yet approved or cleared by the [place] FDA and has been authorized for detection and/or diagnosis of SARS-CoV-2 by FDA under an Emergency Use Authorization (EUA).  This EUA will remain in effect (meaning this test  can be used) for the duration of the COVID-19 declaration under Section 564(b)(1) of the Act, 21 U.S.C. section 360bbb-3(b)(1), unless the authorization is terminated or revoked sooner. Performed at [facility], [address] [place], [place] [address]   Urinalysis, Complete w Microscopic     Status: Abnormal   Collection Time: 11/15/18  9:16 AM  Result Value Ref Range   Color, Urine YELLOW (A) YELLOW   APPearance CLEAR (A) CLEAR   Specific Gravity, Urine 1.020 1.005 - 1.030   pH 5.0 5.0 - 8.0   Glucose, UA NEGATIVE NEGATIVE mg/dL   Hgb urine dipstick SMALL (A) NEGATIVE   Bilirubin Urine NEGATIVE NEGATIVE   Ketones, ur 5 (A) NEGATIVE mg/dL   Protein, ur NEGATIVE NEGATIVE mg/dL   Nitrite NEGATIVE NEGATIVE   Leukocytes,Ua NEGATIVE NEGATIVE   RBC / HPF 21-50 0 - 5 RBC/hpf   WBC, UA 0-5 0 - 5 WBC/hpf   Bacteria, UA NONE SEEN NONE SEEN   Squamous Epithelial / LPF 0-5 0 - 5   Mucus PRESENT     Comment: Performed at [facility], [address] [place], [place] [address]   Dg Chest Port 1 View  Result Date: 11/15/2018 CLINICAL DATA:  Shortness of breath EXAM: PORTABLE CHEST 1 VIEW COMPARISON:  CT angiogram chest October 03, 2018 and chest radiograph May 22, 2017 FINDINGS: The nodular opacity in the right base seen on CT is not appreciable by radiography. There is interstitial thickening in the right base with subtle ill-defined opacity, concerning for early pneumonia in the right base. Lungs elsewhere clear. Heart  is enlarged with pulmonary venous hypertension evident. Patient is status post tricuspid and mitral valve replacements. There is aortic atherosclerosis. No adenopathy. No bone lesions. IMPRESSION: Question earliest changes of pneumonia in the right base. Note that the nodular opacity seen on prior CT in the right base is not appreciable by radiography. Recommendations for surveillance of this nodular opacity made and prior CT report remain in effect. There is pulmonary vascular congestion. Status post tricuspid and mitral valve replacements. Aortic Atherosclerosis (ICD10-I70.0). Electronically Signed   By: Lowella Grip III M.D.   On: 11/15/2018 08:15    Pending Labs Unresulted Labs (From admission, onward)    Start     Ordered   11/16/18 7858  Basic metabolic panel  Tomorrow morning,   STAT     11/15/18 1107   11/16/18 0500  CBC  Tomorrow morning,   STAT     11/15/18 1107   11/15/18 1106  Brain natriuretic peptide  Add-on,   AD     11/15/18 1107   11/15/18 0723  Blood Culture (routine x 2)  BLOOD CULTURE X 2,   STAT     11/15/18 0722   11/15/18 0723  Urine culture  ONCE - STAT,   STAT     11/15/18 0722          Vitals/Pain Today's Vitals   11/15/18 0900 11/15/18 0930 11/15/18 1030 11/15/18 1100  BP: 133/62 138/63 139/77 (!) 145/75  Pulse: 91 86 86 90  Resp: (!) 31 (!) 28 (!) 28 16  Temp:      TempSrc:      SpO2: 94% 94% 95% 95%  Weight:      Height:      PainSc:        Isolation Precautions Airborne and Contact precautions  Medications Medications  cefTRIAXone (ROCEPHIN) 2 g in sodium chloride 0.9 % 100 mL IVPB (0 g  Intravenous Stopped 11/15/18 0936)  azithromycin (ZITHROMAX) 500 mg in sodium chloride 0.9 % 250 mL IVPB (0 mg Intravenous Stopped 11/15/18 0906)  aspirin EC tablet 81 mg (has no administration in time range)  colchicine tablet 0.6 mg (has no administration in time range)  atorvastatin (LIPITOR) tablet 20 mg (has no administration in time range)  enalapril  (VASOTEC) tablet 20 mg (has no administration in time range)  furosemide (LASIX) tablet 40 mg (has no administration in time range)  metoprolol succinate (TOPROL-XL) 24 hr tablet 100 mg (has no administration in time range)  tamsulosin (FLOMAX) capsule 0.4 mg (has no administration in time range)  dabigatran (PRADAXA) capsule 150 mg (has no administration in time range)  Chromium TABS 1 tablet (has no administration in time range)  potassium chloride SA (K-DUR) CR tablet 10 mEq (has no administration in time range)  Multiple Vitamins 1 tablet (has no administration in time range)  vitamin C (ASCORBIC ACID) tablet 500 mg (has no administration in time range)  vitamin E capsule 400 Units (has no administration in time range)  albuterol (PROVENTIL) (2.5 MG/3ML) 0.083% nebulizer solution 2.5 mg (has no administration in time range)  albuterol (VENTOLIN HFA) 108 (90 Base) MCG/ACT inhaler 2 puff (has no administration in time range)  mometasone-formoterol (DULERA) 200-5 MCG/ACT inhaler 2 puff (has no administration in time range)  montelukast (SINGULAIR) tablet 10 mg (has no administration in time range)  tiotropium (SPIRIVA) inhalation capsule ([facility] use ONLY) 18 mcg (has no administration in time range)  methylPREDNISolone sodium succinate (SOLU-MEDROL) 125 mg/2 mL injection 125 mg (125 mg Intravenous Given 11/15/18 0801)    Mobility walks Low fall risk   Focused Assessments Pulmonary Assessment Handoff:  Lung sounds:   O2 Device: Nasal Cannula O2 Flow Rate (L/min): 2 L/min      R Recommendations: See Admitting Provider Note  Report given to:   Additional Notes:

## 2018-11-15 NOTE — ED Provider Notes (Signed)
[facility] Emergency Department Provider Note  ____________________________________________  Time seen: Approximately 7:51 AM  I have reviewed the triage vital signs and the nursing notes.   HISTORY  Chief Complaint Shortness of Breath    HPI Leslie Prince is a 73 y.o. male with a history of CHF COPD atrial fibrillation who comes the ED complaining of body aches chills shortness of breath and nonproductive cough for the past 2 days, gradual onset, worsening.  Today he woke up with a fever as well.  Symptoms are constant, waxing and waning, no aggravating or alleviating factors.  No sick contacts or known exposure to COVID, but he does note that he has been eating out at restaurants lately.      Past Medical History:  Diagnosis Date  . CHF (congestive heart failure) ([place])   . COPD (chronic obstructive pulmonary disease) ([place])   . Sleep apnea      Patient Active Problem List   Diagnosis Date Noted  . Sepsis ([place]) 11/15/2018  . 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 Prostate nodule 03/15/2018  . Hx of adenomatous colonic polyps 08/23/2017  . Atrial fibrillation with RVR ([place]) 05/22/2017  . Congestive heart failure ([place]) 09/21/2015  . Allergy to environmental factors 09/21/2015  . HLD (hyperlipidemia) 09/21/2015  . Disorder of mitral valve 09/21/2015  . Adiposity 09/21/2015  . Chronic diastolic heart failure ([place]) 01/20/2015  . Atrial fibrillation ([place]) 01/19/2014  . Apnea, sleep 01/19/2014  . Arteriosclerosis of coronary artery 01/19/2014  . BP (high blood pressure) 12/24/2013  . Hepatitis A 02/07/2012  . Chronic obstructive pulmonary disease ([place]) 02/03/2011  . Breath shortness 02/03/2011  . Snores 02/03/2011     Past Surgical History:  Procedure Laterality Date  . CARDIAC SURGERY    . COLONOSCOPY WITH PROPOFOL N/A 11/07/2017   Procedure: COLONOSCOPY WITH PROPOFOL;  Surgeon: Manya Silvas, MD;  Location: [facility] ENDOSCOPY;   Service: Endoscopy;  Laterality: N/A;  . CORONARY ANGIOPLASTY WITH STENT PLACEMENT       Prior to Admission medications   Medication Sig Start Date End Date Taking? Authorizing Provider  albuterol (PROAIR HFA) 108 (90 Base) MCG/ACT inhaler Inhale 2 puffs into the lungs every 6 (six) hours as needed for wheezing.  07/28/14  Yes [provider]  albuterol (PROVENTIL) (2.5 MG/3ML) 0.083% nebulizer solution Take 2.5 mg by nebulization every 6 (six) hours as needed for wheezing.   Yes [provider]  aspirin EC 81 MG tablet Take 81 mg by mouth daily.    Yes [provider]  atorvastatin (LIPITOR) 20 MG tablet Take 20 mg by mouth daily. 09/03/15  Yes [provider]  Chromium 400 MCG TABS Take 1 tablet by mouth 2 (two) times daily.   Yes [provider]  colchicine 0.6 MG tablet Take 0.6 mg by mouth as directed. Take 2 tablets at onset of gout flare, then 1 tablet one hour later   Yes [provider]  dabigatran (PRADAXA) 150 MG CAPS capsule Take 150 mg by mouth 2 (two) times daily.  05/11/15  Yes [provider]  enalapril (VASOTEC) 20 MG tablet Take 20 mg by mouth 2 (two) times daily.  09/07/15  Yes [provider]  Fluticasone-Salmeterol (ADVAIR DISKUS) 250-50 MCG/DOSE AEPB Inhale 1 puff into the lungs 2 (two) times daily.  01/26/15  Yes [provider]  furosemide (LASIX) 40 MG tablet Take 40 mg by mouth daily.   Yes [provider]  KLOR-CON M10 10  MEQ tablet Take 10 mEq by mouth 2 (two) times daily.  09/03/15  Yes [provider]  metoprolol succinate (TOPROL-XL) 100 MG 24 hr tablet Take 100 mg by mouth daily. 03/18/18  Yes [provider]  montelukast (SINGULAIR) 10 MG tablet TAKE 1 TABLET (10 MG TOTAL) BY MOUTH NIGHTLY. 08/06/15  Yes [provider]  Multiple Vitamins tablet Take 1 tablet by mouth daily.    Yes [provider]  tamsulosin (FLOMAX) 0.4 MG CAPS capsule Take 1  capsule (0.4 mg total) by mouth daily after supper. 06/12/18  Yes Noreene Filbert, MD  tiotropium (SPIRIVA) 18 MCG inhalation capsule Place 18 mcg into inhaler and inhale daily.   Yes [provider]  vitamin C (ASCORBIC ACID) 500 MG tablet Take 500 mg by mouth daily.    Yes [provider]  vitamin E 400 UNIT capsule Take 400 Units by mouth daily.    Yes [provider]     Allergies Lisinopril   No family history on file.  Social History Social History   Tobacco Use  . Smoking status: Former Smoker    Packs/day: 1.00    Years: 25.00    Pack years: 25.00    Types: Cigarettes    Quit date: 06/21/1995    Years since quitting: 23.4  . Smokeless tobacco: Former Network engineer Use Topics  . Alcohol use: Yes  . Drug use: Never    Review of Systems  Constitutional: Positive fever and chills.  ENT:   No sore throat. No rhinorrhea. Cardiovascular:   No chest pain or syncope. Respiratory:   Positive shortness of breath and nonproductive cough. Gastrointestinal:   Negative for abdominal pain, vomiting and diarrhea.  Musculoskeletal:   Negative for focal pain or swelling.  Positive body aches All other systems reviewed and are negative except as documented above in ROS and HPI.  ____________________________________________   PHYSICAL EXAM:  VITAL SIGNS: ED Triage Vitals  Enc Vitals Group     BP 11/15/18 0712 (!) 150/84     Pulse Rate 11/15/18 0712 100     Resp 11/15/18 0712 (!) 24     Temp 11/15/18 0712 (!) 103.2 F (39.6 C)     Temp Source 11/15/18 0712 Oral     SpO2 11/15/18 0712 94 %     Weight 11/15/18 0710 285 lb (129.3 kg)     Height 11/15/18 0710 5\' 9"  (1.753 m)     Head Circumference --      Peak Flow --      Pain Score 11/15/18 0710 5     Pain Loc --      Pain Edu? --      Excl. in [place]? --     Vital signs reviewed, nursing assessments reviewed.   Constitutional:   Alert and oriented. Non-toxic appearance.  Morbidly  obese. Eyes:   Conjunctivae are normal. EOMI. PERRL. ENT      Head:   Normocephalic and atraumatic.      Nose:   No congestion/rhinnorhea.       Mouth/Throat:   MMM, no pharyngeal erythema. No peritonsillar mass.       Neck:   No meningismus. Full ROM. Hematological/Lymphatic/Immunilogical:   No cervical lymphadenopathy. Cardiovascular:   RRR. Symmetric bilateral radial and DP pulses.  No murmurs. Cap refill less than 2 seconds. Respiratory:   Tachypnea, normal work of breathing.  Bibasilar inspiratory crackles.  Scant expiratory wheezing.  Good air entry bilaterally. Gastrointestinal:   Soft  and nontender. Non distended. There is no CVA tenderness.  No rebound, rigidity, or guarding.  Musculoskeletal:   Normal range of motion in all extremities. No joint effusions.  No lower extremity tenderness.  No edema. Neurologic:   Normal speech and language.  Motor grossly intact. No acute focal neurologic deficits are appreciated.  Skin:    Skin is warm, dry and intact. No rash noted.  No petechiae, purpura, or bullae.  ____________________________________________    LABS (pertinent positives/negatives) (all labs ordered are listed, but only abnormal results are displayed) Labs Reviewed  COMPREHENSIVE METABOLIC PANEL - Abnormal; Notable for the following components:      Result Value   Potassium 3.2 (*)    Glucose, Bld 112 (*)    Calcium 8.7 (*)    Alkaline Phosphatase 35 (*)    Total Bilirubin 2.1 (*)    All other components within normal limits  CBC WITH DIFFERENTIAL/PLATELET - Abnormal; Notable for the following components:   RBC 3.55 (*)    Hemoglobin 11.1 (*)    HCT 33.7 (*)    Platelets 105 (*)    Lymphs Abs 0.6 (*)    All other components within normal limits  APTT - Abnormal; Notable for the following components:   aPTT 59 (*)    All other components within normal limits  PROTIME-INR - Abnormal; Notable for the following components:   Prothrombin Time 19.4 (*)    INR 1.7  (*)    All other components within normal limits  URINALYSIS, COMPLETE (UACMP) WITH MICROSCOPIC - Abnormal; Notable for the following components:   Color, Urine YELLOW (*)    APPearance CLEAR (*)    Hgb urine dipstick SMALL (*)    Ketones, ur 5 (*)    All other components within normal limits  SARS CORONAVIRUS 2 (HOSPITAL ORDER, [facility])  CULTURE, BLOOD (ROUTINE X 2)  CULTURE, BLOOD (ROUTINE X 2)  URINE CULTURE  LACTIC ACID, PLASMA  LIPASE, BLOOD  PROCALCITONIN  BRAIN NATRIURETIC PEPTIDE   ____________________________________________   EKG  Interpreted by me Atrial fibrillation, rate 103, normal axis, slightly prolonged QTC of 508 ms.  Normal QRS ST segments and T waves.  ____________________________________________    RADIOLOGY  Dg Chest Port 1 View  Result Date: 11/15/2018 CLINICAL DATA:  Shortness of breath EXAM: PORTABLE CHEST 1 VIEW COMPARISON:  CT angiogram chest October 03, 2018 and chest radiograph May 22, 2017 FINDINGS: The nodular opacity in the right base seen on CT is not appreciable by radiography. There is interstitial thickening in the right base with subtle ill-defined opacity, concerning for early pneumonia in the right base. Lungs elsewhere clear. Heart is enlarged with pulmonary venous hypertension evident. Patient is status post tricuspid and mitral valve replacements. There is aortic atherosclerosis. No adenopathy. No bone lesions. IMPRESSION: Question earliest changes of pneumonia in the right base. Note that the nodular opacity seen on prior CT in the right base is not appreciable by radiography. Recommendations for surveillance of this nodular opacity made and prior CT report remain in effect. There is pulmonary vascular congestion. Status post tricuspid and mitral valve replacements. Aortic Atherosclerosis (ICD10-I70.0). Electronically Signed   By: Lowella Grip III M.D.   On: 11/15/2018 08:15     ____________________________________________   PROCEDURES .Critical Care Performed by: Carrie Mew, MD Authorized by: Carrie Mew, MD   Critical care provider statement:    Critical care time (minutes):  35   Critical care time was exclusive of:  Separately billable procedures and treating other patients   Critical care was necessary to treat or prevent imminent or life-threatening deterioration of the following conditions:  Sepsis   Critical care was time spent personally by me on the following activities:  Development of treatment plan with patient or surrogate, discussions with consultants, evaluation of patient's response to treatment, examination of patient, obtaining history from patient or surrogate, ordering and performing treatments and interventions, ordering and review of laboratory studies, ordering and review of radiographic studies, pulse oximetry, re-evaluation of patient's condition and review of old charts    ____________________________________________  DIFFERENTIAL DIAGNOSIS   Pneumonia with sepsis, COVID-19 pneumonitis, CHF, pleural effusion, pulmonary edema.  Doubt ACS PE dissection carditis or pneumothorax.  CLINICAL IMPRESSION / ASSESSMENT AND PLAN / ED COURSE  Medications ordered in the ED: Medications  cefTRIAXone (ROCEPHIN) 2 g in sodium chloride 0.9 % 100 mL IVPB (0 g Intravenous Stopped 11/15/18 0936)  azithromycin (ZITHROMAX) 500 mg in sodium chloride 0.9 % 250 mL IVPB (0 mg Intravenous Stopped 11/15/18 0906)  aspirin EC tablet 81 mg (has no administration in time range)  colchicine tablet 0.6 mg (has no administration in time range)  atorvastatin (LIPITOR) tablet 20 mg (has no administration in time range)  enalapril (VASOTEC) tablet 20 mg (has no administration in time range)  furosemide (LASIX) tablet 40 mg (has no administration in time range)  metoprolol succinate (TOPROL-XL) 24 hr tablet 100 mg (has no administration in time range)   tamsulosin (FLOMAX) capsule 0.4 mg (has no administration in time range)  dabigatran (PRADAXA) capsule 150 mg (has no administration in time range)  Chromium TABS 1 tablet (has no administration in time range)  potassium chloride SA (K-DUR) CR tablet 10 mEq (has no administration in time range)  multivitamin with minerals tablet 1 tablet (has no administration in time range)  vitamin C (ASCORBIC ACID) tablet 500 mg (has no administration in time range)  vitamin E capsule 400 Units (has no administration in time range)  albuterol (PROVENTIL) (2.5 MG/3ML) 0.083% nebulizer solution 2.5 mg (has no administration in time range)  albuterol (VENTOLIN HFA) 108 (90 Base) MCG/ACT inhaler 2 puff (has no administration in time range)  mometasone-formoterol (DULERA) 200-5 MCG/ACT inhaler 2 puff (has no administration in time range)  montelukast (SINGULAIR) tablet 10 mg (has no administration in time range)  tiotropium (SPIRIVA) inhalation capsule ([facility] use ONLY) 18 mcg (has no administration in time range)  methylPREDNISolone sodium succinate (SOLU-MEDROL) 125 mg/2 mL injection 125 mg (125 mg Intravenous Given 11/15/18 0801)    Pertinent labs & imaging results that were available during my care of the patient were reviewed by me and considered in my medical decision making (see chart for details).  WYNDELL CARDIFF was evaluated in Emergency Department on 11/15/2018 for the symptoms described in the history of present illness. He was evaluated in the context of the global COVID-19 pandemic, which necessitated consideration that the patient might be at risk for infection with the SARS-CoV-2 virus that causes COVID-19. Institutional protocols and algorithms that pertain to the evaluation of patients at risk for COVID-19 are in a state of rapid change based on information released by regulatory bodies including the CDC and federal and state organizations. These policies and algorithms were followed during the  patient's care in the ED.   Patient presents with fever tachypnea and constitutional symptoms most suggestive of viral illness and possibly COVID-19.  With his medical history, sepsis protocol initiated upon initial assessment.  Empiric  antibiotics with ceftriaxone and azithromycin.  IV Solu-Medrol 125 mg.  Will check chest x-ray and lab panel.  No signs of shock initially, appears euvolemic, will withhold IV fluids at this time.   ----------------------------------------- 11:16 AM on 11/15/2018 -----------------------------------------  Work-up reassuring although there is a right lower lobe infiltrate.  Cover test is negative although he has symptomatology suggestive of COVID so we will keep him on precautions.  Discussed with hospitalist for further management.  Sepsis reassessment has been completed.  Initial lactate is normal, no hypotension, no repeat lactate needed.     ____________________________________________   FINAL CLINICAL IMPRESSION(S) / ED DIAGNOSES    Final diagnoses:  Pneumonia of right lower lobe due to infectious organism ([place])  Sepsis, due to unspecified organism, unspecified whether acute organ dysfunction present [facility])     ED Discharge Orders    None      Portions of this note were generated with dragon dictation software. Dictation errors may occur despite best attempts at proofreading.   Carrie Mew, MD 11/15/18 1116

## 2018-11-15 NOTE — ED Notes (Signed)
Assisted to use the urinal. UA and Culture collected at this time. Pt tolerated well. Will continue to monitor.

## 2018-11-15 NOTE — ED Notes (Signed)
Admitting MD at the bedside.  

## 2018-11-15 NOTE — ED Triage Notes (Signed)
Patient c/o SOB, chills, fever (temp 101) and body aches. Patient denies known exposure to COVID.

## 2018-11-15 NOTE — Progress Notes (Signed)
PHARMACIST - PHYSICIAN ORDER COMMUNICATION  CONCERNING: P&T Medication Policy on Herbal Medications  DESCRIPTION:  This patient's order for:  Chromium capsules  has been noted.  This product(s) is classified as an "herbal" or natural product. Due to a lack of definitive safety studies or FDA approval, nonstandard manufacturing practices, plus the potential risk of unknown drug-drug interactions while on inpatient medications, the Pharmacy and Therapeutics Committee does not permit the use of "herbal" or natural products of this type within [facility].   ACTION TAKEN: The pharmacy department is unable to verify this order at this time and your patient has been informed of this safety policy. Please reevaluate patient's clinical condition at discharge and address if the herbal or natural product(s) should be resumed at that time.  Pearla Dubonnet, PharmD Clinical Pharmacist 11/15/2018 1:30 PM

## 2018-11-15 NOTE — ED Notes (Signed)
Spoke with receiving RN. States not available to ask questions. Notified per policy would be bringing pt up. This RN/Megan, RN phone number listed in care hand off.

## 2018-11-15 NOTE — ED Triage Notes (Signed)
PT c/o body aches and worsening SOB xfew days. Hx of COPD, wears c-pap at night. PT also has noted cough and fever.

## 2018-11-15 NOTE — ED Notes (Signed)
Attempted to call report x 1  

## 2018-11-15 NOTE — H&P (Signed)
[place] [facility] [place] NAME: Leslie Prince    MR#:  [id number]  DATE OF BIRTH:  11/06/1945  DATE OF ADMISSION:  11/15/2018  PRIMARY CARE PHYSICIAN: [name], MD   REQUESTING/REFERRING PHYSICIAN: Brenton Grills, MD  CHIEF COMPLAINT:   Chief Complaint  Patient presents with  . Shortness of Breath    HISTORY OF PRESENT ILLNESS:   73 year old male with past medical history of COPD, OSA on CPAP at night, chronic diastolic CHF, hypertension, CAD, atrial fibrillation on Pradaxa, hepatitis A, mitral valve repair and hyperlipidemia presenting to the ED with complaints of shortness of breath, myalgia, fevers and chills.  Patient describes onset of symptoms since yesterday with progressive worsening.  Per patient's wife who is currently at the bedside, she noticed that patient was unusually tired and sleepy for the most part since Tuesday.  She thought it may be due to fatigue from moving to a new residence.  She stated patient was complaining of fevers and needing a date channel covers at night which is unusual for him.  Denies associated symptoms of chest pain, diaphoresis, nausea vomiting, diarrhea or other GI or respiratory symptoms.  On arrival to the ED, he was aebrile temperature of 103.2 with blood pressure 150/84 mm Hg and pulse rate 165 beats/min, respiration 31. There were no focal neurological deficits; he was alert and oriented x4, and he did not demonstrate any memory deficits.  Initial labs revealed lactic levels of 0.9, WBC 9.0, potassium 3.2, bili 2.1, lipase 21, platelets 105, procalcitonin<0.1, COVID-19 negative.  UA did not show evidence of UTI.  Chest x-ray shows questionable pneumonia in the right base and pulmonary vascular congestion.  Code sepsis was initiated patient started on ceftriaxone and azithromycin.  He will be admitted under hospitalist service for further management  PAST MEDICAL HISTORY:   Past Medical History:   Diagnosis Date  . CHF (congestive heart failure) ([place])   . COPD (chronic obstructive pulmonary disease) ([place])   . Sleep apnea    PAST SURGICAL HISTORY:   Past Surgical History:  Procedure Laterality Date  . CARDIAC SURGERY    . COLONOSCOPY WITH PROPOFOL N/A 11/07/2017   Procedure: COLONOSCOPY WITH PROPOFOL;  Surgeon: Manya Silvas, MD;  Location: [facility] ENDOSCOPY;  Service: Endoscopy;  Laterality: N/A;  . CORONARY ANGIOPLASTY WITH STENT PLACEMENT      SOCIAL HISTORY:   Social History   Tobacco Use  . Smoking status: Former Smoker    Packs/day: 1.00    Years: 25.00    Pack years: 25.00    Types: Cigarettes    Quit date: 06/21/1995    Years since quitting: 23.4  . Smokeless tobacco: Former Network engineer Use Topics  . Alcohol use: Yes    FAMILY HISTORY:  History reviewed. No pertinent family history.  DRUG ALLERGIES:   Allergies  Allergen Reactions  . Lisinopril Other (See Comments)    Severe urination    REVIEW OF SYSTEMS:   Review of Systems  Constitutional: Positive for chills, fever and malaise/fatigue. Negative for weight loss.  HENT: Negative for congestion, hearing loss and sore throat.   Eyes: Negative for blurred vision and double vision.  Respiratory: Positive for shortness of breath. Negative for cough and wheezing.   Cardiovascular: Positive for leg swelling. Negative for chest pain, palpitations and orthopnea.  Gastrointestinal: Negative for abdominal pain, diarrhea, nausea and vomiting.  Genitourinary: Negative for dysuria and urgency.  Musculoskeletal: Negative for myalgias.  Skin:  Negative for rash.  Neurological: Negative for dizziness, sensory change, speech change, focal weakness and headaches.  Psychiatric/Behavioral: Negative for depression.   MEDICATIONS AT HOME:   Prior to Admission medications   Medication Sig Start Date End Date Taking? Authorizing Provider  albuterol (PROAIR HFA) 108 (90 Base) MCG/ACT inhaler Inhale 2 puffs into  the lungs every 6 (six) hours as needed for wheezing.  07/28/14  Yes [provider]  albuterol (PROVENTIL) (2.5 MG/3ML) 0.083% nebulizer solution Take 2.5 mg by nebulization every 6 (six) hours as needed for wheezing.   Yes [provider]  aspirin EC 81 MG tablet Take 81 mg by mouth daily.    Yes [provider]  atorvastatin (LIPITOR) 20 MG tablet Take 20 mg by mouth daily. 09/03/15  Yes [provider]  Chromium 400 MCG TABS Take 1 tablet by mouth 2 (two) times daily.   Yes [provider]  colchicine 0.6 MG tablet Take 0.6 mg by mouth as directed. Take 2 tablets at onset of gout flare, then 1 tablet one hour later   Yes [provider]  dabigatran (PRADAXA) 150 MG CAPS capsule Take 150 mg by mouth 2 (two) times daily.  05/11/15  Yes [provider]  enalapril (VASOTEC) 20 MG tablet Take 20 mg by mouth 2 (two) times daily.  09/07/15  Yes [provider]  Fluticasone-Salmeterol (ADVAIR DISKUS) 250-50 MCG/DOSE AEPB Inhale 1 puff into the lungs 2 (two) times daily.  01/26/15  Yes [provider]  furosemide (LASIX) 40 MG tablet Take 40 mg by mouth daily.   Yes [provider]  KLOR-CON M10 10 MEQ tablet Take 10 mEq by mouth 2 (two) times daily.  09/03/15  Yes [provider]  metoprolol succinate (TOPROL-XL) 100 MG 24 hr tablet Take 100 mg by mouth daily. 03/18/18  Yes [provider]  montelukast (SINGULAIR) 10 MG tablet TAKE 1 TABLET (10 MG TOTAL) BY MOUTH NIGHTLY. 08/06/15  Yes [provider]  Multiple Vitamins tablet Take 1 tablet by mouth daily.    Yes [provider]  tamsulosin (FLOMAX) 0.4 MG CAPS capsule Take 1 capsule (0.4 mg total) by mouth daily after supper. 06/12/18  Yes Noreene Filbert, MD  tiotropium (SPIRIVA) 18 MCG inhalation capsule Place 18 mcg into inhaler and inhale daily.   Yes [provider]  vitamin C (ASCORBIC ACID) 500 MG tablet Take 500 mg by  mouth daily.    Yes [provider]  vitamin E 400 UNIT capsule Take 400 Units by mouth daily.    Yes [provider]      VITAL SIGNS:  Blood pressure (!) 152/83, pulse 84, temperature 97.7 F (36.5 C), temperature source Oral, resp. rate 20, height 5\' 9"  (1.753 m), weight 129.3 kg, SpO2 95 %.  PHYSICAL EXAMINATION:   Physical Exam  GENERAL:  73 y.o.-year-old patient lying in the bed with no acute distress.  EYES: Pupils equal, round, reactive to light and accommodation. No scleral icterus. Extraocular muscles intact.  HEENT: Head atraumatic, normocephalic. Oropharynx and nasopharynx clear.  NECK:  Supple, no jugular venous distention. No thyroid enlargement, no tenderness.  LUNGS: Decreased breath sounds bilaterally, no wheezing, rales,rhonchi or crepitation. No use of accessory muscles of respiration.  CARDIOVASCULAR: S1, S2 normal. No murmurs, rubs, or gallops.  ABDOMEN: Soft, nontender, nondistended. Bowel sounds present. No organomegaly or mass.  EXTREMITIES:Bilateral pedal edema, cyanosis, or clubbing. No rash or lesions. + pedal pulses MUSCULOSKELETAL: Normal bulk, and power was 5+  grip and elbow, knee, and ankle flexion and extension bilaterally.  NEUROLOGIC:Alert and oriented x 3. CN 2-12 intact. Sensation to light touch and cold stimuli intact bilaterally. Babinski is downgoing. DTR's (biceps, patellar, and achilles) 2+ and symmetric throughout. Gait not tested due to safety concern. PSYCHIATRIC: The patient is alert and oriented x 3.  SKIN: No obvious rash, lesion, or ulcer.   DATA REVIEWED:  LABORATORY PANEL:   CBC Recent Labs  Lab 11/15/18 0723  WBC 9.0  HGB 11.1*  HCT 33.7*  PLT 105*   ------------------------------------------------------------------------------------------------------------------  Chemistries  Recent Labs  Lab 11/15/18 0723  NA 139  K 3.2*  CL 103  CO2 28  GLUCOSE 112*  BUN 13  CREATININE 0.75  CALCIUM 8.7*  AST  22  ALT 15  ALKPHOS 35*  BILITOT 2.1*   ------------------------------------------------------------------------------------------------------------------  Cardiac Enzymes No results for input(s): TROPONINI in the last 168 hours. ------------------------------------------------------------------------------------------------------------------  RADIOLOGY:  Dg Chest Port 1 View  Result Date: 11/15/2018 CLINICAL DATA:  Shortness of breath EXAM: PORTABLE CHEST 1 VIEW COMPARISON:  CT angiogram chest October 03, 2018 and chest radiograph May 22, 2017 FINDINGS: The nodular opacity in the right base seen on CT is not appreciable by radiography. There is interstitial thickening in the right base with subtle ill-defined opacity, concerning for early pneumonia in the right base. Lungs elsewhere clear. Heart is enlarged with pulmonary venous hypertension evident. Patient is status post tricuspid and mitral valve replacements. There is aortic atherosclerosis. No adenopathy. No bone lesions. IMPRESSION: Question earliest changes of pneumonia in the right base. Note that the nodular opacity seen on prior CT in the right base is not appreciable by radiography. Recommendations for surveillance of this nodular opacity made and prior CT report remain in effect. There is pulmonary vascular congestion. Status post tricuspid and mitral valve replacements. Aortic Atherosclerosis (ICD10-I70.0). Electronically Signed   By: Lowella Grip III M.D.   On: 11/15/2018 08:15    EKG:  EKG: unchanged from previous tracings, atrial fibrillation, rate 103. Vent. rate 103 BPM PR interval * ms QRS duration 100 ms QT/QTc 388/508 ms P-R-T axes * 12 33 IMPRESSION AND PLAN:   73 y.o. male with past medical history of COPD, OSA on CPAP at night, chronic diastolic CHF, hypertension, CAD, atrial fibrillation on Pradaxa, hepatitis A, mitral valve repair and hyperlipidemia presenting to the ED with complaints of shortness of  breath, myalgia, fevers and chills.  1. Sepsis -likely secondary to pneumonia.  Patient presenting with fever of 103.2, RR 31, tachycardia (165 beats per minute) meeting sepsis criteria. - Admit to telemetry unit - Chest x-ray reviewed and shows questionable pneumonia in the right lobe and pulmonary vascular congestion - COVID-19 negative - Blood cultures pending - UA negative for UTI -Trend procalcitonin - Start empiric abx with ceftriaxone and azithromycin  2. Acute on chronic Congestive Heart Failure: Acute presentation likely due to volume overload with associated symptoms of SOB, BLE edema. BNP elevated at 379 - Last Echo 01/2015 with  EF 55% - Furosemide 40mg  Diureses >1L negative per day until approach euvolemia / worsening renal function. - Echocardiogram pending - Low salt diet  - Check daily weight - Strict I&Os - CHF Teaching  2.  Hypokalemia -replete and recheck in a.m. + mag  3. COPD-  No signs of exacerbation  - Supplemental O2, goal sat 88-92% - Bronchodilators (albuterol/ipratropium) PRN    4. Coronary artery disease s/p CABG x2 (1997), Cath (2011) - ASA 81mg  PO daily -  HTN, HLD, DM control as below  5. HLD  - Atorvastatin 20mg  PO qhs   6. HTN- stable - Continue enalapril and metoprolol  7. Paroxysmal atrial fibrillation -rate controlled on metoprolol -Continue Pradaxa  8. OSA - on CPAP at home  9. Chronic thrombocytopenia -patient with history of viral hepatitis no signs of bleeding  10. Elevated bilirubin -no signs of obstructive jaundice, history of hepatitis as above with normal LFTs  All the records are reviewed and case discussed with ED provider. Management plans discussed with the patient, family and they are in agreement.  CODE STATUS: FULL  TOTAL TIME TAKING CARE OF THIS PATIENT: 50 minutes.    on 11/15/2018 at 2:05 PM  Rufina Falco, DNP, FNP-BC [facility] Nurse Practitioner Between 7am to 6pm - Pager [phone]  After  6pm go to [email or web address] - password [email or web address] [facility]  Office  [phone]  CC: Primary care physician; [name], MD

## 2018-11-15 NOTE — Progress Notes (Signed)
CODE SEPSIS - PHARMACY COMMUNICATION  **Broad Spectrum Antibiotics should be administered within 1 hour of Sepsis diagnosis**  Time Code Sepsis Called/Page Received: 0727  Antibiotics Ordered: Rocephin/azithromycin  Time of 1st antibiotic administration: 7583  Additional action taken by pharmacy: NA  If necessary, Name of Provider/Nurse Contacted: NA    Tawnya Crook ,PharmD Clinical Pharmacist  11/15/2018  8:02 AM

## 2018-11-16 LAB — BASIC METABOLIC PANEL
Anion gap: 6 (ref 5–15)
BUN: 17 mg/dL (ref 8–23)
CO2: 28 mmol/L (ref 22–32)
Calcium: 8.5 mg/dL — ABNORMAL LOW (ref 8.9–10.3)
Chloride: 104 mmol/L (ref 98–111)
Creatinine, Ser: 0.75 mg/dL (ref 0.61–1.24)
GFR calc Af Amer: >60 mL/min (ref >60)
GFR calc non Af Amer: >60 mL/min (ref >60)
Glucose, Bld: 162 mg/dL — ABNORMAL HIGH (ref 70–99)
Potassium: 3.4 mmol/L — ABNORMAL LOW (ref 3.5–5.1)
Sodium: 138 mmol/L (ref 135–145)

## 2018-11-16 LAB — CBC
HCT: 32.1 % — ABNORMAL LOW (ref 39.0–52.0)
Hemoglobin: 10.5 g/dL — ABNORMAL LOW (ref 13.0–17.0)
MCH: 31.1 pg (ref 26.0–34.0)
MCHC: 32.7 g/dL (ref 30.0–36.0)
MCV: 95 fL (ref 80.0–100.0)
Platelets: 103 10*3/uL — ABNORMAL LOW (ref 150–400)
RBC: 3.38 MIL/uL — ABNORMAL LOW (ref 4.22–5.81)
RDW: 13.2 % (ref 11.5–15.5)
WBC: 7.8 10*3/uL (ref 4.0–10.5)
nRBC: 0 % (ref 0.0–0.2)

## 2018-11-16 LAB — URINE CULTURE: Culture: NO GROWTH

## 2018-11-16 LAB — MAGNESIUM: Magnesium: 2.1 mg/dL (ref 1.7–2.4)

## 2018-11-16 LAB — SARS CORONAVIRUS 2 (TAT 6-24 HRS): SARS Coronavirus 2: NEGATIVE

## 2018-11-16 NOTE — Progress Notes (Signed)
Family Meeting Note  Advance Directive:yes  Today a meeting took place with the Patient.    The following clinical team members were present during this meeting:MD  The following were discussed:Patient's diagnosis: Sepsis, pneumonia, COVID-test negative, paroxysmal atrial fibrillation, obstructive sleep apnea on CPAP nightly, hypertension is admitted to the hospital.  Treatment plan of care discussed in detail with the patient.  He verbalized understanding of the plan   patient's progosis: Unable to determine and Goals for treatment: Full Code  Wife Leslie Prince healthcare power of attorney  Additional follow-up to be provided: Hospitalist  Time spent during discussion:17 min  Nicholes Mango, MD

## 2018-11-16 NOTE — Progress Notes (Signed)
[place] at [place] NAME: Leslie Prince    MR#:  [id number]  DATE OF BIRTH:  1945-12-06  SUBJECTIVE:  CHIEF COMPLAINT:  Pt is feeling fine , sob better , COVID neg 2 times Some cough is present  REVIEW OF SYSTEMS:  CONSTITUTIONAL: No fever, fatigue or weakness.  EYES: No blurred or double vision.  EARS, NOSE, AND THROAT: No tinnitus or ear pain.  RESPIRATORY: Some cough, denies shortness of breath, wheezing or hemoptysis.  CARDIOVASCULAR: No chest pain, orthopnea, edema.  GASTROINTESTINAL: No nausea, vomiting, diarrhea or abdominal pain.  GENITOURINARY: No dysuria, hematuria.  ENDOCRINE: No polyuria, nocturia,  HEMATOLOGY: No anemia, easy bruising or bleeding SKIN: No rash or lesion. MUSCULOSKELETAL: No joint pain or arthritis.   NEUROLOGIC: No tingling, numbness, weakness.  PSYCHIATRY: No anxiety or depression.   DRUG ALLERGIES:   Allergies  Allergen Reactions  . Lisinopril Other (See Comments)    Severe urination    VITALS:  Blood pressure (!) 134/91, pulse 78, temperature 98.1 F (36.7 C), temperature source Oral, resp. rate 19, height 5\' 9"  (1.753 m), weight 126.2 kg, SpO2 97 %.  PHYSICAL EXAMINATION:  GENERAL:  73 y.o.-year-old patient lying in the bed with no acute distress.  EYES: Pupils equal, round, reactive to light and accommodation. No scleral icterus. Extraocular muscles intact.  HEENT: Head atraumatic, normocephalic. Oropharynx and nasopharynx clear.  NECK:  Supple, no jugular venous distention. No thyroid enlargement, no tenderness.  LUNGS: Mod  breath sounds bilaterally, no wheezing, rales,rhonchi , positive  crepitation. No use of accessory muscles of respiration.  CARDIOVASCULAR: S1, S2 normal. No murmurs, rubs, or gallops.  ABDOMEN: Soft, nontender, nondistended. Bowel sounds present.  EXTREMITIES: No pedal edema, cyanosis, or clubbing.  NEUROLOGIC: Cranial nerves II through XII are intact. Muscle  strength 5/5 in all extremities. Sensation intact. Gait not checked.  PSYCHIATRIC: The patient is alert and oriented x 3.  SKIN: No obvious rash, lesion, or ulcer.    LABORATORY PANEL:   CBC Recent Labs  Lab 11/16/18 0227  WBC 7.8  HGB 10.5*  HCT 32.1*  PLT 103*   ------------------------------------------------------------------------------------------------------------------  Chemistries  Recent Labs  Lab 11/15/18 0723 11/16/18 0227  NA 139 138  K 3.2* 3.4*  CL 103 104  CO2 28 28  GLUCOSE 112* 162*  BUN 13 17  CREATININE 0.75 0.75  CALCIUM 8.7* 8.5*  MG  --  2.1  AST 22  --   ALT 15  --   ALKPHOS 35*  --   BILITOT 2.1*  --    ------------------------------------------------------------------------------------------------------------------  Cardiac Enzymes No results for input(s): TROPONINI in the last 168 hours. ------------------------------------------------------------------------------------------------------------------  RADIOLOGY:  Dg Chest Port 1 View  Result Date: 11/15/2018 CLINICAL DATA:  Shortness of breath EXAM: PORTABLE CHEST 1 VIEW COMPARISON:  CT angiogram chest October 03, 2018 and chest radiograph May 22, 2017 FINDINGS: The nodular opacity in the right base seen on CT is not appreciable by radiography. There is interstitial thickening in the right base with subtle ill-defined opacity, concerning for early pneumonia in the right base. Lungs elsewhere clear. Heart is enlarged with pulmonary venous hypertension evident. Patient is status post tricuspid and mitral valve replacements. There is aortic atherosclerosis. No adenopathy. No bone lesions. IMPRESSION: Question earliest changes of pneumonia in the right base. Note that the nodular opacity seen on prior CT in the right base is not appreciable by radiography. Recommendations for surveillance of this nodular opacity made and prior  CT report remain in effect. There is pulmonary vascular congestion.  Status post tricuspid and mitral valve replacements. Aortic Atherosclerosis (ICD10-I70.0). Electronically Signed   By: Lowella Grip III M.D.   On: 11/15/2018 08:15    EKG:   Orders placed or performed in visit on 11/15/18  . EKG 12-Lead    ASSESSMENT AND PLAN:   73 y.o. male with past medical history of COPD, OSA on CPAP at night, chronic diastolic CHF, hypertension, CAD, atrial fibrillation on Pradaxa, hepatitis A, mitral valve repair and hyperlipidemia presenting to the ED with complaints of shortness of breath, myalgia, fevers and chills.  1. Sepsis -likely secondary to pneumonia.  Patient presenting with fever of 103.2, RR 31, tachycardia (165 beats per minute) meeting sepsis criteria. -Clinically improving - Chest x-ray reviewed and shows questionable pneumonia in the right lobe and pulmonary vascular congestion - COVID-19 negative, flu test is negative - Blood cultures -no growth so far - UA negative for UTI, urine culture pending -Lactic acid was 0.9, procalcitonin less than 0.10, no leukocytosis - continue  abx with ceftriaxone and azithromycin  2. Acute on chronicCongestive Heart Failure: Acute presentation likely due to volume overload with associated symptoms of SOB, BLE edema. BNP elevated at 379 -Last Echo 01/2015 withEF 55% - Furosemide 40mg  Diureses >1L negative per day until approach euvolemia / worsening renal function. - Echocardiogram pending - Low salt diet - Check daily weight - Strict I&Os - CHF Teaching  2.  Hypokalemia -replete and recheck in a.m. + mag  3. COPD- No signs of exacerbation - Supplemental O2, goal sat 88-92% - Bronchodilators (albuterol/ipratropium) PRN  4.Coronary artery disease s/p CABG x2 (1997), Cath (2011) - ASA 81mg  PO daily - HTN, HLD, DM control as below  5.HLD  - Atorvastatin 20mg  PO qhs  6.HTN- stable - Continue enalapril and metoprolol  7. Paroxysmal atrial fibrillation -rate controlled on  metoprolol -Continue Pradaxa  8. OSA - on CPAP at home  9. Chronic thrombocytopenia -patient with history of viral hepatitis no signs of bleeding  10. Elevated bilirubin -no signs of obstructive jaundice, history of hepatitis as above with normal LFTs continue monitoring patient clinically     All the records are reviewed and case discussed with Care Management/Social Workerr. Management plans discussed with the patient, wife Lattie Haw and they are in agreement.  CODE STATUS: fc   TOTAL TIME TAKING CARE OF THIS PATIENT: 35  minutes.   POSSIBLE D/C IN 1-2  DAYS, DEPENDING ON CLINICAL CONDITION.  Note: This dictation was prepared with Dragon dictation along with smaller phrase technology. Any transcriptional errors that result from this process are unintentional.   Nicholes Mango M.D on 11/16/2018 at 3:29 PM  Between 7am to 6pm - Pager - [phone] After 6pm go to [email or web address] - password [email or web address] [facility]  Office  [phone]  CC: Primary care physician; [name], MD

## 2018-11-16 NOTE — Progress Notes (Signed)
Repeat COVID test negative. D/C isolation precautions per Dr. Margaretmary Eddy.

## 2018-11-17 ENCOUNTER — Inpatient Hospital Stay
Admit: 2018-11-17 | Discharge: 2018-11-17 | Disposition: A | Discharge status: Home / Self Care | Payer: Medicare Other | Attending: Nurse Practitioner | Admitting: Nurse Practitioner

## 2018-11-17 MED ORDER — CEFDINIR 300 MG PO CAPS: 300.0000 mg | ORAL_CAPSULE | Freq: Two times a day (BID) | ORAL | 0 refills | Status: DC

## 2018-11-17 MED ORDER — POTASSIUM CHLORIDE CRYS ER 20 MEQ PO TBCR
40.0000 meq | EXTENDED_RELEASE_TABLET | Freq: Once | ORAL | Status: AC
  Administered 2018-11-17: 40 meq via ORAL
  Filled 2018-11-17: qty 2

## 2018-11-17 MED ORDER — CEFDINIR 300 MG PO CAPS
300.0000 mg | ORAL_CAPSULE | Freq: Two times a day (BID) | ORAL | Status: DC
  Filled 2018-11-17: qty 1

## 2018-11-17 MED ORDER — AZITHROMYCIN 250 MG PO TABS: ORAL_TABLET | ORAL | 0 refills | Status: DC

## 2018-11-17 NOTE — Discharge Instructions (Signed)
Follow-up with primary care physician in 2 to 3 days Follow-up with primary cardiology as scheduled or in a week

## 2018-11-17 NOTE — Plan of Care (Signed)
  Problem: Health Behavior/Discharge Planning: Goal: Ability to manage health-related needs will improve Outcome: Progressing   Problem: Clinical Measurements: Goal: Ability to maintain clinical measurements within normal limits will improve Outcome: Progressing Goal: Will remain free from infection Outcome: Progressing Goal: Diagnostic test results will improve Outcome: Progressing Goal: Respiratory complications will improve Outcome: Progressing Goal: Cardiovascular complication will be avoided Outcome: Progressing   Problem: Activity: Goal: Risk for activity intolerance will decrease Outcome: Progressing   Problem: Elimination: Goal: Will not experience complications related to bowel motility Outcome: Progressing Goal: Will not experience complications related to urinary retention Outcome: Progressing   Problem: Pain Managment: Goal: General experience of comfort will improve Outcome: Progressing   Problem: Safety: Goal: Ability to remain free from injury will improve Outcome: Progressing

## 2018-11-17 NOTE — Discharge Summary (Signed)
[facility] [place] NAME: Leslie Prince    MR#:  [id number]  DATE OF BIRTH:  1945-10-15  DATE OF ADMISSION:  11/15/2018 ADMITTING PHYSICIAN: Lang Snow, NP  DATE OF DISCHARGE: No discharge date for patient encounter.  PRIMARY CARE PHYSICIAN: [name], MD    ADMISSION DIAGNOSIS:  Pneumonia of right lower lobe due to infectious organism [facility]) [J18.1] Sepsis, due to unspecified organism, unspecified whether acute organ dysfunction present ([place]) [A41.9]  DISCHARGE DIAGNOSIS:  Active Problems:   Sepsis ([place])   SECONDARY DIAGNOSIS:   Past Medical History:  Diagnosis Date  . CHF (congestive heart failure) ([place])   . COPD (chronic obstructive pulmonary disease) ([place])   . Sleep apnea     HOSPITAL COURSE:  73 year old male with past medical history of COPD, OSA on CPAP at night, chronic diastolic CHF, hypertension, CAD, atrial fibrillation on Pradaxa, hepatitis A, mitral valve repair and hyperlipidemia presenting to the ED with complaints of shortness of breath, myalgia, fevers and chills.  Patient describes onset of symptoms since yesterday with progressive worsening.  Per patient's wife who is currently at the bedside, she noticed that patient was unusually tired and sleepy for the most part since Tuesday.  She thought it may be due to fatigue from moving to a new residence.  She stated patient was complaining of fevers and needing a date channel covers at night which is unusual for him.  Denies associated symptoms of chest pain, diaphoresis, nausea vomiting, diarrhea or other GI or respiratory symptoms.  On arrival to the ED, he was aebrile temperature of 103.2 with blood pressure 150/84 mm Hg and pulse rate 165 beats/min, respiration 31. There were no focal neurological deficits; he was alert and oriented x4, and he did not demonstrate any memory deficits.  Initial labs revealed lactic levels of 0.9, WBC 9.0,  potassium 3.2, bili 2.1, lipase 21, platelets 105, procalcitonin<0.1, COVID-19 negative.  UA did not show evidence of UTI.  Chest x-ray shows questionable pneumonia in the right base and pulmonary vascular congestion.  Code sepsis was initiated patient started on ceftriaxone and azithromycin.  He will be admitted under hospitalist service for further management   1.Sepsis-likely secondary to pneumonia. Patient presenting with fever of 103.2, RR 31, tachycardia (165beats per minute)meeting sepsis criteria. -Clinically improving -Chest x-ray reviewed and shows questionable pneumonia in the right lobe and pulmonary vascular congestion -COVID-19 negative, flu test is negative -Blood cultures -no growth so far -UA negative for UTI, urine culture negative -Lactic acid was 0.9, procalcitonin less than 0.10, no leukocytosis -continue abxwith ceftriaxone and azithromycin.  Clinically feeling much better discharge home with Omnicef and azithromycin to complete the course  2. Acute on chronicCongestive Heart Failure: Acute presentation likely due to volume overload with associated symptoms of SOB, BLE edema. BNP elevated at379 -Last Echo10/2016withEF55% - Furosemide 40mg  Diureses >1L negative per day until approach euvolemia / worsening renal function. - Echocardiogram done results pending cardiology to follow - Low salt diet - Check daily weight - Strict I&Os - CHF Teaching  2.Hypokalemia -repleted here magnesium at 2.1  3. COPD- No signs of exacerbation - Supplemental O2, goal sat 88-92% - Bronchodilators (albuterol/ipratropium) PRN  4.Coronary artery disease s/pCABG x2 (1997), Cath (2011) - ASA 81mg  PO daily - HTN, HLD, DM control as below  5.HLD  - Atorvastatin20mg  PO qhs  6.HTN- stable -Continue enalapril and metoprolol  7.Paroxysmal atrial fibrillation-rate controlled on metoprolol -Continue Pradaxa  8. OSA -  on CPAP at home  9.Chronic  thrombocytopenia-patient with history of viral hepatitis no signs of bleeding  10.Elevated bilirubin-no signs of obstructive jaundice,history of hepatitis as above with normal LFTs continue monitoring patient clinically  Plan of care discussed with the patient and his wife over phone they both verbalized understanding and agreeable to be discharged DISCHARGE CONDITIONS:   Stable  CONSULTS OBTAINED:     PROCEDURES none  DRUG ALLERGIES:   Allergies  Allergen Reactions  . Lisinopril Other (See Comments)    Severe urination    DISCHARGE MEDICATIONS:   Allergies as of 11/17/2018      Reactions   Lisinopril Other (See Comments)   Severe urination      Medication List    TAKE these medications   Advair Diskus 250-50 MCG/DOSE Aepb Generic drug: Fluticasone-Salmeterol Inhale 1 puff into the lungs 2 (two) times daily.   aspirin EC 81 MG tablet Take 81 mg by mouth daily.   atorvastatin 20 MG tablet Commonly known as: LIPITOR Take 20 mg by mouth daily.   azithromycin 250 MG tablet Commonly known as: Zithromax Take 1 tablet once daily   cefdinir 300 MG capsule Commonly known as: OMNICEF Take 1 capsule (300 mg total) by mouth every 12 (twelve) hours.   Chromium 400 MCG Tabs Take 1 tablet by mouth 2 (two) times daily.   colchicine 0.6 MG tablet Take 0.6 mg by mouth as directed. Take 2 tablets at onset of gout flare, then 1 tablet one hour later   enalapril 20 MG tablet Commonly known as: VASOTEC Take 20 mg by mouth 2 (two) times daily.   furosemide 40 MG tablet Commonly known as: LASIX Take 40 mg by mouth daily.   Klor-Con M10 10 MEQ tablet Generic drug: potassium chloride Take 10 mEq by mouth 2 (two) times daily.   metoprolol succinate 100 MG 24 hr tablet Commonly known as: TOPROL-XL Take 100 mg by mouth daily.   montelukast 10 MG tablet Commonly known as: SINGULAIR TAKE 1 TABLET (10 MG TOTAL) BY MOUTH NIGHTLY.   Multiple Vitamins tablet Take 1  tablet by mouth daily.   Pradaxa 150 MG Caps capsule Generic drug: dabigatran Take 150 mg by mouth 2 (two) times daily.   albuterol (2.5 MG/3ML) 0.083% nebulizer solution Commonly known as: PROVENTIL Take 2.5 mg by nebulization every 6 (six) hours as needed for wheezing.   ProAir HFA 108 (90 Base) MCG/ACT inhaler Generic drug: albuterol Inhale 2 puffs into the lungs every 6 (six) hours as needed for wheezing.   tamsulosin 0.4 MG Caps capsule Commonly known as: FLOMAX Take 1 capsule (0.4 mg total) by mouth daily after supper.   tiotropium 18 MCG inhalation capsule Commonly known as: SPIRIVA Place 18 mcg into inhaler and inhale daily.   vitamin C 500 MG tablet Commonly known as: ASCORBIC ACID Take 500 mg by mouth daily.   vitamin E 400 UNIT capsule Take 400 Units by mouth daily.        DISCHARGE INSTRUCTIONS:   Follow-up with primary care physician in 2 to 3 days Follow-up with primary cardiology as scheduled or in a week  DIET:  Cardiac diet  DISCHARGE CONDITION:  Fair  ACTIVITY:  Activity as tolerated  OXYGEN:  Home Oxygen: No.   Oxygen Delivery: room air  DISCHARGE LOCATION:  home   If you experience worsening of your admission symptoms, develop shortness of breath, life threatening emergency, suicidal or homicidal thoughts you must seek medical attention immediately by calling 911  or calling your MD immediately  if symptoms less severe.  You Must read complete instructions/literature along with all the possible adverse reactions/side effects for all the Medicines you take and that have been prescribed to you. Take any new Medicines after you have completely understood and accpet all the possible adverse reactions/side effects.   Please note  You were cared for by a hospitalist during your hospital stay. If you have any questions about your discharge medications or the care you received while you were in the hospital after you are discharged, you can call  the unit and asked to speak with the hospitalist on call if the hospitalist that took care of you is not available. Once you are discharged, your primary care physician will handle any further medical issues. Please note that NO REFILLS for any discharge medications will be authorized once you are discharged, as it is imperative that you return to your primary care physician (or establish a relationship with a primary care physician if you do not have one) for your aftercare needs so that they can reassess your need for medications and monitor your lab values.     Today  Chief Complaint  Patient presents with  . Shortness of Breath   Patient is feeling fine.  Denies any chest pain shortness of breath or cough.  Wants to go home  ROS:  CONSTITUTIONAL: Denies fevers, chills. Denies any fatigue, weakness.  EYES: Denies blurry vision, double vision, eye pain. EARS, NOSE, THROAT: Denies tinnitus, ear pain, hearing loss. RESPIRATORY: Denies cough, wheeze, shortness of breath.  CARDIOVASCULAR: Denies chest pain, palpitations, edema.  GASTROINTESTINAL: Denies nausea, vomiting, diarrhea, abdominal pain. Denies bright red blood per rectum. GENITOURINARY: Denies dysuria, hematuria. ENDOCRINE: Denies nocturia or thyroid problems. HEMATOLOGIC AND LYMPHATIC: Denies easy bruising or bleeding. SKIN: Denies rash or lesion. MUSCULOSKELETAL: Denies pain in neck, back, shoulder, knees, hips or arthritic symptoms.  NEUROLOGIC: Denies paralysis, paresthesias.  PSYCHIATRIC: Denies anxiety or depressive symptoms.   VITAL SIGNS:  Blood pressure 133/87, pulse 72, temperature 97.7 F (36.5 C), temperature source Oral, resp. rate 20, height 5\' 9"  (1.753 m), weight 128.2 kg, SpO2 97 %.  I/O:    Intake/Output Summary (Last 24 hours) at 11/17/2018 1327 Last data filed at 11/17/2018 1153 Gross per 24 hour  Intake 100 ml  Output 3250 ml  Net -3150 ml    PHYSICAL EXAMINATION:  GENERAL:  73 y.o.-year-old  patient lying in the bed with no acute distress.  EYES: Pupils equal, round, reactive to light and accommodation. No scleral icterus. Extraocular muscles intact.  HEENT: Head atraumatic, normocephalic. Oropharynx and nasopharynx clear.  NECK:  Supple, no jugular venous distention. No thyroid enlargement, no tenderness.  LUNGS: Normal breath sounds bilaterally, no wheezing, rales,rhonchi or crepitation. No use of accessory muscles of respiration.  CARDIOVASCULAR: S1, S2 normal. No murmurs, rubs, or gallops.  ABDOMEN: Soft, non-tender, non-distended. Bowel sounds present. EXTREMITIES: No pedal edema, cyanosis, or clubbing.  NEUROLOGIC: Cranial nerves II through XII are intact. Muscle strength 5/5 in all extremities. Sensation intact. Gait not checked.  PSYCHIATRIC: The patient is alert and oriented x 3.  SKIN: No obvious rash, lesion, or ulcer.   DATA REVIEW:   CBC Recent Labs  Lab 11/16/18 0227  WBC 7.8  HGB 10.5*  HCT 32.1*  PLT 103*    Chemistries  Recent Labs  Lab 11/15/18 0723 11/16/18 0227  NA 139 138  K 3.2* 3.4*  CL 103 104  CO2 28 28  GLUCOSE 112*  162*  BUN 13 17  CREATININE 0.75 0.75  CALCIUM 8.7* 8.5*  MG  --  2.1  AST 22  --   ALT 15  --   ALKPHOS 35*  --   BILITOT 2.1*  --     Cardiac Enzymes No results for input(s): TROPONINI in the last 168 hours.  Microbiology Results  Results for orders placed or performed during the hospital encounter of 11/15/18  Blood Culture (routine x 2)     Status: None (Preliminary result)   Collection Time: 11/15/18  7:31 AM   Specimen: BLOOD  Result Value Ref Range Status   Specimen Description BLOOD LAC  Final   Special Requests   Final    BOTTLES DRAWN AEROBIC AND ANAEROBIC Blood Culture results may not be optimal due to an excessive volume of blood received in culture bottles   Culture   Final    NO GROWTH 2 DAYS Performed at [facility], [address] [place], [place] [address]    Report Status  PENDING  Incomplete  Blood Culture (routine x 2)     Status: None (Preliminary result)   Collection Time: 11/15/18  7:31 AM   Specimen: BLOOD  Result Value Ref Range Status   Specimen Description BLOOD L FARM  Final   Special Requests   Final    BOTTLES DRAWN AEROBIC AND ANAEROBIC Blood Culture results may not be optimal due to an excessive volume of blood received in culture bottles   Culture   Final    NO GROWTH 2 DAYS Performed at [facility], [address], [place], [place] [address]    Report Status PENDING  Incomplete  SARS Coronavirus 2 [facility] order, Performed in [facility] hospital lab) Nasopharyngeal Nasopharyngeal Swab     Status: None   Collection Time: 11/15/18  7:31 AM   Specimen: Nasopharyngeal Swab  Result Value Ref Range Status   SARS Coronavirus 2 NEGATIVE NEGATIVE Final    Comment: (NOTE) If result is NEGATIVE SARS-CoV-2 target nucleic acids are NOT DETECTED. The SARS-CoV-2 RNA is generally detectable in upper and lower  respiratory specimens during the acute phase of infection. The lowest  concentration of SARS-CoV-2 viral copies this assay can detect is 250  copies / mL. A negative result does not preclude SARS-CoV-2 infection  and should not be used as the sole basis for treatment or other  patient management decisions.  A negative result may occur with  improper specimen collection / handling, submission of specimen other  than nasopharyngeal swab, presence of viral mutation(s) within the  areas targeted by this assay, and inadequate number of viral copies  (<250 copies / mL). A negative result must be combined with clinical  observations, patient history, and epidemiological information. If result is POSITIVE SARS-CoV-2 target nucleic acids are DETECTED. The SARS-CoV-2 RNA is generally detectable in upper and lower  respiratory specimens dur ing the acute phase of infection.  Positive  results are indicative of active infection with  SARS-CoV-2.  Clinical  correlation with patient history and other diagnostic information is  necessary to determine patient infection status.  Positive results do  not rule out bacterial infection or co-infection with other viruses. If result is PRESUMPTIVE POSTIVE SARS-CoV-2 nucleic acids MAY BE PRESENT.   A presumptive positive result was obtained on the submitted specimen  and confirmed on repeat testing.  While 2019 novel coronavirus  (SARS-CoV-2) nucleic acids may be present in the submitted sample  additional confirmatory testing may  be necessary for epidemiological  and / or clinical management purposes  to differentiate between  SARS-CoV-2 and other Sarbecovirus currently known to infect humans.  If clinically indicated additional testing with an alternate test  methodology [phone]) is advised. The SARS-CoV-2 RNA is generally  detectable in upper and lower respiratory sp ecimens during the acute  phase of infection. The expected result is Negative. Fact Sheet for Patients:  StrictlyIdeas.no Fact Sheet for Healthcare Providers: BankingDealers.co.za This test is not yet approved or cleared by the [place] FDA and has been authorized for detection and/or diagnosis of SARS-CoV-2 by FDA under an Emergency Use Authorization (EUA).  This EUA will remain in effect (meaning this test can be used) for the duration of the COVID-19 declaration under Section 564(b)(1) of the Act, 21 U.S.C. section 360bbb-3(b)(1), unless the authorization is terminated or revoked sooner. Performed at [facility], [address] [place], [place] [address]   Urine culture     Status: None   Collection Time: 11/15/18  9:16 AM   Specimen: In/Out Cath Urine  Result Value Ref Range Status   Specimen Description   Final    IN/OUT CATH URINE Performed at [facility], [address], [place], [place] [address]    Special Requests    Final    NONE Performed at [facility], [address], [place], [place] [address]    Culture   Final    NO GROWTH Performed at [facility], [place] [address] [place], [place] [address]    Report Status 11/16/2018 FINAL  Final  SARS CORONAVIRUS 2     Status: None   Collection Time: 11/15/18 10:28 PM  Result Value Ref Range Status   SARS Coronavirus 2 NEGATIVE NEGATIVE Final    Comment: (NOTE) SARS-CoV-2 target nucleic acids are NOT DETECTED. The SARS-CoV-2 RNA is generally detectable in upper and lower respiratory specimens during the acute phase of infection. Negative results do not preclude SARS-CoV-2 infection, do not rule out co-infections with other pathogens, and should not be used as the sole basis for treatment or other patient management decisions. Negative results must be combined with clinical observations, patient history, and epidemiological information. The expected result is Negative. Fact Sheet for Patients: SugarRoll.be Fact Sheet for Healthcare Providers: https://www.woods-mathews.com/ This test is not yet approved or cleared by the [place] FDA and  has been authorized for detection and/or diagnosis of SARS-CoV-2 by FDA under an Emergency Use Authorization (EUA). This EUA will remain  in effect (meaning this test can be used) for the duration of the COVID-19 declaration under Section 56 4(b)(1) of the Act, 21 U.S.C. section 360bbb-3(b)(1), unless the authorization is terminated or revoked sooner. Performed at [facility], [place] [address], [place], [place] [address]     RADIOLOGY:  Dg Chest Port 1 View  Result Date: 11/15/2018 CLINICAL DATA:  Shortness of breath EXAM: PORTABLE CHEST 1 VIEW COMPARISON:  CT angiogram chest October 03, 2018 and chest radiograph May 22, 2017 FINDINGS: The nodular opacity in the right base seen on CT is not appreciable by radiography. There is interstitial  thickening in the right base with subtle ill-defined opacity, concerning for early pneumonia in the right base. Lungs elsewhere clear. Heart is enlarged with pulmonary venous hypertension evident. Patient is status post tricuspid and mitral valve replacements. There is aortic atherosclerosis. No adenopathy. No bone lesions. IMPRESSION: Question earliest changes of pneumonia in the right base. Note that the nodular opacity seen  on prior CT in the right base is not appreciable by radiography. Recommendations for surveillance of this nodular opacity made and prior CT report remain in effect. There is pulmonary vascular congestion. Status post tricuspid and mitral valve replacements. Aortic Atherosclerosis (ICD10-I70.0). Electronically Signed   By: Lowella Grip III M.D.   On: 11/15/2018 08:15    EKG:   Orders placed or performed in visit on 11/15/18  . EKG 12-Lead      Management plans discussed with the patient, wife Lattie Haw over phone and they are in agreement.  CODE STATUS:     Code Status Orders  (From admission, onward)         Start     Ordered   11/15/18 1104  Full code  Continuous     11/15/18 1107        Code Status History    Date Active Date Inactive Code Status Order ID Comments User Context   05/22/2017 0538 05/25/2017 1732 Full Code [id number]  [name], MD ED   Advance Care Planning Activity      TOTAL TIME TAKING CARE OF THIS PATIENT: 45  minutes.   Note: This dictation was prepared with Dragon dictation along with smaller phrase technology. Any transcriptional errors that result from this process are unintentional.   @[facility] @  on 11/17/2018 at 1:27 PM  Between 7am to 6pm - Pager - [phone]  After 6pm go to [email or web address] - password [email or web address] [facility]  Office  [phone]  CC: Primary care physician; [name], MD

## 2018-11-18 LAB — ECHOCARDIOGRAM COMPLETE
Height: 69 in
Weight: 4521.6 oz

## 2018-11-19 ENCOUNTER — Other Ambulatory Visit: Payer: Self-pay

## 2018-11-20 ENCOUNTER — Ambulatory Visit
Admission: RE | Admit: 2018-11-20 | Discharge: 2018-11-20 | Disposition: A | Discharge status: Home / Self Care | Payer: Medicare Other | Source: Ambulatory Visit | Attending: Radiation Oncology | Admitting: Radiation Oncology

## 2018-11-20 ENCOUNTER — Other Ambulatory Visit: Payer: Self-pay

## 2018-11-20 ENCOUNTER — Encounter: Payer: Self-pay | Admitting: Radiation Oncology

## 2018-11-20 VITALS — BP 147/82 | HR 79 | Temp 99.2°F | Resp 20 | Wt 280.5 lb

## 2018-11-20 DIAGNOSIS — C61 Malignant neoplasm of prostate: Secondary | ICD-10-CM | POA: Diagnosis present

## 2018-11-20 DIAGNOSIS — Z923 Personal history of irradiation: Secondary | ICD-10-CM | POA: Diagnosis not present

## 2018-11-20 LAB — CULTURE, BLOOD (ROUTINE X 2)
Culture: NO GROWTH
Culture: NO GROWTH

## 2018-11-20 NOTE — Progress Notes (Signed)
Radiation Oncology Follow up Note  Name: Leslie Prince   Date:   11/20/2018 MRN:  [id number] DOB: 1945/12/10    This 73 y.o. 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  REFERRING PROVIDER: [name], MD  HPI: Patient is a 73 year old male now about 4 months having completed IMRT radiation therapy to his prostate and pelvic nodes for Gleason 8 (4+4) adenocarcinoma the prostate presenting with a PSA of 5.9 seen today in routine follow-up he is quite weak and worn out he was recently hospitalized for.  Pneumonia of the right lower lobe.  He is slowly recovering from that he specifically denies any increased lower urinary tract symptoms or diarrhea.  His most recent PSA is 6.43.  COMPLICATIONS OF TREATMENT: none  FOLLOW UP COMPLIANCE: keeps appointments   PHYSICAL EXAM:  BP (!) 147/82   Pulse 79   Temp 99.2 F (37.3 C) (Tympanic)   Resp 20   Wt 280 lb 8 oz (127.2 kg)   BMI 41.42 kg/m  Well-developed well-nourished patient in NAD. HEENT reveals PERLA, EOMI, discs not visualized.  Oral cavity is clear. No oral mucosal lesions are identified. Neck is clear without evidence of cervical or supraclavicular adenopathy. Lungs are clear to A&P. Cardiac examination is essentially unremarkable with regular rate and rhythm without murmur rub or thrill. Abdomen is benign with no organomegaly or masses noted. Motor sensory and DTR levels are equal and symmetric in the upper and lower extremities. Cranial nerves II through XII are grossly intact. Proprioception is intact. No peripheral adenopathy or edema is identified. No motor or sensory levels are noted. Crude visual fields are within normal range.  RADIOLOGY RESULTS: No current films for review  PLAN: Present time patient is doing well under excellent biochemical control of his prostate cancer.  He  continues on androgen deprivation therapy through urology.  I have asked to see him back in 6 months with a PSA at that time.  Patient knows to call with any concerns at any time.  I would like to take this opportunity to thank you for allowing me to participate in the care of your patient.Noreene Filbert, MD

## 2018-11-25 ENCOUNTER — Ambulatory Visit: Payer: Medicare Other | Admitting: Family

## 2018-12-10 ENCOUNTER — Other Ambulatory Visit: Payer: Self-pay | Admitting: Family Medicine

## 2018-12-10 ENCOUNTER — Other Ambulatory Visit: Payer: Self-pay | Admitting: Radiation Oncology

## 2018-12-10 DIAGNOSIS — R222 Localized swelling, mass and lump, trunk: Secondary | ICD-10-CM

## 2018-12-10 DIAGNOSIS — R053 Chronic cough: Secondary | ICD-10-CM

## 2018-12-10 DIAGNOSIS — I712 Thoracic aortic aneurysm, without rupture, unspecified: Secondary | ICD-10-CM

## 2018-12-10 DIAGNOSIS — R05 Cough: Secondary | ICD-10-CM

## 2019-02-27 ENCOUNTER — Telehealth: Payer: Self-pay | Admitting: Urology

## 2019-02-27 NOTE — Telephone Encounter (Signed)
PA FOR LUPRON/ELIGARD [id number] 02-27-2019 THRU 02-27-2020 FOR [place]

## 2019-03-28 ENCOUNTER — Other Ambulatory Visit: Payer: Self-pay | Admitting: Family Medicine

## 2019-03-28 DIAGNOSIS — R05 Cough: Secondary | ICD-10-CM

## 2019-03-28 DIAGNOSIS — R053 Chronic cough: Secondary | ICD-10-CM

## 2019-03-28 DIAGNOSIS — R222 Localized swelling, mass and lump, trunk: Secondary | ICD-10-CM

## 2019-04-01 ENCOUNTER — Other Ambulatory Visit: Payer: Self-pay | Admitting: Family Medicine

## 2019-04-01 DIAGNOSIS — R05 Cough: Secondary | ICD-10-CM

## 2019-04-01 DIAGNOSIS — R053 Chronic cough: Secondary | ICD-10-CM

## 2019-04-01 DIAGNOSIS — R222 Localized swelling, mass and lump, trunk: Secondary | ICD-10-CM

## 2019-04-08 ENCOUNTER — Ambulatory Visit
Admission: RE | Admit: 2019-04-08 | Discharge: 2019-04-08 | Disposition: A | Discharge status: Home / Self Care | Payer: Medicare Other | Source: Ambulatory Visit | Attending: Family Medicine | Admitting: Family Medicine

## 2019-04-08 ENCOUNTER — Other Ambulatory Visit: Payer: Self-pay

## 2019-04-08 DIAGNOSIS — R05 Cough: Secondary | ICD-10-CM | POA: Diagnosis present

## 2019-04-08 DIAGNOSIS — R222 Localized swelling, mass and lump, trunk: Secondary | ICD-10-CM | POA: Insufficient documentation

## 2019-04-08 DIAGNOSIS — R053 Chronic cough: Secondary | ICD-10-CM

## 2019-04-08 HISTORY — DX: Malignant (primary) neoplasm, unspecified: C80.1

## 2019-04-08 HISTORY — DX: Essential (primary) hypertension: I10

## 2019-04-08 MED ORDER — IOHEXOL 350 MG/ML SOLN
75.0000 mL | Freq: Once | INTRAVENOUS | Status: AC | PRN
  Administered 2019-04-08: 75 mL via INTRAVENOUS

## 2019-04-16 ENCOUNTER — Ambulatory Visit: Payer: Medicare Other

## 2019-04-21 ENCOUNTER — Other Ambulatory Visit: Payer: Self-pay

## 2019-04-21 ENCOUNTER — Ambulatory Visit (INDEPENDENT_AMBULATORY_CARE_PROVIDER_SITE_OTHER): Payer: Medicare PPO

## 2019-04-21 DIAGNOSIS — C61 Malignant neoplasm of prostate: Secondary | ICD-10-CM | POA: Diagnosis not present

## 2019-04-21 MED ORDER — LEUPROLIDE ACETATE (6 MONTH) 45 MG ~~LOC~~ KIT
45.0000 mg | PACK | Freq: Once | SUBCUTANEOUS | Status: AC
  Administered 2019-04-21: 45 mg via SUBCUTANEOUS

## 2019-04-21 NOTE — Progress Notes (Signed)
Eligard SubQ Injection   Due to Prostate Cancer patient is present today for a Eligard Injection.  Medication: Eligard 6 month Dose: 45 mg  Location: right  Lot: [id number] Exp: 01/07/2021  Patient tolerated well, no complications were noted  Performed by: Bradly Bienenstock, CMA

## 2019-04-30 DIAGNOSIS — R600 Localized edema: Secondary | ICD-10-CM | POA: Insufficient documentation

## 2019-05-01 DIAGNOSIS — Z6841 Body Mass Index (BMI) 40.0 and over, adult: Secondary | ICD-10-CM | POA: Insufficient documentation

## 2019-05-19 ENCOUNTER — Other Ambulatory Visit: Payer: Medicare Other

## 2019-05-26 ENCOUNTER — Ambulatory Visit: Payer: Medicare Other | Admitting: Radiation Oncology

## 2019-05-26 ENCOUNTER — Inpatient Hospital Stay: Payer: Medicare PPO

## 2019-05-30 ENCOUNTER — Inpatient Hospital Stay: Payer: Medicare PPO | Attending: Radiation Oncology

## 2019-05-30 ENCOUNTER — Other Ambulatory Visit: Payer: Self-pay

## 2019-05-30 DIAGNOSIS — C61 Malignant neoplasm of prostate: Secondary | ICD-10-CM

## 2019-05-30 LAB — PSA: Prostatic Specific Antigen: <0.01 ng/mL (ref 0.00–4.00)

## 2019-06-02 ENCOUNTER — Other Ambulatory Visit: Payer: Self-pay | Admitting: *Deleted

## 2019-06-02 ENCOUNTER — Ambulatory Visit
Admission: RE | Admit: 2019-06-02 | Discharge: 2019-06-02 | Disposition: A | Discharge status: Home / Self Care | Payer: Medicare PPO | Source: Ambulatory Visit | Attending: Radiation Oncology | Admitting: Radiation Oncology

## 2019-06-02 ENCOUNTER — Encounter: Payer: Self-pay | Admitting: Radiation Oncology

## 2019-06-02 ENCOUNTER — Other Ambulatory Visit: Payer: Self-pay

## 2019-06-02 VITALS — BP 137/69 | HR 73 | Temp 96.8°F | Resp 20 | Wt 276.9 lb

## 2019-06-02 DIAGNOSIS — Z923 Personal history of irradiation: Secondary | ICD-10-CM | POA: Insufficient documentation

## 2019-06-02 DIAGNOSIS — C61 Malignant neoplasm of prostate: Secondary | ICD-10-CM | POA: Diagnosis present

## 2019-06-02 DIAGNOSIS — K5900 Constipation, unspecified: Secondary | ICD-10-CM | POA: Diagnosis not present

## 2019-06-02 NOTE — Progress Notes (Signed)
Radiation Oncology Follow up Note  Name: Leslie Prince   Date:   06/02/2019 MRN:  [id number] DOB: [birth year]    This 74 y.o. 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  REFERRING PROVIDER: [name], MD  HPI: Patient is a 74 year old male now at 10 months having completed radiation therapy to his prostate for stage IIb Gleason 8 disease.  Seen today in routine follow-up he is doing well.  He does have some intermittent constipation.  No specific increased lower urinary tract symptoms very little nocturia urgency or frequency.  His most recent PSA is less than 0.01.  Patient did have a CT scan of the chest showing an area of nodular density right lower lobe has cleared.  There are some small clusters of nodules laterally in the right posterior costophrenic sulcus for which 3 to 78-month follow-up has been recommended.  COMPLICATIONS OF TREATMENT: none  FOLLOW UP COMPLIANCE: keeps appointments   PHYSICAL EXAM:  BP 137/69   Pulse 73   Temp (!) 96.8 F (36 C) (Tympanic)   Resp 20   Wt 276 lb 14.4 oz (125.6 kg)   BMI 40.89 kg/m  Well-developed well-nourished patient in NAD. HEENT reveals PERLA, EOMI, discs not visualized.  Oral cavity is clear. No oral mucosal lesions are identified. Neck is clear without evidence of cervical or supraclavicular adenopathy. Lungs are clear to A&P. Cardiac examination is essentially unremarkable with regular rate and rhythm without murmur rub or thrill. Abdomen is benign with no organomegaly or masses noted. Motor sensory and DTR levels are equal and symmetric in the upper and lower extremities. Cranial nerves II through XII are grossly intact. Proprioception is intact. No peripheral adenopathy or edema is identified. No motor or sensory levels are noted. Crude visual fields are within normal range.  RADIOLOGY  RESULTS: CT scan of the chest reviewed compatible with above-stated findings  PLAN: Not that concerned about his CT scan although I will follow up with that when the next one becomes available.  Otherwise have asked to see him back in 6 months for follow-up and then will start once year follow-up visits.  Patient knows to call at anytime with any concerns.  I would like to take this opportunity to thank you for allowing me to participate in the care of your patient.Noreene Filbert, MD

## 2019-06-30 ENCOUNTER — Telehealth: Payer: Self-pay | Admitting: Urology

## 2019-07-29 NOTE — Anesthesia Preprocedure Evaluation (Addendum)
Anesthesia Evaluation  Patient identified by MRN, date of birth, ID band Patient awake    Reviewed: Allergy & Precautions, NPO status , Patient's Chart, lab work & pertinent test results, reviewed documented beta blocker date and time   History of Anesthesia Complications Negative for: history of anesthetic complications  Airway Mallampati: III  TM Distance: >3 FB Neck ROM: Full    Dental   Pulmonary sleep apnea , COPD, former smoker,    breath sounds clear to auscultation       Cardiovascular Exercise Tolerance: Poor hypertension, (-) angina+ CAD, + Cardiac Stents (2012), + CABG (1997), +CHF and + DOE (Stable)  + dysrhythmias Atrial Fibrillation  Rhythm:Regular Rate:Normal   HLD  TTE (11/2018): LVEF 50-55% LA mod dilated, RA mod dilated 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   Neuro/Psych    GI/Hepatic neg GERD  ,(+) Hepatitis -, A  Endo/Other    Renal/GU      Musculoskeletal   Abdominal   Peds  Hematology   Anesthesia Other Findings Gout  Reproductive/Obstetrics                          Anesthesia Physical Anesthesia Plan  ASA: III  Anesthesia Plan: MAC   Post-op Pain Management:    Induction: Intravenous  PONV Risk Score and Plan: 1 and TIVA, Midazolam and Treatment may vary due to age or medical condition  Airway Management Planned: Nasal Cannula  Additional Equipment:   Intra-op Plan:   Post-operative Plan:   Informed Consent: I have reviewed the patients History and Physical, chart, labs and discussed the procedure including the risks, benefits and alternatives for the proposed anesthesia with the patient or authorized representative who has indicated his/her understanding and acceptance.       Plan Discussed with: CRNA and Anesthesiologist  Anesthesia Plan Comments:         Anesthesia Quick Evaluation

## 2019-07-30 ENCOUNTER — Other Ambulatory Visit: Payer: Self-pay

## 2019-08-04 ENCOUNTER — Other Ambulatory Visit: Payer: Self-pay

## 2019-08-04 ENCOUNTER — Other Ambulatory Visit
Admission: RE | Admit: 2019-08-04 | Discharge: 2019-08-04 | Disposition: A | Discharge status: Home / Self Care | Payer: Medicare PPO | Source: Ambulatory Visit | Attending: Ophthalmology | Admitting: Ophthalmology

## 2019-08-04 DIAGNOSIS — Z01812 Encounter for preprocedural laboratory examination: Secondary | ICD-10-CM | POA: Diagnosis present

## 2019-08-04 DIAGNOSIS — Z20822 Contact with and (suspected) exposure to covid-19: Secondary | ICD-10-CM | POA: Diagnosis not present

## 2019-08-04 NOTE — Discharge Instructions (Signed)

## 2019-08-05 LAB — SARS CORONAVIRUS 2 (TAT 6-24 HRS): SARS Coronavirus 2: NEGATIVE

## 2019-08-06 ENCOUNTER — Ambulatory Visit: Payer: Medicare PPO | Admitting: Anesthesiology

## 2019-08-06 ENCOUNTER — Ambulatory Visit
Admission: RE | Admit: 2019-08-06 | Discharge: 2019-08-06 | Disposition: A | Discharge status: Home / Self Care | Payer: Medicare PPO | Attending: Ophthalmology | Admitting: Ophthalmology

## 2019-08-06 ENCOUNTER — Encounter
Admission: RE | Disposition: A | Discharge status: Home / Self Care | Payer: Self-pay | Source: Home / Self Care | Attending: Ophthalmology

## 2019-08-06 ENCOUNTER — Encounter: Payer: Self-pay | Admitting: Ophthalmology

## 2019-08-06 ENCOUNTER — Other Ambulatory Visit: Payer: Self-pay

## 2019-08-06 DIAGNOSIS — Z7982 Long term (current) use of aspirin: Secondary | ICD-10-CM | POA: Diagnosis not present

## 2019-08-06 DIAGNOSIS — M109 Gout, unspecified: Secondary | ICD-10-CM | POA: Insufficient documentation

## 2019-08-06 DIAGNOSIS — Z8546 Personal history of malignant neoplasm of prostate: Secondary | ICD-10-CM | POA: Insufficient documentation

## 2019-08-06 DIAGNOSIS — E785 Hyperlipidemia, unspecified: Secondary | ICD-10-CM | POA: Insufficient documentation

## 2019-08-06 DIAGNOSIS — Z7901 Long term (current) use of anticoagulants: Secondary | ICD-10-CM | POA: Insufficient documentation

## 2019-08-06 DIAGNOSIS — Z955 Presence of coronary angioplasty implant and graft: Secondary | ICD-10-CM | POA: Insufficient documentation

## 2019-08-06 DIAGNOSIS — Z87891 Personal history of nicotine dependence: Secondary | ICD-10-CM | POA: Insufficient documentation

## 2019-08-06 DIAGNOSIS — Z79899 Other long term (current) drug therapy: Secondary | ICD-10-CM | POA: Insufficient documentation

## 2019-08-06 DIAGNOSIS — Z923 Personal history of irradiation: Secondary | ICD-10-CM | POA: Diagnosis not present

## 2019-08-06 DIAGNOSIS — I110 Hypertensive heart disease with heart failure: Secondary | ICD-10-CM | POA: Insufficient documentation

## 2019-08-06 DIAGNOSIS — H2511 Age-related nuclear cataract, right eye: Secondary | ICD-10-CM | POA: Diagnosis present

## 2019-08-06 DIAGNOSIS — I4891 Unspecified atrial fibrillation: Secondary | ICD-10-CM | POA: Insufficient documentation

## 2019-08-06 DIAGNOSIS — Z8616 Personal history of COVID-19: Secondary | ICD-10-CM | POA: Diagnosis not present

## 2019-08-06 DIAGNOSIS — G4730 Sleep apnea, unspecified: Secondary | ICD-10-CM | POA: Diagnosis not present

## 2019-08-06 DIAGNOSIS — I2510 Atherosclerotic heart disease of native coronary artery without angina pectoris: Secondary | ICD-10-CM | POA: Insufficient documentation

## 2019-08-06 DIAGNOSIS — J449 Chronic obstructive pulmonary disease, unspecified: Secondary | ICD-10-CM | POA: Diagnosis not present

## 2019-08-06 DIAGNOSIS — E7800 Pure hypercholesterolemia, unspecified: Secondary | ICD-10-CM | POA: Diagnosis not present

## 2019-08-06 HISTORY — PX: CATARACT EXTRACTION W/PHACO: SHX586

## 2019-08-06 SURGERY — PHACOEMULSIFICATION, CATARACT, WITH IOL INSERTION
Anesthesia: Monitor Anesthesia Care | Site: Eye | Laterality: Right

## 2019-08-06 MED ORDER — MOXIFLOXACIN HCL 0.5 % OP SOLN
1.0000 [drp] | OPHTHALMIC | Status: DC | PRN
  Administered 2019-08-06 (×3): 1 [drp] via OPHTHALMIC

## 2019-08-06 MED ORDER — LACTATED RINGERS IV SOLN: 100.0000 mL/h | INTRAVENOUS | Status: DC

## 2019-08-06 MED ORDER — ARMC OPHTHALMIC DILATING DROPS
1.0000 "application " | OPHTHALMIC | Status: DC | PRN
  Administered 2019-08-06 (×3): 1 via OPHTHALMIC

## 2019-08-06 MED ORDER — LIDOCAINE HCL (PF) 2 % IJ SOLN
INTRAOCULAR | Status: DC | PRN
  Administered 2019-08-06: 1 mL

## 2019-08-06 MED ORDER — TETRACAINE HCL 0.5 % OP SOLN
1.0000 [drp] | OPHTHALMIC | Status: DC | PRN
  Administered 2019-08-06 (×3): 1 [drp] via OPHTHALMIC

## 2019-08-06 MED ORDER — EPINEPHRINE PF 1 MG/ML IJ SOLN
INTRAOCULAR | Status: DC | PRN
  Administered 2019-08-06: 75 mL via OPHTHALMIC

## 2019-08-06 MED ORDER — FENTANYL CITRATE (PF) 100 MCG/2ML IJ SOLN
INTRAMUSCULAR | Status: DC | PRN
  Administered 2019-08-06 (×2): 50 ug via INTRAVENOUS

## 2019-08-06 MED ORDER — BRIMONIDINE TARTRATE-TIMOLOL 0.2-0.5 % OP SOLN
OPHTHALMIC | Status: DC | PRN
  Administered 2019-08-06: 1 [drp] via OPHTHALMIC

## 2019-08-06 MED ORDER — NA HYALUR & NA CHOND-NA HYALUR 0.4-0.35 ML IO KIT
PACK | INTRAOCULAR | Status: DC | PRN
  Administered 2019-08-06: 1 mL via INTRAOCULAR

## 2019-08-06 MED ORDER — ACETAMINOPHEN 10 MG/ML IV SOLN: 1000.0000 mg | Freq: Once | INTRAVENOUS | Status: DC | PRN

## 2019-08-06 MED ORDER — CEFUROXIME OPHTHALMIC INJECTION 1 MG/0.1 ML
INJECTION | OPHTHALMIC | Status: DC | PRN
  Administered 2019-08-06: 0.1 mL via INTRACAMERAL

## 2019-08-06 MED ORDER — MIDAZOLAM HCL 2 MG/2ML IJ SOLN
INTRAMUSCULAR | Status: DC | PRN
  Administered 2019-08-06 (×2): 1 mg via INTRAVENOUS

## 2019-08-06 MED ORDER — ONDANSETRON HCL 4 MG/2ML IJ SOLN: 4.0000 mg | Freq: Once | INTRAMUSCULAR | Status: DC | PRN

## 2019-08-06 SURGICAL SUPPLY — 23 items
CANNULA ANT/CHMB 27G (MISCELLANEOUS) ×1 IMPLANT
CANNULA ANT/CHMB 27GA (MISCELLANEOUS) ×3 IMPLANT
GLOVE SURG LX 7.5 STRW (GLOVE) ×2
GLOVE SURG LX STRL 7.5 STRW (GLOVE) ×1 IMPLANT
GLOVE SURG TRIUMPH 8.0 PF LTX (GLOVE) ×3 IMPLANT
GOWN STRL REUS W/ TWL LRG LVL3 (GOWN DISPOSABLE) ×2 IMPLANT
GOWN STRL REUS W/TWL LRG LVL3 (GOWN DISPOSABLE) ×4
LENS IOL DIOP 19.0 (Intraocular Lens) ×3 IMPLANT
LENS IOL TECNIS MONO 19.0 (Intraocular Lens) IMPLANT
MARKER SKIN DUAL TIP RULER LAB (MISCELLANEOUS) ×3 IMPLANT
NDL CAPSULORHEX 25GA (NEEDLE) ×1 IMPLANT
NDL FILTER BLUNT 18X1 1/2 (NEEDLE) ×2 IMPLANT
NEEDLE CAPSULORHEX 25GA (NEEDLE) ×3 IMPLANT
NEEDLE FILTER BLUNT 18X 1/2SAF (NEEDLE) ×4
NEEDLE FILTER BLUNT 18X1 1/2 (NEEDLE) ×2 IMPLANT
PACK CATARACT BRASINGTON (MISCELLANEOUS) ×3 IMPLANT
PACK EYE AFTER SURG (MISCELLANEOUS) ×3 IMPLANT
PACK OPTHALMIC (MISCELLANEOUS) ×3 IMPLANT
SOLUTION OPHTHALMIC SALT (MISCELLANEOUS) ×3 IMPLANT
SYR 3ML LL SCALE MARK (SYRINGE) ×6 IMPLANT
SYR TB 1ML LUER SLIP (SYRINGE) ×3 IMPLANT
WATER STERILE IRR 250ML POUR (IV SOLUTION) ×3 IMPLANT
WIPE NON LINTING 3.25X3.25 (MISCELLANEOUS) ×3 IMPLANT

## 2019-08-06 NOTE — Anesthesia Procedure Notes (Signed)
Procedure Name: MAC Performed by: Izetta Dakin, CRNA Pre-anesthesia Checklist: Timeout performed, Patient being monitored, Suction available, Emergency Drugs available and Patient identified Patient Re-evaluated:Patient Re-evaluated prior to induction Oxygen Delivery Method: Nasal cannula

## 2019-08-06 NOTE — Anesthesia Postprocedure Evaluation (Signed)
Anesthesia Post Note  Patient: Leslie Prince  Procedure(s) Performed: CATARACT EXTRACTION PHACO AND INTRAOCULAR LENS PLACEMENT (IOC) RIGHT 12.34  01:25.4  14.5% (Right Eye)     Patient location during evaluation: PACU Anesthesia Type: MAC Level of consciousness: awake and alert Pain management: pain level controlled Vital Signs Assessment: post-procedure vital signs reviewed and stable Respiratory status: spontaneous breathing, nonlabored ventilation, respiratory function stable and patient connected to nasal cannula oxygen Cardiovascular status: stable and blood pressure returned to baseline Postop Assessment: no apparent nausea or vomiting Anesthetic complications: no    Allea Kassner A  Aahna Rossa

## 2019-08-06 NOTE — Transfer of Care (Signed)
Immediate Anesthesia Transfer of Care Note  Patient: Leslie Prince  Procedure(s) Performed: CATARACT EXTRACTION PHACO AND INTRAOCULAR LENS PLACEMENT (IOC) RIGHT 12.34  01:25.4  14.5% (Right Eye)  Patient Location: PACU  Anesthesia Type: MAC  Level of Consciousness: awake, alert  and patient cooperative  Airway and Oxygen Therapy: Patient Spontanous Breathing and Patient connected to supplemental oxygen  Post-op Assessment: Post-op Vital signs reviewed, Patient's Cardiovascular Status Stable, Respiratory Function Stable, Patent Airway and No signs of Nausea or vomiting  Post-op Vital Signs: Reviewed and stable  Complications: No apparent anesthesia complications

## 2019-08-06 NOTE — H&P (Signed)

## 2019-08-06 NOTE — Op Note (Signed)
LOCATION:  [place]   PREOPERATIVE DIAGNOSIS:    Nuclear sclerotic cataract right eye. H25.11   POSTOPERATIVE DIAGNOSIS:  Nuclear sclerotic cataract right eye.     PROCEDURE:  Phacoemusification with posterior chamber intraocular lens placement of the right eye   ULTRASOUND TIME: Procedure(s): CATARACT EXTRACTION PHACO AND INTRAOCULAR LENS PLACEMENT (IOC) RIGHT 12.34  01:25.4  14.5% (Right)  LENS:   Implant Name Type Inv. Item Serial No. Manufacturer Lot No. LRB No. Used Action  LENS IOL DIOP 19.0 - [id number] Intraocular Lens LENS IOL DIOP 19.0 [id number]  Right 1 Implanted         SURGEON:  Wyonia Hough, MD   ANESTHESIA:  Topical with tetracaine drops and 2% Xylocaine jelly, augmented with 1% preservative-free intracameral lidocaine.    COMPLICATIONS:  None.   DESCRIPTION OF PROCEDURE:  The patient was identified in the holding room and transported to the operating room and placed in the supine position under the operating microscope.  The right eye was identified as the operative eye and it was prepped and draped in the usual sterile ophthalmic fashion.   A 1 millimeter clear-corneal paracentesis was made at the 12:00 position.  0.5 ml of preservative-free 1% lidocaine was injected into the anterior chamber. The anterior chamber was filled with Viscoat viscoelastic.  A 2.4 millimeter keratome was used to make a near-clear corneal incision at the 9:00 position.  A curvilinear capsulorrhexis was made with a cystotome and capsulorrhexis forceps.  Balanced salt solution was used to hydrodissect and hydrodelineate the nucleus.   Phacoemulsification was then used in stop and chop fashion to remove the lens nucleus and epinucleus.  The remaining cortex was then removed using the irrigation and aspiration handpiece. Provisc was then placed into the capsular bag to distend it for lens placement.  A lens was then injected into the capsular bag.  The remaining  viscoelastic was aspirated.   Wounds were hydrated with balanced salt solution.  The anterior chamber was inflated to a physiologic pressure with balanced salt solution.  No wound leaks were noted. Cefuroxime 0.1 ml of a 10mg /ml solution was injected into the anterior chamber for a dose of 1 mg of intracameral antibiotic at the completion of the case.   Timolol and Brimonidine drops were applied to the eye.  The patient was taken to the recovery room in stable condition without complications of anesthesia or surgery.   Leslie Prince 08/06/2019, 9:30 AM

## 2019-08-07 ENCOUNTER — Encounter: Payer: Self-pay | Admitting: *Deleted

## 2019-08-07 NOTE — Progress Notes (Signed)
Left message

## 2019-08-19 ENCOUNTER — Encounter: Payer: Self-pay | Admitting: Ophthalmology

## 2019-08-19 ENCOUNTER — Other Ambulatory Visit: Payer: Self-pay

## 2019-08-20 ENCOUNTER — Other Ambulatory Visit: Payer: Self-pay | Admitting: Radiation Oncology

## 2019-08-25 ENCOUNTER — Other Ambulatory Visit
Admission: RE | Admit: 2019-08-25 | Discharge: 2019-08-25 | Disposition: A | Discharge status: Home / Self Care | Payer: Medicare PPO | Source: Ambulatory Visit | Attending: Ophthalmology | Admitting: Ophthalmology

## 2019-08-25 ENCOUNTER — Other Ambulatory Visit: Payer: Self-pay

## 2019-08-25 DIAGNOSIS — Z20822 Contact with and (suspected) exposure to covid-19: Secondary | ICD-10-CM | POA: Insufficient documentation

## 2019-08-25 DIAGNOSIS — Z01812 Encounter for preprocedural laboratory examination: Secondary | ICD-10-CM | POA: Diagnosis present

## 2019-08-25 LAB — SARS CORONAVIRUS 2 (TAT 6-24 HRS): SARS Coronavirus 2: NEGATIVE

## 2019-08-25 NOTE — Discharge Instructions (Signed)

## 2019-08-27 ENCOUNTER — Ambulatory Visit: Payer: Medicare PPO | Admitting: Anesthesiology

## 2019-08-27 ENCOUNTER — Encounter: Payer: Self-pay | Admitting: Ophthalmology

## 2019-08-27 ENCOUNTER — Ambulatory Visit
Admission: RE | Admit: 2019-08-27 | Discharge: 2019-08-27 | Disposition: A | Discharge status: Home / Self Care | Payer: Medicare PPO | Attending: Ophthalmology | Admitting: Ophthalmology

## 2019-08-27 ENCOUNTER — Encounter
Admission: RE | Disposition: A | Discharge status: Home / Self Care | Payer: Self-pay | Source: Home / Self Care | Attending: Ophthalmology

## 2019-08-27 ENCOUNTER — Other Ambulatory Visit: Payer: Self-pay

## 2019-08-27 DIAGNOSIS — Z955 Presence of coronary angioplasty implant and graft: Secondary | ICD-10-CM | POA: Insufficient documentation

## 2019-08-27 DIAGNOSIS — I4891 Unspecified atrial fibrillation: Secondary | ICD-10-CM | POA: Diagnosis not present

## 2019-08-27 DIAGNOSIS — Z87891 Personal history of nicotine dependence: Secondary | ICD-10-CM | POA: Insufficient documentation

## 2019-08-27 DIAGNOSIS — H2512 Age-related nuclear cataract, left eye: Secondary | ICD-10-CM | POA: Diagnosis present

## 2019-08-27 DIAGNOSIS — I509 Heart failure, unspecified: Secondary | ICD-10-CM | POA: Insufficient documentation

## 2019-08-27 DIAGNOSIS — G4730 Sleep apnea, unspecified: Secondary | ICD-10-CM | POA: Insufficient documentation

## 2019-08-27 DIAGNOSIS — J449 Chronic obstructive pulmonary disease, unspecified: Secondary | ICD-10-CM | POA: Insufficient documentation

## 2019-08-27 DIAGNOSIS — Z951 Presence of aortocoronary bypass graft: Secondary | ICD-10-CM | POA: Insufficient documentation

## 2019-08-27 DIAGNOSIS — I110 Hypertensive heart disease with heart failure: Secondary | ICD-10-CM | POA: Diagnosis not present

## 2019-08-27 DIAGNOSIS — Z79899 Other long term (current) drug therapy: Secondary | ICD-10-CM | POA: Insufficient documentation

## 2019-08-27 DIAGNOSIS — Z7982 Long term (current) use of aspirin: Secondary | ICD-10-CM | POA: Insufficient documentation

## 2019-08-27 HISTORY — PX: CATARACT EXTRACTION W/PHACO: SHX586

## 2019-08-27 HISTORY — DX: Unspecified atrial fibrillation: I48.91

## 2019-08-27 SURGERY — PHACOEMULSIFICATION, CATARACT, WITH IOL INSERTION
Anesthesia: Monitor Anesthesia Care | Site: Eye | Laterality: Left

## 2019-08-27 MED ORDER — LIDOCAINE HCL (PF) 2 % IJ SOLN
INTRAOCULAR | Status: DC | PRN
  Administered 2019-08-27: 2 mL

## 2019-08-27 MED ORDER — MOXIFLOXACIN HCL 0.5 % OP SOLN
1.0000 [drp] | OPHTHALMIC | Status: DC | PRN
  Administered 2019-08-27 (×3): 1 [drp] via OPHTHALMIC

## 2019-08-27 MED ORDER — NA HYALUR & NA CHOND-NA HYALUR 0.4-0.35 ML IO KIT
PACK | INTRAOCULAR | Status: DC | PRN
  Administered 2019-08-27: 1 mL via INTRAOCULAR

## 2019-08-27 MED ORDER — FENTANYL CITRATE (PF) 100 MCG/2ML IJ SOLN
INTRAMUSCULAR | Status: DC | PRN
  Administered 2019-08-27: 50 ug via INTRAVENOUS

## 2019-08-27 MED ORDER — ARMC OPHTHALMIC DILATING DROPS
1.0000 "application " | OPHTHALMIC | Status: DC | PRN
  Administered 2019-08-27 (×3): 1 via OPHTHALMIC

## 2019-08-27 MED ORDER — CEFUROXIME OPHTHALMIC INJECTION 1 MG/0.1 ML
INJECTION | OPHTHALMIC | Status: DC | PRN
  Administered 2019-08-27: 0.1 mL via INTRACAMERAL

## 2019-08-27 MED ORDER — BRIMONIDINE TARTRATE-TIMOLOL 0.2-0.5 % OP SOLN
OPHTHALMIC | Status: DC | PRN
  Administered 2019-08-27: 1 [drp] via OPHTHALMIC

## 2019-08-27 MED ORDER — MIDAZOLAM HCL 2 MG/2ML IJ SOLN
INTRAMUSCULAR | Status: DC | PRN
  Administered 2019-08-27: 1 mg via INTRAVENOUS

## 2019-08-27 MED ORDER — EPINEPHRINE PF 1 MG/ML IJ SOLN
INTRAOCULAR | Status: DC | PRN
  Administered 2019-08-27: 82 mL via OPHTHALMIC

## 2019-08-27 MED ORDER — TETRACAINE HCL 0.5 % OP SOLN
1.0000 [drp] | OPHTHALMIC | Status: DC | PRN
  Administered 2019-08-27 (×2): 1 [drp] via OPHTHALMIC

## 2019-08-27 SURGICAL SUPPLY — 23 items
CANNULA ANT/CHMB 27G (MISCELLANEOUS) ×1 IMPLANT
CANNULA ANT/CHMB 27GA (MISCELLANEOUS) ×3 IMPLANT
GLOVE SURG LX 7.5 STRW (GLOVE) ×2
GLOVE SURG LX STRL 7.5 STRW (GLOVE) ×1 IMPLANT
GLOVE SURG TRIUMPH 8.0 PF LTX (GLOVE) ×3 IMPLANT
GOWN STRL REUS W/ TWL LRG LVL3 (GOWN DISPOSABLE) ×2 IMPLANT
GOWN STRL REUS W/TWL LRG LVL3 (GOWN DISPOSABLE) ×4
LENS IOL DIOP 18.5 (Intraocular Lens) ×3 IMPLANT
LENS IOL TECNIS MONO 18.5 (Intraocular Lens) IMPLANT
MARKER SKIN DUAL TIP RULER LAB (MISCELLANEOUS) ×3 IMPLANT
NDL CAPSULORHEX 25GA (NEEDLE) ×1 IMPLANT
NDL FILTER BLUNT 18X1 1/2 (NEEDLE) ×2 IMPLANT
NEEDLE CAPSULORHEX 25GA (NEEDLE) ×3 IMPLANT
NEEDLE FILTER BLUNT 18X 1/2SAF (NEEDLE) ×4
NEEDLE FILTER BLUNT 18X1 1/2 (NEEDLE) ×2 IMPLANT
PACK CATARACT BRASINGTON (MISCELLANEOUS) ×3 IMPLANT
PACK EYE AFTER SURG (MISCELLANEOUS) ×3 IMPLANT
PACK OPTHALMIC (MISCELLANEOUS) ×3 IMPLANT
SOLUTION OPHTHALMIC SALT (MISCELLANEOUS) ×3 IMPLANT
SYR 3ML LL SCALE MARK (SYRINGE) ×6 IMPLANT
SYR TB 1ML LUER SLIP (SYRINGE) ×3 IMPLANT
WATER STERILE IRR 250ML POUR (IV SOLUTION) ×3 IMPLANT
WIPE NON LINTING 3.25X3.25 (MISCELLANEOUS) ×3 IMPLANT

## 2019-08-27 NOTE — Transfer of Care (Signed)
Immediate Anesthesia Transfer of Care Note  Patient: Leslie Prince  Procedure(s) Performed: CATARACT EXTRACTION PHACO AND INTRAOCULAR LENS PLACEMENT (IOC) LEFT 7.92 01:16.9 10.3% (Left Eye)  Patient Location: PACU  Anesthesia Type: MAC  Level of Consciousness: awake, alert  and patient cooperative  Airway and Oxygen Therapy: Patient Spontanous Breathing   Post-op Assessment: Post-op Vital signs reviewed, Patient's Cardiovascular Status Stable, Respiratory Function Stable, Patent Airway and No signs of Nausea or vomiting  Post-op Vital Signs: Reviewed and stable  Complications: No apparent anesthesia complications

## 2019-08-27 NOTE — Op Note (Signed)
OPERATIVE NOTE  Leslie Prince [id number] 08/27/2019   PREOPERATIVE DIAGNOSIS:  Nuclear sclerotic cataract left eye. H25.12   POSTOPERATIVE DIAGNOSIS:    Nuclear sclerotic cataract left eye.     PROCEDURE:  Phacoemusification with posterior chamber intraocular lens placement of the left eye  Ultrasound time: Procedure(s) with comments: CATARACT EXTRACTION PHACO AND INTRAOCULAR LENS PLACEMENT (IOC) LEFT 7.92 01:16.9 10.3% (Left) - sleep apnea  LENS:   Implant Name Type Inv. Item Serial No. Manufacturer Lot No. LRB No. Used Action  LENS IOL DIOP 18.5 - [id number] Intraocular Lens LENS IOL DIOP 18.5 [id number]  Left 1 Implanted      SURGEON:  Wyonia Hough, MD   ANESTHESIA:  Topical with tetracaine drops and 2% Xylocaine jelly, augmented with 1% preservative-free intracameral lidocaine.    COMPLICATIONS:  None.   DESCRIPTION OF PROCEDURE:  The patient was identified in the holding room and transported to the operating room and placed in the supine position under the operating microscope.  The left eye was identified as the operative eye and it was prepped and draped in the usual sterile ophthalmic fashion.   A 1 millimeter clear-corneal paracentesis was made at the 1:30 position.  0.5 ml of preservative-free 1% lidocaine was injected into the anterior chamber.  The anterior chamber was filled with Viscoat viscoelastic.  A 2.4 millimeter keratome was used to make a near-clear corneal incision at the 10:30 position.  .  A curvilinear capsulorrhexis was made with a cystotome and capsulorrhexis forceps.  Balanced salt solution was used to hydrodissect and hydrodelineate the nucleus.   Phacoemulsification was then used in stop and chop fashion to remove the lens nucleus and epinucleus.  The remaining cortex was then removed using the irrigation and aspiration handpiece. Provisc was then placed into the capsular bag to distend it for lens placement.  A lens was then injected into  the capsular bag.  The remaining viscoelastic was aspirated.   Wounds were hydrated with balanced salt solution.  The anterior chamber was inflated to a physiologic pressure with balanced salt solution.  No wound leaks were noted. Cefuroxime 0.1 ml of a 10mg /ml solution was injected into the anterior chamber for a dose of 1 mg of intracameral antibiotic at the completion of the case.   Timolol and Brimonidine drops were applied to the eye.  The patient was taken to the recovery room in stable condition without complications of anesthesia or surgery.  [name] 08/27/2019, 12:28 PM

## 2019-08-27 NOTE — Anesthesia Postprocedure Evaluation (Signed)
Anesthesia Post Note  Patient: Leslie Prince  Procedure(s) Performed: CATARACT EXTRACTION PHACO AND INTRAOCULAR LENS PLACEMENT (IOC) LEFT 7.92 01:16.9 10.3% (Left Eye)     Patient location during evaluation: PACU Anesthesia Type: MAC Level of consciousness: awake and alert Pain management: pain level controlled Vital Signs Assessment: post-procedure vital signs reviewed and stable Respiratory status: spontaneous breathing Cardiovascular status: blood pressure returned to baseline Postop Assessment: no apparent nausea or vomiting, adequate PO intake and no headache Anesthetic complications: no    Adele Barthel Page

## 2019-08-27 NOTE — Anesthesia Procedure Notes (Signed)
Procedure Name: MAC Date/Time: 08/27/2019 12:12 PM Performed by: Vanetta Shawl, CRNA Pre-anesthesia Checklist: Patient identified, Emergency Drugs available, Suction available, Timeout performed and Patient being monitored Patient Re-evaluated:Patient Re-evaluated prior to induction Oxygen Delivery Method: Nasal cannula Placement Confirmation: positive ETCO2

## 2019-08-27 NOTE — H&P (Signed)

## 2019-08-27 NOTE — Anesthesia Preprocedure Evaluation (Signed)
Anesthesia Evaluation  Patient identified by MRN, date of birth, ID band Patient awake    History of Anesthesia Complications Negative for: history of anesthetic complications  Airway Mallampati: II  TM Distance: >3 FB Neck ROM: Full    Dental   Pulmonary sleep apnea , COPD, former smoker,    breath sounds clear to auscultation       Cardiovascular Exercise Tolerance: Poor hypertension, (-) angina+ CAD, + Cardiac Stents (2012), + CABG (1997) and + DOE (Stable)  + dysrhythmias Atrial Fibrillation + Valvular Problems/Murmurs (mitral valve repair)  Rhythm:Regular Rate:Normal   HLD  TTE (11/2018): LVEF 50-55% LA mod dilated, RA mod dilated 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   Neuro/Psych    GI/Hepatic neg GERD  ,  Endo/Other    Renal/GU      Musculoskeletal   Abdominal   Peds  Hematology   Anesthesia Other Findings Gout  Reproductive/Obstetrics                             Anesthesia Physical  Anesthesia Plan  ASA: III  Anesthesia Plan: MAC   Post-op Pain Management:    Induction: Intravenous  PONV Risk Score and Plan: 1 and TIVA, Midazolam and Treatment may vary due to age or medical condition  Airway Management Planned: Nasal Cannula  Additional Equipment:   Intra-op Plan:   Post-operative Plan:   Informed Consent: I have reviewed the patients History and Physical, chart, labs and discussed the procedure including the risks, benefits and alternatives for the proposed anesthesia with the patient or authorized representative who has indicated his/her understanding and acceptance.       Plan Discussed with: CRNA and Anesthesiologist  Anesthesia Plan Comments:         Anesthesia Quick Evaluation

## 2019-08-28 ENCOUNTER — Encounter: Payer: Self-pay | Admitting: *Deleted

## 2019-09-24 ENCOUNTER — Ambulatory Visit: Payer: Medicare Other

## 2019-10-07 ENCOUNTER — Other Ambulatory Visit: Payer: Self-pay | Admitting: Pulmonary Disease

## 2019-10-07 DIAGNOSIS — N402 Nodular prostate without lower urinary tract symptoms: Secondary | ICD-10-CM

## 2019-10-07 DIAGNOSIS — J42 Unspecified chronic bronchitis: Secondary | ICD-10-CM

## 2019-10-07 DIAGNOSIS — R911 Solitary pulmonary nodule: Secondary | ICD-10-CM

## 2019-10-16 ENCOUNTER — Ambulatory Visit
Admission: RE | Admit: 2019-10-16 | Discharge: 2019-10-16 | Disposition: A | Discharge status: Home / Self Care | Payer: Medicare PPO | Source: Ambulatory Visit | Attending: Pulmonary Disease | Admitting: Pulmonary Disease

## 2019-10-16 ENCOUNTER — Other Ambulatory Visit: Payer: Self-pay

## 2019-10-16 DIAGNOSIS — J42 Unspecified chronic bronchitis: Secondary | ICD-10-CM | POA: Diagnosis present

## 2019-10-16 DIAGNOSIS — N402 Nodular prostate without lower urinary tract symptoms: Secondary | ICD-10-CM | POA: Insufficient documentation

## 2019-10-16 DIAGNOSIS — R911 Solitary pulmonary nodule: Secondary | ICD-10-CM | POA: Insufficient documentation

## 2019-10-17 ENCOUNTER — Encounter: Payer: Self-pay | Admitting: Cardiology

## 2019-10-17 ENCOUNTER — Ambulatory Visit: Payer: Medicare PPO | Admitting: Cardiology

## 2019-10-17 VITALS — BP 150/78 | HR 66 | Ht 69.0 in | Wt 286.5 lb

## 2019-10-17 DIAGNOSIS — I4821 Permanent atrial fibrillation: Secondary | ICD-10-CM | POA: Diagnosis not present

## 2019-10-17 DIAGNOSIS — I10 Essential (primary) hypertension: Secondary | ICD-10-CM | POA: Diagnosis not present

## 2019-10-17 DIAGNOSIS — Z951 Presence of aortocoronary bypass graft: Secondary | ICD-10-CM

## 2019-10-17 NOTE — Progress Notes (Signed)
Cardiology Office Note:    Date:  10/17/2019   ID:  Leslie Prince, DOB September 26, 1945, MRN [id number]  PCP:  Leslie Pitch, MD  [facility] Cardiologist:  Leslie Sable, MD  [place] Electrophysiologist:  None   Referring MD: Leslie Pitch, MD   Chief Complaint  Patient presents with  . New Patient (Initial Visit)    Self ref to establish care for A-Fib & CAD. Meds reviewed by the pt. verbally. Pt. c/o LE edema & shortness of breath at times.     History of Present Illness:    Leslie Prince is a 74 y.o. male with a hx of chronic atrial fibrillation on Pradaxa, CAD PCI w/ DES to LCx 2011, CABG x 3 in 1997, mitral valve repair 1997, hypertension, former smoker, COPD who presents to establish care due to history of atrial fibrillation.  Patient was previously followed by [facility] and he wants to switch care.  He denies any new symptoms.  States having shortness of breath chronic due to COPD.  Also has OSA and uses his CPAP mask as prescribed.  Denies any chest pain or palpitations.  Echocardiogram on 11/17/2018 showed low normal systolic function, EF 50 to 55%.  Myoview on 02/19/2017 showed normal wall motion, mild apical scar/artifact without any evidence for ischemia.  Past Medical History:  Diagnosis Date  . Atrial fibrillation ([place])   . Cancer [facility])    prostate cancer  . CHF (congestive heart failure) ([place])   . COPD (chronic obstructive pulmonary disease) ([place])   . Coronary artery disease   . Hypertension   . Sleep apnea     Past Surgical History:  Procedure Laterality Date  . CARDIAC SURGERY    . CARDIOVERSION  2013  . CATARACT EXTRACTION W/PHACO Right 08/06/2019   Procedure: CATARACT EXTRACTION PHACO AND INTRAOCULAR LENS PLACEMENT (IOC) RIGHT 12.34  01:25.4  14.5%;  Surgeon: Leslie Koyanagi, MD;  Location: [place];  Service: Ophthalmology;  Laterality: Right;  . CATARACT EXTRACTION W/PHACO Left 08/27/2019   Procedure: CATARACT EXTRACTION PHACO AND  INTRAOCULAR LENS PLACEMENT (IOC) LEFT 7.92 01:16.9 10.3%;  Surgeon: Leslie Koyanagi, MD;  Location: [place];  Service: Ophthalmology;  Laterality: Left;  sleep apnea  . COLONOSCOPY WITH PROPOFOL N/A 11/07/2017   Procedure: COLONOSCOPY WITH PROPOFOL;  Surgeon: Manya Silvas, MD;  Location: [facility];  Service: Endoscopy;  Laterality: N/A;  . CORONARY ANGIOPLASTY WITH STENT PLACEMENT  2011  . MITRAL VALVE REPAIR  1997    Current Medications: Current Meds  Medication Sig  . albuterol (PROAIR HFA) 108 (90 Base) MCG/ACT inhaler Inhale 2 puffs into the lungs every 6 (six) hours as needed for wheezing.   Marland Kitchen albuterol (PROVENTIL) (2.5 MG/3ML) 0.083% nebulizer solution Take 2.5 mg by nebulization every 6 (six) hours as needed for wheezing.  Marland Kitchen allopurinol (ZYLOPRIM) 100 MG tablet Take 100 mg by mouth daily.  Marland Kitchen aspirin EC 81 MG tablet Take 81 mg by mouth daily.   Marland Kitchen atorvastatin (LIPITOR) 20 MG tablet Take 20 mg by mouth daily.  . Calcium Carb-Cholecalciferol (CALCIUM 600 + D PO) Take by mouth 2 (two) times daily.  . colchicine 0.6 MG tablet Take 0.6 mg by mouth as directed. Take 2 tablets at onset of gout flare, then 1 tablet one hour later  . dabigatran (PRADAXA) 150 MG CAPS capsule Take 150 mg by mouth 2 (two) times daily.   . enalapril (VASOTEC) 20 MG tablet Take 20 mg by mouth 2 (two) times daily.   Marland Kitchen  fluticasone (FLONASE) 50 MCG/ACT nasal spray Place into both nostrils daily.  . Fluticasone-Salmeterol (ADVAIR DISKUS) 250-50 MCG/DOSE AEPB Inhale 1 puff into the lungs 2 (two) times daily.   . furosemide (LASIX) 40 MG tablet Take 40 mg by mouth daily.   Marland Kitchen KLOR-CON M10 10 MEQ tablet Take 20 mEq by mouth daily.   . metolazone (ZAROXOLYN) 5 MG tablet Take 5 mg by mouth as directed.   . metoprolol succinate (TOPROL-XL) 100 MG 24 hr tablet Take 100 mg by mouth daily.  . Misc Natural Products (TART CHERRY ADVANCED PO) Take by mouth.  . montelukast (SINGULAIR) 10 MG tablet TAKE 1  TABLET (10 MG TOTAL) BY MOUTH NIGHTLY.  . Multiple Vitamins tablet Take 1 tablet by mouth daily.   . tamsulosin (FLOMAX) 0.4 MG CAPS capsule TAKE 1 CAPSULE (0.4 MG TOTAL) BY MOUTH DAILY AFTER SUPPER.  Marland Kitchen tiotropium (SPIRIVA) 18 MCG inhalation capsule Place 18 mcg into inhaler and inhale daily.   Current Facility-Administered Medications for the 10/17/19 encounter (Office Visit) with Leslie Sable, MD  Medication  . gentamicin (GARAMYCIN) injection 80 mg     Allergies:   Lisinopril   Social History   Socioeconomic History  . Marital status: Married    Spouse name: Not on file  . Number of children: Not on file  . Years of education: Not on file  . Highest education level: Not on file  Occupational History  . Not on file  Tobacco Use  . Smoking status: Former Smoker    Packs/day: 1.00    Years: 25.00    Pack years: 25.00    Types: Cigarettes    Quit date: 06/21/1995    Years since quitting: 24.3  . Smokeless tobacco: Never Used  Vaping Use  . Vaping Use: Never used  Substance and Sexual Activity  . Alcohol use: Yes    Comment: Rare  . Drug use: Never  . Sexual activity: Not on file  Other Topics Concern  . Not on file  Social History Narrative  . Not on file   Social Determinants of Health   Financial Resource Strain:   . Difficulty of Paying Living Expenses:   Food Insecurity:   . Worried About Charity fundraiser in the Last Year:   . Arboriculturist in the Last Year:   Transportation Needs:   . Film/video editor (Medical):   Marland Kitchen Lack of Transportation (Non-Medical):   Physical Activity:   . Days of Exercise per Week:   . Minutes of Exercise per Session:   Stress:   . Feeling of Stress :   Social Connections:   . Frequency of Communication with Friends and Family:   . Frequency of Social Gatherings with Friends and Family:   . Attends Religious Services:   . Active Member of Clubs or Organizations:   . Attends Archivist Meetings:   Marland Kitchen  Marital Status:      Family History: The patient's family history includes Hypertension in his mother; Valvular heart disease in his father.  ROS:   Please see the history of present illness.     All other systems reviewed and are negative.  EKGs/Labs/Other Studies Reviewed:    The following studies were reviewed today:   EKG:  EKG is  ordered today.  The ekg ordered today demonstrates atrial fibrillation, heart rate 66  Recent Labs: 11/15/2018: ALT 15; B Natriuretic Peptide 379.0 11/16/2018: BUN 17; Creatinine, Ser 0.75; Hemoglobin 10.5; Magnesium 2.1; Platelets  103; Potassium 3.4; Sodium 138  Recent Lipid Panel No results found for: CHOL, TRIG, HDL, CHOLHDL, VLDL, LDLCALC, LDLDIRECT  Physical Exam:    VS:  BP (!) 150/78 (BP Location: Right Arm, Patient Position: Sitting, Cuff Size: Large)   Pulse 66   Ht 5\' 9"  (1.753 m)   Wt 286 lb 8 oz (130 kg)   SpO2 97%   BMI 42.31 kg/m     Wt Readings from Last 3 Encounters:  10/17/19 286 lb 8 oz (130 kg)  08/27/19 284 lb (128.8 kg)  08/06/19 279 lb (126.6 kg)     GEN:  Well nourished, well developed in no acute distress HEENT: Normal NECK: No JVD; No carotid bruits LYMPHATICS: No lymphadenopathy CARDIAC: Irregular irregular, no murmurs, rubs, gallops RESPIRATORY:  Clear to auscultation without rales, wheezing or rhonchi  ABDOMEN: Soft, non-tender, non-distended MUSCULOSKELETAL:  No edema; No deformity  SKIN: Warm and dry NEUROLOGIC:  Alert and oriented x 3 PSYCHIATRIC:  Normal affect   ASSESSMENT:    1. Hx of CABG   2. Permanent atrial fibrillation ([place])   3. Essential hypertension    PLAN:    In order of problems listed above:  1. Patient with history of CAD/CABG, PCI to the left circumflex.  Denies any anginal symptoms.  Last echocardiogram showed low normal EF, Myoview in 2018 with no evidence for ischemia.  Repeat echo to evaluate systolic function.  Continue statin, beta-blocker, Pradaxa as prescribed. 2. History  of permanent atrial fibrillation, heart rate controlled.  Continue Toprol-XL and Pradaxa. 3. History of hypertension, BP elevated today.  Pressure typically normal at home.  Home BP log recommended.  Consider up titration of medication if BP elevated at follow-up visit.  Follow-up after echo   Medication Adjustments/Labs and Tests Ordered: Current medicines are reviewed at length with the patient today.  Concerns regarding medicines are outlined above.  Orders Placed This Encounter  Procedures  . EKG 12-Lead  . ECHOCARDIOGRAM COMPLETE   No orders of the defined types were placed in this encounter.   Patient Instructions  Medication Instructions:   No changes.  *If you need a refill on your cardiac medications before your next appointment, please call your pharmacy*   Lab Work: None Ordered. If you have labs (blood work) drawn today and your tests are completely normal, you will receive your results only by: Marland Kitchen MyChart Message (if you have MyChart) OR . A paper copy in the mail If you have any lab test that is abnormal or we need to change your treatment, we will call you to review the results.   Testing/Procedures:  Your physician has requested that you have an echocardiogram. Echocardiography is a painless test that uses sound waves to create images of your heart. It provides your doctor with information about the size and shape of your heart and how well your heart's chambers and valves are working. This procedure takes approximately one hour. There are no restrictions for this procedure.    Follow-Up: At [facility], you and your health needs are our priority.  As part of our continuing mission to provide you with exceptional heart care, we have created designated Provider Care Teams.  These Care Teams include your primary Cardiologist (physician) and Advanced Practice Providers (APPs -  Physician Assistants and Nurse Practitioners) who all work together to provide you  with the care you need, when you need it.  We recommend signing up for the patient portal called "MyChart".  Sign up information  is provided on this After Visit Summary.  MyChart is used to connect with patients for Virtual Visits (Telemedicine).  Patients are able to view lab/test results, encounter notes, upcoming appointments, etc.  Non-urgent messages can be sent to your provider as well.   To learn more about what you can do with MyChart, go to [email or web address].    Your next appointment:   Follow up after echo   The format for your next appointment:   In Person  Provider:   Kate Sable, MD   Other Instructions   Echocardiogram An echocardiogram is a procedure that uses painless sound waves (ultrasound) to produce an image of the heart. Images from an echocardiogram can provide important information about:  Signs of coronary artery disease (CAD).  Aneurysm detection. An aneurysm is a weak or damaged part of an artery wall that bulges out from the normal force of blood pumping through the body.  Heart size and shape. Changes in the size or shape of the heart can be associated with certain conditions, including heart failure, aneurysm, and CAD.  Heart muscle function.  Heart valve function.  Signs of a past heart attack.  Fluid buildup around the heart.  Thickening of the heart muscle.  A tumor or infectious growth around the heart valves. Tell a health care provider about:  Any allergies you have.  All medicines you are taking, including vitamins, herbs, eye drops, creams, and over-the-counter medicines.  Any blood disorders you have.  Any surgeries you have had.  Any medical conditions you have.  Whether you are pregnant or may be pregnant. What are the risks? Generally, this is a safe procedure. However, problems may occur, including:  Allergic reaction to dye (contrast) that may be used during the procedure. What happens before the  procedure? No specific preparation is needed. You may eat and drink normally. What happens during the procedure?   An IV tube may be inserted into one of your veins.  You may receive contrast through this tube. A contrast is an injection that improves the quality of the pictures from your heart.  A gel will be applied to your chest.  A wand-like tool (transducer) will be moved over your chest. The gel will help to transmit the sound waves from the transducer.  The sound waves will harmlessly bounce off of your heart to allow the heart images to be captured in real-time motion. The images will be recorded on a computer. The procedure may vary among health care providers and hospitals. What happens after the procedure?  You may return to your normal, everyday life, including diet, activities, and medicines, unless your health care provider tells you not to do that. Summary  An echocardiogram is a procedure that uses painless sound waves (ultrasound) to produce an image of the heart.  Images from an echocardiogram can provide important information about the size and shape of your heart, heart muscle function, heart valve function, and fluid buildup around your heart.  You do not need to do anything to prepare before this procedure. You may eat and drink normally.  After the echocardiogram is completed, you may return to your normal, everyday life, unless your health care provider tells you not to do that. This information is not intended to replace advice given to you by your health care provider. Make sure you discuss any questions you have with your health care provider. Document Revised: 07/18/2018 Document Reviewed: 04/29/2016 Elsevier Patient Education  [place].  Signed, Leslie Sable, MD  10/17/2019 3:38 PM    [facility]

## 2019-10-17 NOTE — Patient Instructions (Signed)
Medication Instructions:   No changes.  *If you need a refill on your cardiac medications before your next appointment, please call your pharmacy*   Lab Work: None Ordered. If you have labs (blood work) drawn today and your tests are completely normal, you will receive your results only by: Marland Kitchen MyChart Message (if you have MyChart) OR . A paper copy in the mail If you have any lab test that is abnormal or we need to change your treatment, we will call you to review the results.   Testing/Procedures:  Your physician has requested that you have an echocardiogram. Echocardiography is a painless test that uses sound waves to create images of your heart. It provides your doctor with information about the size and shape of your heart and how well your heart's chambers and valves are working. This procedure takes approximately one hour. There are no restrictions for this procedure.    Follow-Up: At [facility], you and your health needs are our priority.  As part of our continuing mission to provide you with exceptional heart care, we have created designated Provider Care Teams.  These Care Teams include your primary Cardiologist (physician) and Advanced Practice Providers (APPs -  Physician Assistants and Nurse Practitioners) who all work together to provide you with the care you need, when you need it.  We recommend signing up for the patient portal called "MyChart".  Sign up information is provided on this After Visit Summary.  MyChart is used to connect with patients for Virtual Visits (Telemedicine).  Patients are able to view lab/test results, encounter notes, upcoming appointments, etc.  Non-urgent messages can be sent to your provider as well.   To learn more about what you can do with MyChart, go to [email or web address].    Your next appointment:   Follow up after echo   The format for your next appointment:   In Person  Provider:   Kate Sable, MD   Other  Instructions   Echocardiogram An echocardiogram is a procedure that uses painless sound waves (ultrasound) to produce an image of the heart. Images from an echocardiogram can provide important information about:  Signs of coronary artery disease (CAD).  Aneurysm detection. An aneurysm is a weak or damaged part of an artery wall that bulges out from the normal force of blood pumping through the body.  Heart size and shape. Changes in the size or shape of the heart can be associated with certain conditions, including heart failure, aneurysm, and CAD.  Heart muscle function.  Heart valve function.  Signs of a past heart attack.  Fluid buildup around the heart.  Thickening of the heart muscle.  A tumor or infectious growth around the heart valves. Tell a health care provider about:  Any allergies you have.  All medicines you are taking, including vitamins, herbs, eye drops, creams, and over-the-counter medicines.  Any blood disorders you have.  Any surgeries you have had.  Any medical conditions you have.  Whether you are pregnant or may be pregnant. What are the risks? Generally, this is a safe procedure. However, problems may occur, including:  Allergic reaction to dye (contrast) that may be used during the procedure. What happens before the procedure? No specific preparation is needed. You may eat and drink normally. What happens during the procedure?   An IV tube may be inserted into one of your veins.  You may receive contrast through this tube. A contrast is an injection that improves the quality of the pictures  from your heart.  A gel will be applied to your chest.  A wand-like tool (transducer) will be moved over your chest. The gel will help to transmit the sound waves from the transducer.  The sound waves will harmlessly bounce off of your heart to allow the heart images to be captured in real-time motion. The images will be recorded on a computer. The  procedure may vary among health care providers and hospitals. What happens after the procedure?  You may return to your normal, everyday life, including diet, activities, and medicines, unless your health care provider tells you not to do that. Summary  An echocardiogram is a procedure that uses painless sound waves (ultrasound) to produce an image of the heart.  Images from an echocardiogram can provide important information about the size and shape of your heart, heart muscle function, heart valve function, and fluid buildup around your heart.  You do not need to do anything to prepare before this procedure. You may eat and drink normally.  After the echocardiogram is completed, you may return to your normal, everyday life, unless your health care provider tells you not to do that. This information is not intended to replace advice given to you by your health care provider. Make sure you discuss any questions you have with your health care provider. Document Revised: 07/18/2018 Document Reviewed: 04/29/2016 Elsevier Patient Education  [place].

## 2019-10-20 ENCOUNTER — Other Ambulatory Visit: Payer: Self-pay

## 2019-10-20 ENCOUNTER — Ambulatory Visit: Payer: Medicare PPO | Admitting: Physician Assistant

## 2019-10-20 DIAGNOSIS — I714 Abdominal aortic aneurysm, without rupture, unspecified: Secondary | ICD-10-CM

## 2019-10-20 NOTE — Progress Notes (Signed)
Put referral in as requested by Dr. Garen Lah as stated below, also sent patient a mychart message informing him of this.  Regarding CT scan, patient should definitely follow-up with Dr. Sanjuana Mae since he ordered the scan.   As per the results, we already know he has coronary artery disease and CABG so that is not new.   As to the infrarenal abdominal aortic aneurysm, recommend vascular surgery referral for further input. This will likely be done after he follows up with pulmonary medicine.   Keep follow-up appointment as scheduled with myself. Thank you

## 2019-10-20 NOTE — Telephone Encounter (Signed)
Sent patient a Pharmacist, community message and put a referral in for vascular surgery.

## 2019-10-21 NOTE — Telephone Encounter (Signed)
1027- Left a VM for patient per DPR regarding Dr. Thereasa Solo response. Advised patient to call back if he had any further questions or concerns.

## 2019-10-22 ENCOUNTER — Other Ambulatory Visit: Payer: Self-pay

## 2019-10-22 ENCOUNTER — Ambulatory Visit: Payer: Medicare PPO | Admitting: Physician Assistant

## 2019-10-22 ENCOUNTER — Encounter: Payer: Self-pay | Admitting: Physician Assistant

## 2019-10-22 VITALS — BP 147/76 | HR 80 | Ht 69.0 in | Wt 286.0 lb

## 2019-10-22 DIAGNOSIS — C61 Malignant neoplasm of prostate: Secondary | ICD-10-CM | POA: Diagnosis not present

## 2019-10-22 MED ORDER — LEUPROLIDE ACETATE (6 MONTH) 45 MG ~~LOC~~ KIT
45.0000 mg | PACK | Freq: Once | SUBCUTANEOUS | Status: AC
  Administered 2019-10-22: 45 mg via SUBCUTANEOUS

## 2019-10-22 NOTE — Progress Notes (Signed)
10/22/2019 3:28 PM   Leslie Prince [birth year] [id number]  CC: Chief Complaint  Patient presents with  . Prostate Cancer    18mo    HPI: Leslie Prince is a 74 y.o. male with PMH Gleason 4+4 prostate cancer on ADT s/p IMRT who presents today for Eligard.  Today represents his fourth ADT injection.  Per note by Dr. Diamantina Providence on 03/27/2018, plan was to proceed with 2 years total of ADT.  Patient continues to take calcium and vitamin D supplements as previously instructed.  He denies fatigue or hot flashes.  No interval fractures.  No acute concerns.  PMH: Past Medical History:  Diagnosis Date  . Atrial fibrillation ([place])   . Cancer [facility])    prostate cancer  . CHF (congestive heart failure) ([place])   . COPD (chronic obstructive pulmonary disease) ([place])   . Coronary artery disease   . Hypertension   . Sleep apnea     Surgical History: Past Surgical History:  Procedure Laterality Date  . CARDIAC SURGERY    . CARDIOVERSION  2013  . CATARACT EXTRACTION W/PHACO Right 08/06/2019   Procedure: CATARACT EXTRACTION PHACO AND INTRAOCULAR LENS PLACEMENT (IOC) RIGHT 12.34  01:25.4  14.5%;  Surgeon: Leandrew Koyanagi, MD;  Location: [place];  Service: Ophthalmology;  Laterality: Right;  . CATARACT EXTRACTION W/PHACO Left 08/27/2019   Procedure: CATARACT EXTRACTION PHACO AND INTRAOCULAR LENS PLACEMENT (IOC) LEFT 7.92 01:16.9 10.3%;  Surgeon: Leandrew Koyanagi, MD;  Location: [place];  Service: Ophthalmology;  Laterality: Left;  sleep apnea  . COLONOSCOPY WITH PROPOFOL N/A 11/07/2017   Procedure: COLONOSCOPY WITH PROPOFOL;  Surgeon: Manya Silvas, MD;  Location: [facility] ENDOSCOPY;  Service: Endoscopy;  Laterality: N/A;  . CORONARY ANGIOPLASTY WITH STENT PLACEMENT  2011  . MITRAL VALVE REPAIR  1997    Home Medications:  Allergies as of 10/22/2019      Reactions   Lisinopril Other (See Comments)   Severe urination Other reaction(s): Other (See Comments),  Other (See Comments) Severe urination Severe urination Severe urination      Medication List       Accurate as of October 22, 2019  3:28 PM. If you have any questions, ask your nurse or doctor.        Advair Diskus 250-50 MCG/DOSE Aepb Generic drug: Fluticasone-Salmeterol Inhale 1 puff into the lungs 2 (two) times daily.   allopurinol 100 MG tablet Commonly known as: ZYLOPRIM Take 100 mg by mouth daily.   amoxicillin 500 MG capsule Commonly known as: AMOXIL TAKE 4 CAPSULES BY MOUTH ONE HOUR BEFORE APPOINTMENT   aspirin EC 81 MG tablet Take 81 mg by mouth daily.   atorvastatin 20 MG tablet Commonly known as: LIPITOR Take 20 mg by mouth daily.   CALCIUM 600 + D PO Take by mouth 2 (two) times daily.   colchicine 0.6 MG tablet Take 0.6 mg by mouth as directed. Take 2 tablets at onset of gout flare, then 1 tablet one hour later   enalapril 20 MG tablet Commonly known as: VASOTEC Take 20 mg by mouth 2 (two) times daily.   fluticasone 50 MCG/ACT nasal spray Commonly known as: FLONASE Place into both nostrils daily.   furosemide 40 MG tablet Commonly known as: LASIX Take 40 mg by mouth daily.   Klor-Con M10 10 MEQ tablet Generic drug: potassium chloride Take 20 mEq by mouth daily.   metolazone 5 MG tablet Commonly known as: ZAROXOLYN Take 5 mg by mouth as directed.  metoprolol succinate 100 MG 24 hr tablet Commonly known as: TOPROL-XL Take 100 mg by mouth daily.   montelukast 10 MG tablet Commonly known as: SINGULAIR TAKE 1 TABLET (10 MG TOTAL) BY MOUTH NIGHTLY.   Multiple Vitamins tablet Take 1 tablet by mouth daily.   Pradaxa 150 MG Caps capsule Generic drug: dabigatran Take 150 mg by mouth 2 (two) times daily.   albuterol (2.5 MG/3ML) 0.083% nebulizer solution Commonly known as: PROVENTIL Take 2.5 mg by nebulization every 6 (six) hours as needed for wheezing.   ProAir HFA 108 (90 Base) MCG/ACT inhaler Generic drug: albuterol Inhale 2 puffs into  the lungs every 6 (six) hours as needed for wheezing.   tamsulosin 0.4 MG Caps capsule Commonly known as: FLOMAX TAKE 1 CAPSULE (0.4 MG TOTAL) BY MOUTH DAILY AFTER SUPPER.   TART CHERRY ADVANCED PO Take by mouth.   tiotropium 18 MCG inhalation capsule Commonly known as: SPIRIVA Place 18 mcg into inhaler and inhale daily.       Allergies:  Allergies  Allergen Reactions  . Lisinopril Other (See Comments)    Severe urination Other reaction(s): Other (See Comments), Other (See Comments) Severe urination Severe urination Severe urination    Family History: Family History  Problem Relation Age of Onset  . Hypertension Mother   . Valvular heart disease Father     Social History:   reports that he quit smoking about 24 years ago. His smoking use included cigarettes. He has a 25.00 pack-year smoking history. He has never used smokeless tobacco. He reports current alcohol use. He reports that he does not use drugs.  Physical Exam: BP (!) 147/76   Pulse 80   Ht 5\' 9"  (1.753 m)   Wt 286 lb (129.7 kg)   BMI 42.23 kg/m   Constitutional:  Alert and oriented, no acute distress, nontoxic appearing HEENT: [place], AT Cardiovascular: No clubbing, cyanosis, or edema Respiratory: Normal respiratory effort, no increased work of breathing Skin: No rashes, bruises or suspicious lesions Neurologic: Grossly intact, no focal deficits, moving all 4 extremities Psychiatric: Normal mood and affect  Assessment & Plan:   1. Prostate cancer ([place]) Last ADT administration today.  Counseled him to continue calcium and vitamin D supplements.  No changes today warranting further evaluation or intervention. - leuprolide (6 Month) (ELIGARD) injection 45 mg - PSA  Return in about 6 months (around 04/23/2020) for Post-ADT f/u with Dr. Diamantina Providence with PSA prior.  Debroah Loop, PA-C  [facility] [address] [place], [place] [address] [phone]

## 2019-10-22 NOTE — Progress Notes (Signed)
Eligard SubQ Injection   Due to Prostate Cancer patient is present today for a Eligard Injection.  Medication: Eligard 6 month Dose: 45 mg  Location: left  Lot: [id number] Exp: 04/2021  Patient tolerated well, no complications were noted  Performed by: Fonnie Jarvis, CMA  Dose today completes 2 years of ADT, follow up in 64mo w/PSA

## 2019-10-23 LAB — PSA: Prostate Specific Ag, Serum: <0.1 ng/mL (ref 0.0–4.0)

## 2019-10-27 ENCOUNTER — Telehealth: Payer: Self-pay

## 2019-10-27 NOTE — Telephone Encounter (Signed)
-----   Message from Billey Co, MD sent at 10/25/2019 11:19 AM EDT ----- Doristine Devoid news, PSA 0 after prostate cancer treatment, keep follow-up as scheduled with me in 6 months  Nickolas Madrid, MD 10/25/2019

## 2019-10-27 NOTE — Telephone Encounter (Signed)
Called pt informed her of the information below. Pt gave verbal understanding.  

## 2019-11-06 ENCOUNTER — Other Ambulatory Visit: Payer: Self-pay

## 2019-11-06 ENCOUNTER — Ambulatory Visit (INDEPENDENT_AMBULATORY_CARE_PROVIDER_SITE_OTHER): Payer: Medicare PPO | Admitting: Vascular Surgery

## 2019-11-06 ENCOUNTER — Encounter (INDEPENDENT_AMBULATORY_CARE_PROVIDER_SITE_OTHER): Payer: Self-pay | Admitting: Vascular Surgery

## 2019-11-06 VITALS — BP 120/73 | HR 89 | Resp 16 | Ht 69.0 in | Wt 282.0 lb

## 2019-11-06 DIAGNOSIS — I4891 Unspecified atrial fibrillation: Secondary | ICD-10-CM

## 2019-11-06 DIAGNOSIS — I2510 Atherosclerotic heart disease of native coronary artery without angina pectoris: Secondary | ICD-10-CM | POA: Diagnosis not present

## 2019-11-06 DIAGNOSIS — J439 Emphysema, unspecified: Secondary | ICD-10-CM

## 2019-11-06 DIAGNOSIS — I10 Essential (primary) hypertension: Secondary | ICD-10-CM | POA: Diagnosis not present

## 2019-11-06 DIAGNOSIS — I714 Abdominal aortic aneurysm, without rupture, unspecified: Secondary | ICD-10-CM

## 2019-11-06 NOTE — Progress Notes (Signed)
MRN : [id number]  ANSELMO REIHL is a 74 y.o. ([birth year]) male who presents with chief complaint of  Chief Complaint  Patient presents with  . New Patient (Initial Visit)    ref [place] AAA  .  History of Present Illness:  The patient presents to the office for evaluation of an abdominal aortic aneurysm. The aneurysm was found incidentally by CT scan several years ago, recent non-contrasted CT shows the AAA has no reached 5 cm. Patient denies abdominal pain or unusual back pain, no other abdominal complaints.  No history of an acute onset of painful blue discoloration of the toes.     No family history of AAA.   Patient denies amaurosis fugax or TIA symptoms. There is no history of claudication or rest pain symptoms of the lower extremities.  The patient denies angina or shortness of breath.  I have reviewed the CT personally with the patient and it shows an AAA that measures 5.00 cm  Current Meds  Medication Sig  . albuterol (PROAIR HFA) 108 (90 Base) MCG/ACT inhaler Inhale 2 puffs into the lungs every 6 (six) hours as needed for wheezing.   Marland Kitchen albuterol (PROVENTIL) (2.5 MG/3ML) 0.083% nebulizer solution Take 2.5 mg by nebulization every 6 (six) hours as needed for wheezing.  Marland Kitchen allopurinol (ZYLOPRIM) 100 MG tablet Take 100 mg by mouth daily.  Marland Kitchen aspirin EC 81 MG tablet Take 81 mg by mouth daily.   Marland Kitchen atorvastatin (LIPITOR) 20 MG tablet Take 20 mg by mouth daily.  . Calcium Carb-Cholecalciferol (CALCIUM 600 + D PO) Take by mouth 2 (two) times daily.  . colchicine 0.6 MG tablet Take 0.6 mg by mouth as directed. Take 2 tablets at onset of gout flare, then 1 tablet one hour later  . dabigatran (PRADAXA) 150 MG CAPS capsule Take 150 mg by mouth 2 (two) times daily.   . enalapril (VASOTEC) 20 MG tablet Take 20 mg by mouth 2 (two) times daily.   . fluticasone (FLONASE) 50 MCG/ACT nasal spray Place into both nostrils daily.  . Fluticasone-Salmeterol (ADVAIR DISKUS) 250-50 MCG/DOSE AEPB  Inhale 1 puff into the lungs 2 (two) times daily.   . furosemide (LASIX) 40 MG tablet Take 40 mg by mouth daily.   Marland Kitchen KLOR-CON M10 10 MEQ tablet Take 20 mEq by mouth daily.   . metolazone (ZAROXOLYN) 5 MG tablet Take 5 mg by mouth as directed.   . metoprolol succinate (TOPROL-XL) 100 MG 24 hr tablet Take 100 mg by mouth daily.  . Misc Natural Products (TART CHERRY ADVANCED PO) Take by mouth.  . montelukast (SINGULAIR) 10 MG tablet TAKE 1 TABLET (10 MG TOTAL) BY MOUTH NIGHTLY.  . Multiple Vitamins tablet Take 1 tablet by mouth daily.   . tamsulosin (FLOMAX) 0.4 MG CAPS capsule TAKE 1 CAPSULE (0.4 MG TOTAL) BY MOUTH DAILY AFTER SUPPER.  Marland Kitchen tiotropium (SPIRIVA) 18 MCG inhalation capsule Place 18 mcg into inhaler and inhale daily.   Current Facility-Administered Medications for the 11/06/19 encounter (Office Visit) with Delana Meyer, Dolores Lory, MD  Medication  . gentamicin (GARAMYCIN) injection 80 mg    Past Medical History:  Diagnosis Date  . Atrial fibrillation ([place])   . Cancer [facility])    prostate cancer  . CHF (congestive heart failure) ([place])   . COPD (chronic obstructive pulmonary disease) ([place])   . Coronary artery disease   . Hypertension   . Sleep apnea     Past Surgical History:  Procedure Laterality Date  .  CARDIAC SURGERY    . CARDIOVERSION  2013  . CATARACT EXTRACTION W/PHACO Right 08/06/2019   Procedure: CATARACT EXTRACTION PHACO AND INTRAOCULAR LENS PLACEMENT (IOC) RIGHT 12.34  01:25.4  14.5%;  Surgeon: Leandrew Koyanagi, MD;  Location: [place];  Service: Ophthalmology;  Laterality: Right;  . CATARACT EXTRACTION W/PHACO Left 08/27/2019   Procedure: CATARACT EXTRACTION PHACO AND INTRAOCULAR LENS PLACEMENT (IOC) LEFT 7.92 01:16.9 10.3%;  Surgeon: Leandrew Koyanagi, MD;  Location: [place];  Service: Ophthalmology;  Laterality: Left;  sleep apnea  . COLONOSCOPY WITH PROPOFOL N/A 11/07/2017   Procedure: COLONOSCOPY WITH PROPOFOL;  Surgeon: Manya Silvas,  MD;  Location: [facility] ENDOSCOPY;  Service: Endoscopy;  Laterality: N/A;  . CORONARY ANGIOPLASTY WITH STENT PLACEMENT  2011  . MITRAL VALVE REPAIR  1997    Social History Social History   Tobacco Use  . Smoking status: Former Smoker    Packs/day: 1.00    Years: 25.00    Pack years: 25.00    Types: Cigarettes    Quit date: 06/21/1995    Years since quitting: 24.3  . Smokeless tobacco: Never Used  Vaping Use  . Vaping Use: Never used  Substance Use Topics  . Alcohol use: Yes    Comment: Rare  . Drug use: Never    Family History Family History  Problem Relation Age of Onset  . Hypertension Mother   . Valvular heart disease Father   No family history of bleeding/clotting disorders, porphyria or autoimmune disease   Allergies  Allergen Reactions  . Lisinopril Other (See Comments)    Severe urination Other reaction(s): Other (See Comments), Other (See Comments) Severe urination Severe urination Severe urination     REVIEW OF SYSTEMS (Negative unless checked)  Constitutional: [] Weight loss  [] Fever  [] Chills Cardiac: [x] Chest pain   [] Chest pressure   [] Palpitations   [] Shortness of breath when laying flat   [] Shortness of breath with exertion. Vascular:  [] Pain in legs with walking   [] Pain in legs at rest  [] History of DVT   [] Phlebitis   [] Swelling in legs   [] Varicose veins   [] Non-healing ulcers Pulmonary:   [] Uses home oxygen   [] Productive cough   [] Hemoptysis   [] Wheeze  [x] COPD   [] Asthma Neurologic:  [] Dizziness   [] Seizures   [] History of stroke   [] History of TIA  [] Aphasia   [] Vissual changes   [] Weakness or numbness in arm   [] Weakness or numbness in leg Musculoskeletal:   [] Joint swelling   [x] Joint pain   [] Low back pain Hematologic:  [] Easy bruising  [] Easy bleeding   [] Hypercoagulable state   [] Anemic Gastrointestinal:  [] Diarrhea   [] Vomiting  [] Gastroesophageal reflux/heartburn   [] Difficulty swallowing. Genitourinary:  [] Chronic kidney disease    [] Difficult urination  [] Frequent urination   [] Blood in urine Skin:  [] Rashes   [] Ulcers  Psychological:  [] History of anxiety   []  History of major depression.  Physical Examination  Vitals:   11/06/19 1601  BP: 120/73  Pulse: 89  Resp: 16  Weight: (!) 282 lb (127.9 kg)  Height: 5\' 9"  (1.753 m)   Body mass index is 41.64 kg/m. Gen: WD/WN, NAD Head: [place]/AT, No temporalis wasting.  Ear/Nose/Throat: Hearing grossly intact, nares w/o erythema or drainage Eyes: PER, EOMI, sclera nonicteric.  Neck: Supple, no large masses.   Pulmonary:  Good air movement, no audible wheezing bilaterally, no use of accessory muscles.  Cardiac: RRR, no JVD Vascular: No carotid bruits Vessel Right Left  Radial Palpable Palpable  Carotid Palpable Palpable  Popliteal Palpable not enlarged Palpable not enlarged  Gastrointestinal: Non-distended. No guarding/no peritoneal signs.  Musculoskeletal: M/S 5/5 throughout.  No deformity or atrophy.  Neurologic: CN 2-12 intact. Symmetrical.  Speech is fluent. Motor exam as listed above. Psychiatric: Judgment intact, Mood & affect appropriate for pt's clinical situation. Dermatologic: No rashes or ulcers noted.  No changes consistent with cellulitis.   CBC Lab Results  Component Value Date   WBC 7.8 11/16/2018   HGB 10.5 (L) 11/16/2018   HCT 32.1 (L) 11/16/2018   MCV 95.0 11/16/2018   PLT 103 (L) 11/16/2018    BMET    Component Value Date/Time   NA 138 11/16/2018 0227   K 3.4 (L) 11/16/2018 0227   CL 104 11/16/2018 0227   CO2 28 11/16/2018 0227   GLUCOSE 162 (H) 11/16/2018 0227   BUN 17 11/16/2018 0227   CREATININE 0.75 11/16/2018 0227   CALCIUM 8.5 (L) 11/16/2018 0227   GFRNONAA >60 11/16/2018 0227   GFRAA >60 11/16/2018 0227   CrCl cannot be calculated (Patient's most recent lab result is older than the maximum 21 days allowed.).  COAG Lab Results  Component Value Date   INR 1.7 (H) 11/15/2018    Radiology CT ABDOMEN PELVIS WO  CONTRAST  Result Date: 10/17/2019 CLINICAL DATA:  74 year old male with history of chronic bronchitis with shortness of breath. Prostate cancer. EXAM: CT CHEST, ABDOMEN AND PELVIS WITHOUT CONTRAST TECHNIQUE: Multidetector CT imaging of the chest, abdomen and pelvis was performed following the standard protocol without IV contrast. COMPARISON:  Chest CTA 04/08/2019. CT the chest, abdomen scotch that CT the abdomen and pelvis 03/26/2018. FINDINGS: CT CHEST FINDINGS Cardiovascular: Heart size is mildly enlarged. There is no significant pericardial fluid, thickening or pericardial calcification. There is aortic atherosclerosis, as well as atherosclerosis of the great vessels of the mediastinum and the coronary arteries, including calcified atherosclerotic plaque in the left main, left anterior descending, left circumflex and right coronary arteries. Status post median sternotomy for mitral annuloplasty. Mediastinum/Nodes: No pathologically enlarged mediastinal or hilar lymph nodes. Please note that accurate exclusion of hilar adenopathy is limited on noncontrast CT scans. Esophagus is unremarkable in appearance. No axillary lymphadenopathy. Lungs/Pleura: A few scattered tiny pulmonary nodules are noted in the lungs bilaterally, largest of which measures 5 mm in the right upper lobe (axial image 65 of series 3). Small amount of peribronchovascular ground-glass attenuation in the left lower lobe where there is also some micronodularity. No confluent consolidative airspace disease. No pleural effusions. Musculoskeletal: Median sternotomy. There are no aggressive appearing lytic or blastic lesions noted in the visualized portions of the skeleton. CT ABDOMEN PELVIS FINDINGS Hepatobiliary: No suspicious cystic or solid hepatic lesions are confidently identified on today's noncontrast CT examination. Unenhanced appearance of the gallbladder is normal. Pancreas: No definite pancreatic or peripancreatic fluid collections or  inflammatory changes are noted on today's noncontrast CT examination. Spleen: Unremarkable. Adrenals/Urinary Tract: Unenhanced appearance of the kidneys and bilateral adrenal glands is normal. No hydroureteronephrosis. Urinary bladder is normal in appearance. Stomach/Bowel: Unenhanced appearance of the stomach is normal. No pathologic dilatation of small bowel or colon. Vascular/Lymphatic: Aortic atherosclerosis with fusiform aneurysmal dilatation of the infrarenal abdominal aorta measuring up to 5 cm in diameter. No lymphadenopathy noted in the abdomen or pelvis. Reproductive: Prostate gland and seminal vesicles are unremarkable in appearance. Other: No significant volume of ascites.  No pneumoperitoneum. Musculoskeletal: There are no aggressive appearing lytic or blastic lesions noted in the visualized portions of the  skeleton. IMPRESSION: 1. A few scattered pulmonary nodules in the lungs bilaterally measuring 5 mm or less in size are nonspecific, but new compared to the prior study 04/08/2019. Close attention on follow-up imaging is recommended to ensure the resolution of these findings, as the possibility of metastatic disease is not excluded. 2. Very mild patchy peribronchovascular ground-glass attenuation micronodularity in the left lower lobe which may represent developing bronchopneumonia or sequela of mild aspiration. 3. No evidence of metastatic disease in the abdomen or pelvis. 4. Aortic atherosclerosis, in addition to left main and 3 vessel coronary artery disease. Assessment for potential risk factor modification, dietary therapy or pharmacologic therapy may be warranted, if clinically indicated. 5. There is also fusiform aneurysmal dilatation of the infrarenal abdominal aorta measuring up to 5 cm in diameter. Recommend followup by abdomen and pelvis CTA in 6 months, and vascular surgery referral/consultation if not already obtained. This recommendation follows ACR consensus guidelines: White Paper of  the ACR Incidental Findings Committee II on Vascular Findings. J Am Coll Radiol 2013; 10:789-794. Electronically Signed   By: Vinnie Langton M.D.   On: 10/17/2019 15:12   CT CHEST WO CONTRAST  Result Date: 10/17/2019 CLINICAL DATA:  74 year old male with history of chronic bronchitis with shortness of breath. Prostate cancer. EXAM: CT CHEST, ABDOMEN AND PELVIS WITHOUT CONTRAST TECHNIQUE: Multidetector CT imaging of the chest, abdomen and pelvis was performed following the standard protocol without IV contrast. COMPARISON:  Chest CTA 04/08/2019. CT the chest, abdomen scotch that CT the abdomen and pelvis 03/26/2018. FINDINGS: CT CHEST FINDINGS Cardiovascular: Heart size is mildly enlarged. There is no significant pericardial fluid, thickening or pericardial calcification. There is aortic atherosclerosis, as well as atherosclerosis of the great vessels of the mediastinum and the coronary arteries, including calcified atherosclerotic plaque in the left main, left anterior descending, left circumflex and right coronary arteries. Status post median sternotomy for mitral annuloplasty. Mediastinum/Nodes: No pathologically enlarged mediastinal or hilar lymph nodes. Please note that accurate exclusion of hilar adenopathy is limited on noncontrast CT scans. Esophagus is unremarkable in appearance. No axillary lymphadenopathy. Lungs/Pleura: A few scattered tiny pulmonary nodules are noted in the lungs bilaterally, largest of which measures 5 mm in the right upper lobe (axial image 65 of series 3). Small amount of peribronchovascular ground-glass attenuation in the left lower lobe where there is also some micronodularity. No confluent consolidative airspace disease. No pleural effusions. Musculoskeletal: Median sternotomy. There are no aggressive appearing lytic or blastic lesions noted in the visualized portions of the skeleton. CT ABDOMEN PELVIS FINDINGS Hepatobiliary: No suspicious cystic or solid hepatic lesions are  confidently identified on today's noncontrast CT examination. Unenhanced appearance of the gallbladder is normal. Pancreas: No definite pancreatic or peripancreatic fluid collections or inflammatory changes are noted on today's noncontrast CT examination. Spleen: Unremarkable. Adrenals/Urinary Tract: Unenhanced appearance of the kidneys and bilateral adrenal glands is normal. No hydroureteronephrosis. Urinary bladder is normal in appearance. Stomach/Bowel: Unenhanced appearance of the stomach is normal. No pathologic dilatation of small bowel or colon. Vascular/Lymphatic: Aortic atherosclerosis with fusiform aneurysmal dilatation of the infrarenal abdominal aorta measuring up to 5 cm in diameter. No lymphadenopathy noted in the abdomen or pelvis. Reproductive: Prostate gland and seminal vesicles are unremarkable in appearance. Other: No significant volume of ascites.  No pneumoperitoneum. Musculoskeletal: There are no aggressive appearing lytic or blastic lesions noted in the visualized portions of the skeleton. IMPRESSION: 1. A few scattered pulmonary nodules in the lungs bilaterally measuring 5 mm or less in size are  nonspecific, but new compared to the prior study 04/08/2019. Close attention on follow-up imaging is recommended to ensure the resolution of these findings, as the possibility of metastatic disease is not excluded. 2. Very mild patchy peribronchovascular ground-glass attenuation micronodularity in the left lower lobe which may represent developing bronchopneumonia or sequela of mild aspiration. 3. No evidence of metastatic disease in the abdomen or pelvis. 4. Aortic atherosclerosis, in addition to left main and 3 vessel coronary artery disease. Assessment for potential risk factor modification, dietary therapy or pharmacologic therapy may be warranted, if clinically indicated. 5. There is also fusiform aneurysmal dilatation of the infrarenal abdominal aorta measuring up to 5 cm in diameter. Recommend  followup by abdomen and pelvis CTA in 6 months, and vascular surgery referral/consultation if not already obtained. This recommendation follows ACR consensus guidelines: White Paper of the ACR Incidental Findings Committee II on Vascular Findings. J Am Coll Radiol 2013; 10:789-794. Electronically Signed   By: Vinnie Langton M.D.   On: 10/17/2019 15:12    Assessment/Plan 1. AAA (abdominal aortic aneurysm) without rupture ([place]) Recommend: The aneurysm is > 5 cm and therefore should undergo repair. Patient is status post CT scan of the abdominal aorta. The patient is a candidate for endovascular repair.   He will require cardiac clearance.   The patient will continue antiplatelet therapy as prescribed (since the patient is undergoing endovascular repair as opposed to open repair) as well as aggressive management of hyperlipidemia. Exercise is again strongly encouraged.   The patient is reminded that lifetime routine surveillance is a necessity with an endograft.   The risks and benefits of AAA repair are reviewed with the patient.  All questions are answered.  Alternative therapies are also discussed.  The patient agrees to proceed with endovascular aneurysm repair.  Patient will follow-up with me in the office after the surgery.  2. Arteriosclerosis of coronary artery Continue cardiac and antihypertensive medications as already ordered and reviewed, no changes at this time.  Continue statin as ordered and reviewed, no changes at this time  Nitrates PRN for chest pain   3. Atrial fibrillation with RVR ([facility]) Continue antiarrhythmia medications as already ordered, these medications have been reviewed and there are no changes at this time.  Continue anticoagulation as ordered by Cardiology Service   4. Essential hypertension Continue antihypertensive medications as already ordered, these medications have been reviewed and there are no changes at this time.   5. Pulmonary emphysema,  unspecified emphysema type ([place]) Continue pulmonary medications and aerosols as already ordered, these medications have been reviewed and there are no changes at this time.     [name], MD  11/06/2019 4:59 PM

## 2019-11-06 NOTE — H&P (View-Only) (Signed)
MRN : [id number]  LACOREY BRUSCA is a 74 y.o. (March 15, 1946) male who presents with chief complaint of  Chief Complaint  Patient presents with  . New Patient (Initial Visit)    ref [place] AAA  .  History of Present Illness:  The patient presents to the office for evaluation of an abdominal aortic aneurysm. The aneurysm was found incidentally by CT scan several years ago, recent non-contrasted CT shows the AAA has no reached 5 cm. Patient denies abdominal pain or unusual back pain, no other abdominal complaints.  No history of an acute onset of painful blue discoloration of the toes.     No family history of AAA.   Patient denies amaurosis fugax or TIA symptoms. There is no history of claudication or rest pain symptoms of the lower extremities.  The patient denies angina or shortness of breath.  I have reviewed the CT personally with the patient and it shows an AAA that measures 5.00 cm  Current Meds  Medication Sig  . albuterol (PROAIR HFA) 108 (90 Base) MCG/ACT inhaler Inhale 2 puffs into the lungs every 6 (six) hours as needed for wheezing.   Marland Kitchen albuterol (PROVENTIL) (2.5 MG/3ML) 0.083% nebulizer solution Take 2.5 mg by nebulization every 6 (six) hours as needed for wheezing.  Marland Kitchen allopurinol (ZYLOPRIM) 100 MG tablet Take 100 mg by mouth daily.  Marland Kitchen aspirin EC 81 MG tablet Take 81 mg by mouth daily.   Marland Kitchen atorvastatin (LIPITOR) 20 MG tablet Take 20 mg by mouth daily.  . Calcium Carb-Cholecalciferol (CALCIUM 600 + D PO) Take by mouth 2 (two) times daily.  . colchicine 0.6 MG tablet Take 0.6 mg by mouth as directed. Take 2 tablets at onset of gout flare, then 1 tablet one hour later  . dabigatran (PRADAXA) 150 MG CAPS capsule Take 150 mg by mouth 2 (two) times daily.   . enalapril (VASOTEC) 20 MG tablet Take 20 mg by mouth 2 (two) times daily.   . fluticasone (FLONASE) 50 MCG/ACT nasal spray Place into both nostrils daily.  . Fluticasone-Salmeterol (ADVAIR DISKUS) 250-50 MCG/DOSE AEPB  Inhale 1 puff into the lungs 2 (two) times daily.   . furosemide (LASIX) 40 MG tablet Take 40 mg by mouth daily.   Marland Kitchen KLOR-CON M10 10 MEQ tablet Take 20 mEq by mouth daily.   . metolazone (ZAROXOLYN) 5 MG tablet Take 5 mg by mouth as directed.   . metoprolol succinate (TOPROL-XL) 100 MG 24 hr tablet Take 100 mg by mouth daily.  . Misc Natural Products (TART CHERRY ADVANCED PO) Take by mouth.  . montelukast (SINGULAIR) 10 MG tablet TAKE 1 TABLET (10 MG TOTAL) BY MOUTH NIGHTLY.  . Multiple Vitamins tablet Take 1 tablet by mouth daily.   . tamsulosin (FLOMAX) 0.4 MG CAPS capsule TAKE 1 CAPSULE (0.4 MG TOTAL) BY MOUTH DAILY AFTER SUPPER.  Marland Kitchen tiotropium (SPIRIVA) 18 MCG inhalation capsule Place 18 mcg into inhaler and inhale daily.   Current Facility-Administered Medications for the 11/06/19 encounter (Office Visit) with Delana Meyer, Dolores Lory, MD  Medication  . gentamicin (GARAMYCIN) injection 80 mg    Past Medical History:  Diagnosis Date  . Atrial fibrillation ([place])   . Cancer [facility])    prostate cancer  . CHF (congestive heart failure) ([place])   . COPD (chronic obstructive pulmonary disease) ([place])   . Coronary artery disease   . Hypertension   . Sleep apnea     Past Surgical History:  Procedure Laterality Date  .  CARDIAC SURGERY    . CARDIOVERSION  2013  . CATARACT EXTRACTION W/PHACO Right 08/06/2019   Procedure: CATARACT EXTRACTION PHACO AND INTRAOCULAR LENS PLACEMENT (IOC) RIGHT 12.34  01:25.4  14.5%;  Surgeon: Leandrew Koyanagi, MD;  Location: [facility];  Service: Ophthalmology;  Laterality: Right;  . CATARACT EXTRACTION W/PHACO Left 08/27/2019   Procedure: CATARACT EXTRACTION PHACO AND INTRAOCULAR LENS PLACEMENT (IOC) LEFT 7.92 01:16.9 10.3%;  Surgeon: Leandrew Koyanagi, MD;  Location: [facility];  Service: Ophthalmology;  Laterality: Left;  sleep apnea  . COLONOSCOPY WITH PROPOFOL N/A 11/07/2017   Procedure: COLONOSCOPY WITH PROPOFOL;  Surgeon: Manya Silvas,  MD;  Location: [facility] ENDOSCOPY;  Service: Endoscopy;  Laterality: N/A;  . CORONARY ANGIOPLASTY WITH STENT PLACEMENT  2011  . MITRAL VALVE REPAIR  1997    Social History Social History   Tobacco Use  . Smoking status: Former Smoker    Packs/day: 1.00    Years: 25.00    Pack years: 25.00    Types: Cigarettes    Quit date: 06/21/1995    Years since quitting: 24.3  . Smokeless tobacco: Never Used  Vaping Use  . Vaping Use: Never used  Substance Use Topics  . Alcohol use: Yes    Comment: Rare  . Drug use: Never    Family History Family History  Problem Relation Age of Onset  . Hypertension Mother   . Valvular heart disease Father   No family history of bleeding/clotting disorders, porphyria or autoimmune disease   Allergies  Allergen Reactions  . Lisinopril Other (See Comments)    Severe urination Other reaction(s): Other (See Comments), Other (See Comments) Severe urination Severe urination Severe urination     REVIEW OF SYSTEMS (Negative unless checked)  Constitutional: [] Weight loss  [] Fever  [] Chills Cardiac: [x] Chest pain   [] Chest pressure   [] Palpitations   [] Shortness of breath when laying flat   [] Shortness of breath with exertion. Vascular:  [] Pain in legs with walking   [] Pain in legs at rest  [] History of DVT   [] Phlebitis   [] Swelling in legs   [] Varicose veins   [] Non-healing ulcers Pulmonary:   [] Uses home oxygen   [] Productive cough   [] Hemoptysis   [] Wheeze  [x] COPD   [] Asthma Neurologic:  [] Dizziness   [] Seizures   [] History of stroke   [] History of TIA  [] Aphasia   [] Vissual changes   [] Weakness or numbness in arm   [] Weakness or numbness in leg Musculoskeletal:   [] Joint swelling   [x] Joint pain   [] Low back pain Hematologic:  [] Easy bruising  [] Easy bleeding   [] Hypercoagulable state   [] Anemic Gastrointestinal:  [] Diarrhea   [] Vomiting  [] Gastroesophageal reflux/heartburn   [] Difficulty swallowing. Genitourinary:  [] Chronic kidney disease    [] Difficult urination  [] Frequent urination   [] Blood in urine Skin:  [] Rashes   [] Ulcers  Psychological:  [] History of anxiety   []  History of major depression.  Physical Examination  Vitals:   11/06/19 1601  BP: 120/73  Pulse: 89  Resp: 16  Weight: (!) 282 lb (127.9 kg)  Height: 5\' 9"  (1.753 m)   Body mass index is 41.64 kg/m. Gen: WD/WN, NAD Head: /AT, No temporalis wasting.  Ear/Nose/Throat: Hearing grossly intact, nares w/o erythema or drainage Eyes: PER, EOMI, sclera nonicteric.  Neck: Supple, no large masses.   Pulmonary:  Good air movement, no audible wheezing bilaterally, no use of accessory muscles.  Cardiac: RRR, no JVD Vascular: No carotid bruits Vessel Right Left  Radial Palpable Palpable  Carotid Palpable Palpable  Popliteal Palpable not enlarged Palpable not enlarged  Gastrointestinal: Non-distended. No guarding/no peritoneal signs.  Musculoskeletal: M/S 5/5 throughout.  No deformity or atrophy.  Neurologic: CN 2-12 intact. Symmetrical.  Speech is fluent. Motor exam as listed above. Psychiatric: Judgment intact, Mood & affect appropriate for pt's clinical situation. Dermatologic: No rashes or ulcers noted.  No changes consistent with cellulitis.   CBC Lab Results  Component Value Date   WBC 7.8 11/16/2018   HGB 10.5 (L) 11/16/2018   HCT 32.1 (L) 11/16/2018   MCV 95.0 11/16/2018   PLT 103 (L) 11/16/2018    BMET    Component Value Date/Time   NA 138 11/16/2018 0227   K 3.4 (L) 11/16/2018 0227   CL 104 11/16/2018 0227   CO2 28 11/16/2018 0227   GLUCOSE 162 (H) 11/16/2018 0227   BUN 17 11/16/2018 0227   CREATININE 0.75 11/16/2018 0227   CALCIUM 8.5 (L) 11/16/2018 0227   GFRNONAA >60 11/16/2018 0227   GFRAA >60 11/16/2018 0227   CrCl cannot be calculated (Patient's most recent lab result is older than the maximum 21 days allowed.).  COAG Lab Results  Component Value Date   INR 1.7 (H) 11/15/2018    Radiology CT ABDOMEN PELVIS WO  CONTRAST  Result Date: 10/17/2019 CLINICAL DATA:  74 year old male with history of chronic bronchitis with shortness of breath. Prostate cancer. EXAM: CT CHEST, ABDOMEN AND PELVIS WITHOUT CONTRAST TECHNIQUE: Multidetector CT imaging of the chest, abdomen and pelvis was performed following the standard protocol without IV contrast. COMPARISON:  Chest CTA 04/08/2019. CT the chest, abdomen scotch that CT the abdomen and pelvis 03/26/2018. FINDINGS: CT CHEST FINDINGS Cardiovascular: Heart size is mildly enlarged. There is no significant pericardial fluid, thickening or pericardial calcification. There is aortic atherosclerosis, as well as atherosclerosis of the great vessels of the mediastinum and the coronary arteries, including calcified atherosclerotic plaque in the left main, left anterior descending, left circumflex and right coronary arteries. Status post median sternotomy for mitral annuloplasty. Mediastinum/Nodes: No pathologically enlarged mediastinal or hilar lymph nodes. Please note that accurate exclusion of hilar adenopathy is limited on noncontrast CT scans. Esophagus is unremarkable in appearance. No axillary lymphadenopathy. Lungs/Pleura: A few scattered tiny pulmonary nodules are noted in the lungs bilaterally, largest of which measures 5 mm in the right upper lobe (axial image 65 of series 3). Small amount of peribronchovascular ground-glass attenuation in the left lower lobe where there is also some micronodularity. No confluent consolidative airspace disease. No pleural effusions. Musculoskeletal: Median sternotomy. There are no aggressive appearing lytic or blastic lesions noted in the visualized portions of the skeleton. CT ABDOMEN PELVIS FINDINGS Hepatobiliary: No suspicious cystic or solid hepatic lesions are confidently identified on today's noncontrast CT examination. Unenhanced appearance of the gallbladder is normal. Pancreas: No definite pancreatic or peripancreatic fluid collections or  inflammatory changes are noted on today's noncontrast CT examination. Spleen: Unremarkable. Adrenals/Urinary Tract: Unenhanced appearance of the kidneys and bilateral adrenal glands is normal. No hydroureteronephrosis. Urinary bladder is normal in appearance. Stomach/Bowel: Unenhanced appearance of the stomach is normal. No pathologic dilatation of small bowel or colon. Vascular/Lymphatic: Aortic atherosclerosis with fusiform aneurysmal dilatation of the infrarenal abdominal aorta measuring up to 5 cm in diameter. No lymphadenopathy noted in the abdomen or pelvis. Reproductive: Prostate gland and seminal vesicles are unremarkable in appearance. Other: No significant volume of ascites.  No pneumoperitoneum. Musculoskeletal: There are no aggressive appearing lytic or blastic lesions noted in the visualized portions of the  skeleton. IMPRESSION: 1. A few scattered pulmonary nodules in the lungs bilaterally measuring 5 mm or less in size are nonspecific, but new compared to the prior study 04/08/2019. Close attention on follow-up imaging is recommended to ensure the resolution of these findings, as the possibility of metastatic disease is not excluded. 2. Very mild patchy peribronchovascular ground-glass attenuation micronodularity in the left lower lobe which may represent developing bronchopneumonia or sequela of mild aspiration. 3. No evidence of metastatic disease in the abdomen or pelvis. 4. Aortic atherosclerosis, in addition to left main and 3 vessel coronary artery disease. Assessment for potential risk factor modification, dietary therapy or pharmacologic therapy may be warranted, if clinically indicated. 5. There is also fusiform aneurysmal dilatation of the infrarenal abdominal aorta measuring up to 5 cm in diameter. Recommend followup by abdomen and pelvis CTA in 6 months, and vascular surgery referral/consultation if not already obtained. This recommendation follows ACR consensus guidelines: White Paper of  the ACR Incidental Findings Committee II on Vascular Findings. J Am Coll Radiol 2013; 10:789-794. Electronically Signed   By: Vinnie Langton M.D.   On: 10/17/2019 15:12   CT CHEST WO CONTRAST  Result Date: 10/17/2019 CLINICAL DATA:  74 year old male with history of chronic bronchitis with shortness of breath. Prostate cancer. EXAM: CT CHEST, ABDOMEN AND PELVIS WITHOUT CONTRAST TECHNIQUE: Multidetector CT imaging of the chest, abdomen and pelvis was performed following the standard protocol without IV contrast. COMPARISON:  Chest CTA 04/08/2019. CT the chest, abdomen scotch that CT the abdomen and pelvis 03/26/2018. FINDINGS: CT CHEST FINDINGS Cardiovascular: Heart size is mildly enlarged. There is no significant pericardial fluid, thickening or pericardial calcification. There is aortic atherosclerosis, as well as atherosclerosis of the great vessels of the mediastinum and the coronary arteries, including calcified atherosclerotic plaque in the left main, left anterior descending, left circumflex and right coronary arteries. Status post median sternotomy for mitral annuloplasty. Mediastinum/Nodes: No pathologically enlarged mediastinal or hilar lymph nodes. Please note that accurate exclusion of hilar adenopathy is limited on noncontrast CT scans. Esophagus is unremarkable in appearance. No axillary lymphadenopathy. Lungs/Pleura: A few scattered tiny pulmonary nodules are noted in the lungs bilaterally, largest of which measures 5 mm in the right upper lobe (axial image 65 of series 3). Small amount of peribronchovascular ground-glass attenuation in the left lower lobe where there is also some micronodularity. No confluent consolidative airspace disease. No pleural effusions. Musculoskeletal: Median sternotomy. There are no aggressive appearing lytic or blastic lesions noted in the visualized portions of the skeleton. CT ABDOMEN PELVIS FINDINGS Hepatobiliary: No suspicious cystic or solid hepatic lesions are  confidently identified on today's noncontrast CT examination. Unenhanced appearance of the gallbladder is normal. Pancreas: No definite pancreatic or peripancreatic fluid collections or inflammatory changes are noted on today's noncontrast CT examination. Spleen: Unremarkable. Adrenals/Urinary Tract: Unenhanced appearance of the kidneys and bilateral adrenal glands is normal. No hydroureteronephrosis. Urinary bladder is normal in appearance. Stomach/Bowel: Unenhanced appearance of the stomach is normal. No pathologic dilatation of small bowel or colon. Vascular/Lymphatic: Aortic atherosclerosis with fusiform aneurysmal dilatation of the infrarenal abdominal aorta measuring up to 5 cm in diameter. No lymphadenopathy noted in the abdomen or pelvis. Reproductive: Prostate gland and seminal vesicles are unremarkable in appearance. Other: No significant volume of ascites.  No pneumoperitoneum. Musculoskeletal: There are no aggressive appearing lytic or blastic lesions noted in the visualized portions of the skeleton. IMPRESSION: 1. A few scattered pulmonary nodules in the lungs bilaterally measuring 5 mm or less in size are  nonspecific, but new compared to the prior study 04/08/2019. Close attention on follow-up imaging is recommended to ensure the resolution of these findings, as the possibility of metastatic disease is not excluded. 2. Very mild patchy peribronchovascular ground-glass attenuation micronodularity in the left lower lobe which may represent developing bronchopneumonia or sequela of mild aspiration. 3. No evidence of metastatic disease in the abdomen or pelvis. 4. Aortic atherosclerosis, in addition to left main and 3 vessel coronary artery disease. Assessment for potential risk factor modification, dietary therapy or pharmacologic therapy may be warranted, if clinically indicated. 5. There is also fusiform aneurysmal dilatation of the infrarenal abdominal aorta measuring up to 5 cm in diameter. Recommend  followup by abdomen and pelvis CTA in 6 months, and vascular surgery referral/consultation if not already obtained. This recommendation follows ACR consensus guidelines: White Paper of the ACR Incidental Findings Committee II on Vascular Findings. J Am Coll Radiol 2013; 10:789-794. Electronically Signed   By: Vinnie Langton M.D.   On: 10/17/2019 15:12    Assessment/Plan 1. AAA (abdominal aortic aneurysm) without rupture ([place]) Recommend: The aneurysm is > 5 cm and therefore should undergo repair. Patient is status post CT scan of the abdominal aorta. The patient is a candidate for endovascular repair.   He will require cardiac clearance.   The patient will continue antiplatelet therapy as prescribed (since the patient is undergoing endovascular repair as opposed to open repair) as well as aggressive management of hyperlipidemia. Exercise is again strongly encouraged.   The patient is reminded that lifetime routine surveillance is a necessity with an endograft.   The risks and benefits of AAA repair are reviewed with the patient.  All questions are answered.  Alternative therapies are also discussed.  The patient agrees to proceed with endovascular aneurysm repair.  Patient will follow-up with me in the office after the surgery.  2. Arteriosclerosis of coronary artery Continue cardiac and antihypertensive medications as already ordered and reviewed, no changes at this time.  Continue statin as ordered and reviewed, no changes at this time  Nitrates PRN for chest pain   3. Atrial fibrillation with RVR (HCC) Continue antiarrhythmia medications as already ordered, these medications have been reviewed and there are no changes at this time.  Continue anticoagulation as ordered by Cardiology Service   4. Essential hypertension Continue antihypertensive medications as already ordered, these medications have been reviewed and there are no changes at this time.   5. Pulmonary emphysema,  unspecified emphysema type ([place]) Continue pulmonary medications and aerosols as already ordered, these medications have been reviewed and there are no changes at this time.     [name], MD  11/06/2019 4:59 PM

## 2019-11-07 ENCOUNTER — Encounter: Payer: Self-pay | Admitting: Cardiology

## 2019-11-07 ENCOUNTER — Encounter (INDEPENDENT_AMBULATORY_CARE_PROVIDER_SITE_OTHER): Payer: Self-pay | Admitting: Vascular Surgery

## 2019-11-07 ENCOUNTER — Other Ambulatory Visit: Payer: Self-pay | Admitting: Cardiology

## 2019-11-07 DIAGNOSIS — I714 Abdominal aortic aneurysm, without rupture, unspecified: Secondary | ICD-10-CM | POA: Insufficient documentation

## 2019-11-07 DIAGNOSIS — I4821 Permanent atrial fibrillation: Secondary | ICD-10-CM

## 2019-11-07 NOTE — Progress Notes (Signed)
Brief note for cardiac stratification prior to AAA repair  Patient is a 74 year old gentleman with history of CAD/CABG 1997, chronic A. fib on Pradaxa.  An abdominal CT showed an aortic aneurysm with up to 5 cm dilated patient.  Endovascular repair is being planned by vascular surgery.  Patient last seen by myself in the office on 10/17/2019.  He was asymptomatic with no symptoms of chest pain.  He has COPD from prior smoking with chronic shortness of breath.  Prior Cardiac work-up includes: Echocardiogram on 11/17/2018 showed low normal systolic function, EF 50 to 55%.  Myoview on 02/19/2017 showed normal wall motion, mild apical scar/artifact without any evidence for ischemia.  From a cardiac perspective, patient can undergo procedure at acceptable cardiac risk.  No additional testing or management will change patient's risk.  Obviously, patient is high risk especially with undergoing a vascular procedure.  However, the benefits of undergoing the planned vascular procedure outweigh any risk.  Due to permanent atrial fibrillation, Pradaxa can be held 3 days prior to planned procedure., bridge with lovenox twice daily 3 days prior to procedure.  Since Lovenox typically last 12 hours, it can be held evening before procedure.  Signed, Kate Sable, M.D. 11/07/19 [place], [place]

## 2019-11-10 ENCOUNTER — Telehealth (INDEPENDENT_AMBULATORY_CARE_PROVIDER_SITE_OTHER): Payer: Self-pay

## 2019-11-10 NOTE — Telephone Encounter (Signed)
I attempted to contact the patient and a message was left for a return call. 

## 2019-11-11 NOTE — Telephone Encounter (Signed)
Spoke with the patient and he is scheduled with Dr. Delana Meyer for a Endovascular AAA stent graft repair on 11/26/19 at the [facility]. Patient will do pre-op on 11/19/19 at 9:30 am at the [facility] and covid testing on 11/24/19 between 8-1 pm at the [facility]. Pre-surgical instructions were discussed and will be mailed.

## 2019-11-18 ENCOUNTER — Ambulatory Visit (INDEPENDENT_AMBULATORY_CARE_PROVIDER_SITE_OTHER): Payer: Medicare PPO

## 2019-11-18 ENCOUNTER — Other Ambulatory Visit: Payer: Self-pay

## 2019-11-18 ENCOUNTER — Other Ambulatory Visit (INDEPENDENT_AMBULATORY_CARE_PROVIDER_SITE_OTHER): Payer: Self-pay | Admitting: Nurse Practitioner

## 2019-11-18 DIAGNOSIS — I4821 Permanent atrial fibrillation: Secondary | ICD-10-CM

## 2019-11-18 LAB — ECHOCARDIOGRAM COMPLETE
AR max vel: 4.36 cm2
AV Area VTI: 5.12 cm2
AV Area mean vel: 5.28 cm2
AV Mean grad: 3 mmHg
AV Peak grad: 6.7 mmHg
Ao pk vel: 1.29 m/s
P 1/2 time: 365 msec
S' Lateral: 3.9 cm

## 2019-11-18 MED ORDER — PERFLUTREN LIPID MICROSPHERE
1.0000 mL | INTRAVENOUS | Status: AC | PRN
  Administered 2019-11-18: 2 mL via INTRAVENOUS

## 2019-11-19 ENCOUNTER — Encounter
Admission: RE | Admit: 2019-11-19 | Discharge: 2019-11-19 | Disposition: A | Discharge status: Home / Self Care | Payer: Medicare PPO | Source: Ambulatory Visit | Attending: Vascular Surgery | Admitting: Vascular Surgery

## 2019-11-19 DIAGNOSIS — Z01812 Encounter for preprocedural laboratory examination: Secondary | ICD-10-CM | POA: Insufficient documentation

## 2019-11-19 HISTORY — DX: Depression, unspecified: F32.A

## 2019-11-19 LAB — BASIC METABOLIC PANEL WITH GFR
Anion gap: 5 (ref 5–15)
BUN: 21 mg/dL (ref 8–23)
CO2: 30 mmol/L (ref 22–32)
Calcium: 9.4 mg/dL (ref 8.9–10.3)
Chloride: 106 mmol/L (ref 98–111)
Creatinine, Ser: 0.88 mg/dL (ref 0.61–1.24)
GFR calc Af Amer: >60 mL/min
GFR calc non Af Amer: >60 mL/min
Glucose, Bld: 88 mg/dL (ref 70–99)
Potassium: 3.9 mmol/L (ref 3.5–5.1)
Sodium: 141 mmol/L (ref 135–145)

## 2019-11-19 LAB — CBC WITH DIFFERENTIAL/PLATELET
Abs Immature Granulocytes: 0.02 K/uL (ref 0.00–0.07)
Basophils Absolute: 0 K/uL (ref 0.0–0.1)
Basophils Relative: 0 %
Eosinophils Absolute: 0.3 K/uL (ref 0.0–0.5)
Eosinophils Relative: 5 %
HCT: 35.1 % — ABNORMAL LOW (ref 39.0–52.0)
Hemoglobin: 11.9 g/dL — ABNORMAL LOW (ref 13.0–17.0)
Immature Granulocytes: 0 %
Lymphocytes Relative: 15 %
Lymphs Abs: 0.8 K/uL (ref 0.7–4.0)
MCH: 31.7 pg (ref 26.0–34.0)
MCHC: 33.9 g/dL (ref 30.0–36.0)
MCV: 93.6 fL (ref 80.0–100.0)
Monocytes Absolute: 0.6 K/uL (ref 0.1–1.0)
Monocytes Relative: 10 %
Neutro Abs: 4 K/uL (ref 1.7–7.7)
Neutrophils Relative %: 70 %
Platelets: 129 K/uL — ABNORMAL LOW (ref 150–400)
RBC: 3.75 MIL/uL — ABNORMAL LOW (ref 4.22–5.81)
RDW: 13.2 % (ref 11.5–15.5)
WBC: 5.7 K/uL (ref 4.0–10.5)
nRBC: 0 % (ref 0.0–0.2)

## 2019-11-19 LAB — TYPE AND SCREEN
ABO/RH(D): O NEG
Antibody Screen: NEGATIVE

## 2019-11-19 LAB — SURGICAL PCR SCREEN
MRSA, PCR: NEGATIVE
Staphylococcus aureus: NEGATIVE

## 2019-11-19 LAB — APTT: aPTT: 69 seconds — ABNORMAL HIGH (ref 24–36)

## 2019-11-19 LAB — PROTIME-INR
INR: 2 — ABNORMAL HIGH (ref 0.8–1.2)
Prothrombin Time: 21.7 s — ABNORMAL HIGH (ref 11.4–15.2)

## 2019-11-19 NOTE — Patient Instructions (Signed)
Your procedure is scheduled on: 11/26/19 Report to [place]. To find out your arrival time please call [phone] between 1PM - 3PM on 11/25/19.  Remember: Instructions that are not followed completely may result in serious medical risk, up to and including death, or upon the discretion of your surgeon and anesthesiologist your surgery may need to be rescheduled.     _X__ 1. Do not eat food after midnight the night before your procedure.                 No gum chewing or hard candies. You may drink clear liquids up to 2 hours                 before you are scheduled to arrive for your surgery- DO not drink clear                 liquids within 2 hours of the start of your surgery.                 Clear Liquids include:  water, apple juice without pulp, clear carbohydrate                 drink such as Clearfast or Gatorade, Black Coffee or Tea (Do not add                 anything to coffee or tea). Diabetics water only  __X__2.  On the morning of surgery brush your teeth with toothpaste and water, you                 may rinse your mouth with mouthwash if you wish.  Do not swallow any              toothpaste of mouthwash.     _X__ 3.  No Alcohol for 24 hours before or after surgery.   _X__ 4.  Do Not Smoke or use e-cigarettes For 24 Hours Prior to Your Surgery.                 Do not use any chewable tobacco products for at least 6 hours prior to                 surgery.  ____  5.  Bring all medications with you on the day of surgery if instructed.   __X__  6.  Notify your doctor if there is any change in your medical condition      (cold, fever, infections).     Do not wear jewelry, make-up, hairpins, clips or nail polish. Do not wear lotions, powders, or perfumes.  Do not shave 48 hours prior to surgery. Men may shave face and neck. Do not bring valuables to the hospital.    [facility] is not responsible for any belongings or  valuables.  Contacts, dentures/partials or body piercings may not be worn into surgery. Bring a case for your contacts, glasses or hearing aids, a denture cup will be supplied. Leave your suitcase in the car. After surgery it may be brought to your room. For patients admitted to the hospital, discharge time is determined by your treatment team.   Patients discharged the day of surgery will not be allowed to drive home.   Please read over the following fact sheets that you were given:   MRSA Information  __X__ Take these medicines the morning of surgery with A SIP OF WATER:  1. allopurinol (ZYLOPRIM) 100 MG tablet  2. buPROPion (WELLBUTRIN XL) 150 MG 24 hr tablet  3. metoprolol succinate (TOPROL-XL) 100 MG 24 hr tablet  4.  5.  6.  ____ Fleet Enema (as directed)   __X__ Use CHG Soap/SAGE wipes as directed  __x__ Use inhalers on the day of surgery DO A NEBULIZER TREATMENT BEFORE ARRIVAL  ____ Stop metformin/Janumet/Farxiga 2 days prior to surgery    ____ Take 1/2 of usual insulin dose the night before surgery. No insulin the morning          of surgery.   __X__ Stop Blood Thinners Coumadin/Plavix/Xarelto/Pleta/Pradaxa/Eliquis/Effient/Aspirin  on   Or contact your Surgeon, Cardiologist or Medical Doctor regarding  ability to stop your blood thinners  __X__ Stop Anti-inflammatories 7 days before surgery such as Advil, Ibuprofen, Motrin,  BC or Goodies Powder, Naprosyn, Naproxen, Aleve    __X__ Stop all herbal supplements, fish oil or vitamin E until after surgery.  STOP TART CHERRY TODAY  __X__ Bring C-Pap to the hospital.

## 2019-11-20 ENCOUNTER — Encounter: Payer: Self-pay | Admitting: Cardiology

## 2019-11-20 ENCOUNTER — Ambulatory Visit: Payer: Medicare PPO | Admitting: Cardiology

## 2019-11-20 ENCOUNTER — Other Ambulatory Visit (INDEPENDENT_AMBULATORY_CARE_PROVIDER_SITE_OTHER): Payer: Self-pay | Admitting: Vascular Surgery

## 2019-11-20 ENCOUNTER — Other Ambulatory Visit: Payer: Self-pay

## 2019-11-20 VITALS — BP 168/88 | HR 70 | Ht 69.0 in | Wt 278.0 lb

## 2019-11-20 DIAGNOSIS — I714 Abdominal aortic aneurysm, without rupture, unspecified: Secondary | ICD-10-CM

## 2019-11-20 DIAGNOSIS — I4821 Permanent atrial fibrillation: Secondary | ICD-10-CM

## 2019-11-20 DIAGNOSIS — I10 Essential (primary) hypertension: Secondary | ICD-10-CM

## 2019-11-20 DIAGNOSIS — I77810 Thoracic aortic ectasia: Secondary | ICD-10-CM

## 2019-11-20 DIAGNOSIS — Z951 Presence of aortocoronary bypass graft: Secondary | ICD-10-CM | POA: Diagnosis not present

## 2019-11-20 MED ORDER — ENOXAPARIN SODIUM 150 MG/ML ~~LOC~~ SOLN: 1.5000 mg/kg | SUBCUTANEOUS | 0 refills | Status: DC

## 2019-11-20 NOTE — Telephone Encounter (Signed)
Patient called stating that he is on Pradaxa and is to have Lovenox called into his pharmacy as a bridge for the 3 days he is to be off of the Pradaxa. Dr. Delana Meyer will be sending that into the patient's pharmacy and the patient has been informed.

## 2019-11-20 NOTE — Patient Instructions (Signed)
Medication Instructions:  Your physician recommends that you continue on your current medications as directed. Please refer to the Current Medication list given to you today.  *If you need a refill on your cardiac medications before your next appointment, please call your pharmacy*   Lab Work: None Ordered If you have labs (blood work) drawn today and your tests are completely normal, you will receive your results only by: Marland Kitchen MyChart Message (if you have MyChart) OR . A paper copy in the mail If you have any lab test that is abnormal or we need to change your treatment, we will call you to review the results.   Testing/Procedures: None Ordered   Follow-Up: At [facility], you and your health needs are our priority.  As part of our continuing mission to provide you with exceptional heart care, we have created designated Provider Care Teams.  These Care Teams include your primary Cardiologist (physician) and Advanced Practice Providers (APPs -  Physician Assistants and Nurse Practitioners) who all work together to provide you with the care you need, when you need it.  We recommend signing up for the patient portal called "MyChart".  Sign up information is provided on this After Visit Summary.  MyChart is used to connect with patients for Virtual Visits (Telemedicine).  Patients are able to view lab/test results, encounter notes, upcoming appointments, etc.  Non-urgent messages can be sent to your provider as well.   To learn more about what you can do with MyChart, go to [email or web address].    Your next appointment:   1 month(s)  The format for your next appointment:   In Person  Provider:   Kate Sable, MD   Other Instructions

## 2019-11-20 NOTE — H&P (View-Only) (Signed)
Patient will be bridged for surgery

## 2019-11-20 NOTE — Progress Notes (Signed)
Cardiology Office Note:    Date:  11/20/2019   ID:  Leslie Prince, DOB Mar 17, 1946, MRN [id number]  PCP:  [name], MD  [facility] Cardiologist:  Kate Sable, MD  [place] Electrophysiologist:  None   Referring MD: [name], MD   Chief Complaint  Patient presents with  . Other    Follow up post ECHO. Meds reviewed verbally with patient.     History of Present Illness:    Leslie Prince is a 74 y.o. male with a hx of chronic atrial fibrillation on Pradaxa, CAD PCI w/ DES to LCx 2011, CABG x 3 in 1997, mitral valve repair 1997, AAA, hypertension, former smoker, COPD who presents for follow-up.  Patient being seen due to atrial fibrillation.  He establish care with the practice last visit.  Echocardiogram was obtained to evaluate systolic and diastolic function.  Patient had a recent abdominal CT showing abdominal aortic aneurysm up to 5 cm.  Endovascular repair by vascular surgery is being planned in about 1 week.  Cardiac risk stratification provided previously.  Patient otherwise feels well, denies any chest pain or shortness of breath.  His blood pressure is usually lower than today when he is calm.  BP check during last clinic visit was 179 systolic.  At home it may go up to 150V  Systolic  Past Medical History:  Diagnosis Date  . Atrial fibrillation ([place])   . [facility])    prostate cancer  . CHF (congestive heart failure) ([place])   . COPD (chronic obstructive pulmonary disease) ([place])   . Coronary artery disease   . Depression   . Hypertension   . Sleep apnea     Past Surgical History:  Procedure Laterality Date  . CARDIAC SURGERY    . CARDIAC VALVE REPLACEMENT    . CARDIOVERSION  2013  . CATARACT EXTRACTION W/PHACO Right 08/06/2019   Procedure: CATARACT EXTRACTION PHACO AND INTRAOCULAR LENS PLACEMENT (IOC) RIGHT 12.34  01:25.4  14.5%;  Surgeon: Leandrew Koyanagi, MD;  Location: [place];  Service: Ophthalmology;  Laterality:  Right;  . CATARACT EXTRACTION W/PHACO Left 08/27/2019   Procedure: CATARACT EXTRACTION PHACO AND INTRAOCULAR LENS PLACEMENT (IOC) LEFT 7.92 01:16.9 10.3%;  Surgeon: Leandrew Koyanagi, MD;  Location: [place];  Service: Ophthalmology;  Laterality: Left;  sleep apnea  . COLONOSCOPY WITH PROPOFOL N/A 11/07/2017   Procedure: COLONOSCOPY WITH PROPOFOL;  Surgeon: Manya Silvas, MD;  Location: [facility] ENDOSCOPY;  Service: Endoscopy;  Laterality: N/A;  . CORONARY ANGIOPLASTY WITH STENT PLACEMENT  2011  . CORONARY ARTERY BYPASS GRAFT     during mitral valve repair  . MITRAL VALVE REPAIR  1997    Current Medications: Current Meds  Medication Sig  . albuterol (PROAIR HFA) 108 (90 Base) MCG/ACT inhaler Inhale 2 puffs into the lungs every 6 (six) hours as needed for wheezing.   Marland Kitchen albuterol (PROVENTIL) (2.5 MG/3ML) 0.083% nebulizer solution Take 2.5 mg by nebulization every 6 (six) hours as needed for wheezing.  Marland Kitchen allopurinol (ZYLOPRIM) 100 MG tablet Take 100 mg by mouth daily.  Marland Kitchen aspirin EC 81 MG tablet Take 81 mg by mouth daily.   Marland Kitchen atorvastatin (LIPITOR) 20 MG tablet Take 20 mg by mouth at bedtime.   Marland Kitchen buPROPion (WELLBUTRIN XL) 150 MG 24 hr tablet Take 150 mg by mouth daily.  . Calcium Carb-Cholecalciferol (CALCIUM 600 + D PO) Take by mouth 2 (two) times daily.  . colchicine 0.6 MG tablet Take 0.6 mg by mouth  as directed. Take 2 tablets at onset of gout flare, then 1 tablet one hour later  . dabigatran (PRADAXA) 150 MG CAPS capsule Take 150 mg by mouth 2 (two) times daily.   . enalapril (VASOTEC) 20 MG tablet Take 20 mg by mouth 2 (two) times daily.   . fluticasone (FLONASE) 50 MCG/ACT nasal spray Place into both nostrils daily.  . Fluticasone-Salmeterol (ADVAIR DISKUS) 250-50 MCG/DOSE AEPB Inhale 1 puff into the lungs 2 (two) times daily.   . furosemide (LASIX) 40 MG tablet Take 40 mg by mouth daily.   Marland Kitchen KLOR-CON M10 10 MEQ tablet Take 20 mEq by mouth daily.   . metolazone (ZAROXOLYN)  5 MG tablet Take 5 mg by mouth daily as needed.   . metoprolol succinate (TOPROL-XL) 100 MG 24 hr tablet Take 100 mg by mouth daily.  . Misc Natural Products (TART CHERRY ADVANCED PO) Take by mouth.  . montelukast (SINGULAIR) 10 MG tablet Take 10 mg by mouth at bedtime.   . Multiple Vitamins tablet Take 1 tablet by mouth daily.   . tamsulosin (FLOMAX) 0.4 MG CAPS capsule TAKE 1 CAPSULE (0.4 MG TOTAL) BY MOUTH DAILY AFTER SUPPER.  Marland Kitchen tiotropium (SPIRIVA) 18 MCG inhalation capsule Place 18 mcg into inhaler and inhale daily.   Current Facility-Administered Medications for the 11/20/19 encounter (Office Visit) with Kate Sable, MD  Medication  . gentamicin (GARAMYCIN) injection 80 mg     Allergies:   Lisinopril   Social History   Socioeconomic History  . Marital status: Married    Spouse name: Not on file  . Number of children: Not on file  . Years of education: Not on file  . Highest education level: Not on file  Occupational History  . Not on file  Tobacco Use  . Smoking status: Former Smoker    Packs/day: 1.00    Years: 25.00    Pack years: 25.00    Types: Cigarettes    Quit date: 06/21/1995    Years since quitting: 24.4  . Smokeless tobacco: Never Used  Vaping Use  . Vaping Use: Never used  Substance and Sexual Activity  . Alcohol use: Yes    Comment: Rare  . Drug use: Never  . Sexual activity: Not on file  Other Topics Concern  . Not on file  Social History Narrative  . Not on file   Social Determinants of Health   Financial Resource Strain:   . Difficulty of Paying Living Expenses:   Food Insecurity:   . Worried About Charity fundraiser in the Last Year:   . Arboriculturist in the Last Year:   Transportation Needs:   . Film/video editor (Medical):   Marland Kitchen Lack of Transportation (Non-Medical):   Physical Activity:   . Days of Exercise per Week:   . Minutes of Exercise per Session:   Stress:   . Feeling of Stress :   Social Connections:   .  Frequency of Communication with Friends and Family:   . Frequency of Social Gatherings with Friends and Family:   . Attends Religious Services:   . Active Member of Clubs or Organizations:   . Attends Archivist Meetings:   Marland Kitchen Marital Status:      Family History: The patient's family history includes Hypertension in his mother; Valvular heart disease in his father.  ROS:   Please see the history of present illness.     All other systems reviewed and are negative.  EKGs/Labs/Other Studies Reviewed:    The following studies were reviewed today:   EKG:  EKG is  ordered today.  The ekg ordered today demonstrates atrial fibrillation, heart rate 66  Recent Labs: 11/19/2019: BUN 21; Creatinine, Ser 0.88; Hemoglobin 11.9; Platelets 129; Potassium 3.9; Sodium 141  Recent Lipid Panel No results found for: CHOL, TRIG, HDL, CHOLHDL, VLDL, LDLCALC, LDLDIRECT  Physical Exam:    VS:  BP (!) 168/88 (BP Location: Left Arm, Patient Position: Sitting, Cuff Size: Normal)   Pulse 70   Ht 5\' 9"  (1.753 m)   Wt 278 lb (126.1 kg)   SpO2 94%   BMI 41.05 kg/m     Wt Readings from Last 3 Encounters:  11/20/19 278 lb (126.1 kg)  11/19/19 278 lb 8 oz (126.3 kg)  11/06/19 (!) 282 lb (127.9 kg)     GEN:  Well nourished, well developed in no acute distress HEENT: Normal NECK: No JVD; No carotid bruits LYMPHATICS: No lymphadenopathy CARDIAC: Irregular irregular, no murmurs, rubs, gallops RESPIRATORY:  Clear to auscultation without rales, wheezing or rhonchi  ABDOMEN: Soft, non-tender, non-distended MUSCULOSKELETAL:  No edema; No deformity  SKIN: Warm and dry NEUROLOGIC:  Alert and oriented x 3 PSYCHIATRIC:  Normal affect   ASSESSMENT:    1. Hx of CABG   2. Permanent atrial fibrillation ([place])   3. Essential hypertension   4. AAA (abdominal aortic aneurysm) without rupture ([place])   5. Ascending aorta dilatation ([facility])    PLAN:    In order of problems listed above:  1. Patient  with history of CAD/CABG, PCI to the left circumflex.  Denies any anginal symptoms.  Echo on 11/2019 showed normal systolic function, EF 50 to 55%.  Mild mitral stenosis.  Mild ascending aorta dilatation, 42 mm.  Continue statin, beta-blocker, Pradaxa  2. History of permanent atrial fibrillation, heart rate controlled.  Continue Toprol-XL and Pradaxa.  Recommend bridging with lovenox as stated in my previous note prior to surgery. 3. History of hypertension, BP elevated today.  Continue Toprol, enalapril.  BP usually normal at home.  Close BP monitor at home advised.  If blood pressure stays elevated, plan to add calcium channel blocker/Norvasc. 4. Abdominal aortic aneurysm.  Up to 5 cm, management as per vascular surgery. 5. 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 will monitor with serial echoes every 1 to 2 years as per ACC guidelines.  Follow-up in 1 month after procedure for bp management  Total encounter 40 minutes  Greater than 50% was spent in counseling and coordination of care with the patient  Medication Adjustments/Labs and Tests Ordered: Current medicines are reviewed at length with the patient today.  Concerns regarding medicines are outlined above.  No orders of the defined types were placed in this encounter.  No orders of the defined types were placed in this encounter.   Patient Instructions  Medication Instructions:  Your physician recommends that you continue on your current medications as directed. Please refer to the Current Medication list given to you today.  *If you need a refill on your cardiac medications before your next appointment, please call your pharmacy*   Lab Work: None Ordered If you have labs (blood work) drawn today and your tests are completely normal, you will receive your results only by: Marland Kitchen MyChart Message (if you have MyChart) OR . A paper copy in the mail If you have any lab test that is abnormal or we need to change your treatment, we will call  you to review  the results.   Testing/Procedures: None Ordered   Follow-Up: At [facility], you and your health needs are our priority.  As part of our continuing mission to provide you with exceptional heart care, we have created designated Provider Care Teams.  These Care Teams include your primary Cardiologist (physician) and Advanced Practice Providers (APPs -  Physician Assistants and Nurse Practitioners) who all work together to provide you with the care you need, when you need it.  We recommend signing up for the patient portal called "MyChart".  Sign up information is provided on this After Visit Summary.  MyChart is used to connect with patients for Virtual Visits (Telemedicine).  Patients are able to view lab/test results, encounter notes, upcoming appointments, etc.  Non-urgent messages can be sent to your provider as well.   To learn more about what you can do with MyChart, go to [email or web address].    Your next appointment:   1 month(s)  The format for your next appointment:   In Person  Provider:   Kate Sable, MD   Other Instructions     Signed, Kate Sable, MD  11/20/2019 1:03 PM    [place]

## 2019-11-20 NOTE — Progress Notes (Signed)
Patient will be bridged for surgery

## 2019-11-21 NOTE — Progress Notes (Signed)
[facility] Perioperative Services  Pre-Admission/Anesthesia Testing Clinical Review  Date: 11/25/19  Patient Demographics:  Name: Leslie Prince DOB:   23-May-1945 MRN:   [id number]  Planned Surgical Procedure(s):    Case: [id number] Date/Time: 11/26/19 0815   Procedure: ENDOVASCULAR REPAIR/STENT GRAFT (N/A )   Anesthesia type: General   Diagnosis: AAA (abdominal aortic aneurysm) without rupture ([place]) [I71.4]   Pre-op diagnosis:      Endovascular stent repair    ANESTHESIA    GORE      Dr Delana Meyer with Dr Lucky Cowboy to assist      Covid  Aug 16   Location: [facility] / [place] INVASIVE CV LAB   Providers: Katha Cabal, MD    NOTE: Available PAT nursing documentation and vital signs have been reviewed. Clinical nursing staff has updated patient's PMH/PSHx, current medication list, and drug allergies/intolerances to ensure comprehensive history available to assist in medical decision making as it pertains to the aforementioned surgical procedure and anticipated anesthetic course.   Clinical Discussion:  Leslie Prince is a 74 y.o. male who is submitted for pre-surgical anesthesia review and clearance prior to him undergoing the above procedure. Patient is a Former Smoker (25 pack years; quit 06/1995). Pertinent PMH includes: CAD (s/p PCI in 2011), CHF, AAA, A.fib, MV repair (1997), HTN, PAH, COPD, OSAH (requires nocturnal PAP therapy), depression, prostate cancer.  Patient is followed by cardiology Garen Lah, MD). He was last seen in the cardiology clinic on 11/20/2019; notes reviewed.  At the time of patient's clinic visit, he denied any angina.  Patient with chronic shortness of breath secondary to COPD from previous smoking.  Patient doing well overall from a cardiac perspective.  Patient with known AAA.  Recent CT imaging demonstrates defect measuring up to 5 cm.  Endovascular repair vascular surgery planned for 11/26/2019.  Myoview done in 02/2017 demonstrated no  RWMAs and a mild apical scar without any evidence of ischemia.  Recent TTE reveals an LVEF of 50-55% with mild MV stenosis.    Per cardiology, "from a cardiac perspective, patient can undergo procedure with acceptable cardiac risk.  No additional testing or management will change patient's risk.  Obviously, patient is HIGH risk especially with undergoing vascular procedure.  However, the benefits of undergoing the planned vascular procedure outweigh any risk".   This patient is on daily anticoagulation therapy. He has been instructed on recommendations for holding his Pradaxa for 3 days prior to his procedure. The patient has been instructed that his last dose of his anticoagulant will be on 11/22/2019.  Patient will bridge with enoxaparin twice daily 3 days prior to procedure per cardiology.  Patient is also followed by pulmonology Lanney Gins, MD).  He was last seen in pulmonology clinic on 10/06/2019; notes reviewed.  Patient with chronic shortness of breath related to his COPD and CHF.  Exam revealed 2+ peripheral edema in his BLE.  Patient on daily furosemide therapy; also takes metolazone as needed. Patient denied any recent AECOPD requiring hospitalization.  MDI and SVN treatments continue as previously ordered.  Stressed importance of compliance with ordered nocturnal PAP therapy for treatment of his OSAH.  Recent PFTs demonstrated moderately severe obstructive ventilatory defect with decreased DLCO (71% of predicted). Given the complexity of his cardiopulmonary PMH, clearance from PCCM was also sought by PAT team. Per primary pulmonologist, patient is optimized from a PCCM standpoint and may proceed with planned procedure with a MODERATE risk stratification.   He denies previous intra-operative complications with  anesthesia. He underwent a MAC anesthetic course here (ASA III) in 08/2019 with no documented complications.   Vitals with BMI 11/20/2019 11/19/2019 11/06/2019  Height _0  _1  _2     Weight 278 lbs 278 lbs 8 oz 282 lbs  BMI 41.03 78.29 56.21  Systolic 308 657 846  Diastolic 88 77 73  Pulse 70 67 89    Providers/Specialists:   NOTE: Primary physician provider listed below. Patient may have been seen by APP or partner within same practice.   PROVIDER ROLE LAST Anne Hahn, Dolores Lory, MD Vascular surgery 11/06/2019  [name], MD Primary Care Provider 09/29/2019  Kate Sable, MD Cardiology 11/20/2019  Ottie Glazier, MD Pulmonology 10/24/2019   Allergies:  Lisinopril  Current Home Medications:   . gentamicin (GARAMYCIN) injection 80 mg   . albuterol (PROAIR HFA) 108 (90 Base) MCG/ACT inhaler  . albuterol (PROVENTIL) (2.5 MG/3ML) 0.083% nebulizer solution  . allopurinol (ZYLOPRIM) 100 MG tablet  . aspirin EC 81 MG tablet  . atorvastatin (LIPITOR) 20 MG tablet  . buPROPion (WELLBUTRIN XL) 150 MG 24 hr tablet  . Calcium Carb-Cholecalciferol (CALCIUM 600 + D PO)  . colchicine 0.6 MG tablet  . dabigatran (PRADAXA) 150 MG CAPS capsule  . enalapril (VASOTEC) 20 MG tablet  . fluticasone (FLONASE) 50 MCG/ACT nasal spray  . Fluticasone-Salmeterol (ADVAIR DISKUS) 250-50 MCG/DOSE AEPB  . furosemide (LASIX) 40 MG tablet  . KLOR-CON M10 10 MEQ tablet  . metolazone (ZAROXOLYN) 5 MG tablet  . metoprolol succinate (TOPROL-XL) 100 MG 24 hr tablet  . Misc Natural Products (TART CHERRY ADVANCED PO)  . montelukast (SINGULAIR) 10 MG tablet  . Multiple Vitamins tablet  . tamsulosin (FLOMAX) 0.4 MG CAPS capsule  . tiotropium (SPIRIVA) 18 MCG inhalation capsule  . enoxaparin (LOVENOX) 150 MG/ML injection   History:   Past Medical History:  Diagnosis Date  . Atrial fibrillation ([place])   . Cancer [facility])    prostate cancer  . CHF (congestive heart failure) ([place])   . COPD (chronic obstructive pulmonary disease) ([place])   . Coronary artery disease   . Depression   . Hypertension   . Sleep apnea    Past Surgical History:  Procedure Laterality Date  .  CARDIAC SURGERY    . CARDIAC VALVE REPLACEMENT    . CARDIOVERSION  2013  . CATARACT EXTRACTION W/PHACO Right 08/06/2019   Procedure: CATARACT EXTRACTION PHACO AND INTRAOCULAR LENS PLACEMENT (IOC) RIGHT 12.34  01:25.4  14.5%;  Surgeon: Leandrew Koyanagi, MD;  Location: [place];  Service: Ophthalmology;  Laterality: Right;  . CATARACT EXTRACTION W/PHACO Left 08/27/2019   Procedure: CATARACT EXTRACTION PHACO AND INTRAOCULAR LENS PLACEMENT (IOC) LEFT 7.92 01:16.9 10.3%;  Surgeon: Leandrew Koyanagi, MD;  Location: [place];  Service: Ophthalmology;  Laterality: Left;  sleep apnea  . COLONOSCOPY WITH PROPOFOL N/A 11/07/2017   Procedure: COLONOSCOPY WITH PROPOFOL;  Surgeon: Manya Silvas, MD;  Location: [facility] ENDOSCOPY;  Service: Endoscopy;  Laterality: N/A;  . CORONARY ANGIOPLASTY WITH STENT PLACEMENT  2011  . CORONARY ARTERY BYPASS GRAFT     during mitral valve repair  . MITRAL VALVE REPAIR  1997   Family History  Problem Relation Age of Onset  . Hypertension Mother   . Valvular heart disease Father    Social History   Tobacco Use  . Smoking status: Former Smoker    Packs/day: 1.00    Years: 25.00    Pack years: 25.00    Types: Cigarettes  Quit date: 06/21/1995    Years since quitting: 24.4  . Smokeless tobacco: Never Used  Vaping Use  . Vaping Use: Never used  Substance Use Topics  . Alcohol use: Yes    Comment: Rare  . Drug use: Never    Pertinent Clinical Results:  LABS: Labs reviewed: Acceptable for surgery.  Hospital Outpatient Visit on 11/24/2019  Component Date Value Ref Range Status  . SARS Coronavirus 2 11/24/2019 NEGATIVE  NEGATIVE Final   Comment: (NOTE) SARS-CoV-2 target nucleic acids are NOT DETECTED.  The SARS-CoV-2 RNA is generally detectable in upper and lower respiratory specimens during the acute phase of infection. Negative results do not preclude SARS-CoV-2 infection, do not rule out co-infections with other pathogens, and  should not be used as the sole basis for treatment or other patient management decisions. Negative results must be combined with clinical observations, patient history, and epidemiological information. The expected result is Negative.  Fact Sheet for Patients: SugarRoll.be  Fact Sheet for Healthcare Providers: https://www.woods-mathews.com/  This test is not yet approved or cleared by the [place] FDA and  has been authorized for detection and/or diagnosis of SARS-CoV-2 by FDA under an Emergency Use Authorization (EUA). This EUA will remain  in effect (meaning this test can be used) for the duration of the COVID-19 declaration under Se                          ction 564(b)(1) of the Act, 21 U.S.C. section 360bbb-3(b)(1), unless the authorization is terminated or revoked sooner.  Performed at [facility], [place] [address] [place], [place] [address]     ECG: Date: 10/17/2019 Time ECG obtained: 1412 PM Rate: 66 bpm Rhythm: atrial fibrillation Intervals: QT 454 ms. QTc 475 ms. ST segment and T wave changes: Nonspecific ST abnormality. Comparison: Similar to previous tracing obtained on 11/15/2018   IMAGING / PROCEDURES: ECHOCARDIOGRAM done on 11/18/2019 1. Left ventricular ejection fraction, by estimation, is 50 to 55%.  2. The left ventricle has low normal function.  3. The left ventricle has no regional wall motion abnormalities.  4. There is mild left ventricular hypertrophy. 5. Left ventricular diastolic parameters are indeterminate.  6. Right ventricular systolic function is mildly reduced.  7. The right ventricular size is mildly enlarged. 8. Left atrial size was severely dilated.  9. Right atrial size was mild to moderately dilated.  10. The mitral valve is abnormal. Trivial mitral valve regurgitation. Mild to moderate mitral stenosis. The mean mitral valve gradient is 5.0 mmHg.  11. The aortic valve is tricuspid.  Aortic valve regurgitation is moderate. No aortic stenosis is present.  12. Aortic dilatation noted. There is mild dilatation of the ascending aorta measuring 42 mm.  13. Mildly dilated pulmonary artery.   CT CHEST, ABDOMEN, and PELVIS W/O CONTRAST done on 10/16/2019 1. A few scattered pulmonary nodules in the lungs bilaterally measuring 5 mm or less in size are nonspecific, but new compared to the prior study 04/08/2019. Close attention on follow-up imaging isrecommended to ensure the resolution of these findings, as thepossibility of metastatic disease is not excluded. 2. Very mild patchy peribronchovascular ground-glass attenuation micronodularity in the left lower lobe which may represent developing bronchopneumonia or sequela of mild aspiration. 3. No evidence of metastatic disease in the abdomen or pelvis. 4. Aortic atherosclerosis, in addition to left main and 3 vessel coronary artery disease. Assessment for potential risk factor modification, dietary therapy or pharmacologic therapy may  be warranted, if clinically indicated. 5. There is also fusiform aneurysmal dilatation of the infrarenal abdominal aorta measuring up to 5 cm in diameter. Recommend follow up by abdomen and pelvis CTA in 6 months, and vascular surgery referral/consultation if not already obtained.  PULMONARY FUNCTION TESTING done on 10/06/2019 1. Moderately severe obstructive ventilatory defect with decreased DLCO.  --SPIROMETRY:  FVC was 2.48 liters, 67% of predicted  FEV1 was 1.65, 57% of predicted  FEV1 ratio was 67  FEF 25-75% liters per second was 31% of predicted --LUNG VOLUMES:  TLC was 72% of predicted RV was 65% of predicted --DIFFUSION CAPACITY:  DLCO was 71% of predicted  DLCO/VA was 112% of predicted --FLOW VOLUME LOOP:  Scooping of expiratory limb of flow volume loop suggestive of obstructive physiology.  Impression and Plan:  Leslie Prince has been referred for pre-anesthesia review and  clearance prior him undergoing the planned anesthetic and procedural courses. Available labs, pertinent testing, and imaging results were personally reviewed by me. This patient has been appropriately cleared by both cardiology Garen Lah, MD) and PCCM Lanney Gins, MD).   Based on clinical review performed today (11/25/19), barring any significant acute changes in the patient's overall condition, it is anticipated that he will be able to proceed with the planned surgical intervention. Any acute changes in clinical condition may necessitate his procedure being postponed and/or cancelled. Pre-surgical instructions were reviewed with the patient during his PAT appointment and questions were fielded by PAT clinical staff.  Honor Loh, MSN, APRN, FNP-C, CEN [facility]  Peri-operative Services Nurse Practitioner Phone: [phone] 11/25/19 1:33 PM  NOTE: This note has been prepared using Dragon dictation software. Despite my best ability to proofread, there is always the potential that unintentional transcriptional errors may still occur from this process.

## 2019-11-24 ENCOUNTER — Other Ambulatory Visit
Admission: RE | Admit: 2019-11-24 | Discharge: 2019-11-24 | Disposition: A | Discharge status: Home / Self Care | Payer: Medicare PPO | Source: Ambulatory Visit | Attending: Vascular Surgery | Admitting: Vascular Surgery

## 2019-11-24 ENCOUNTER — Other Ambulatory Visit: Payer: Self-pay

## 2019-11-24 DIAGNOSIS — Z01812 Encounter for preprocedural laboratory examination: Secondary | ICD-10-CM | POA: Insufficient documentation

## 2019-11-24 DIAGNOSIS — Z20822 Contact with and (suspected) exposure to covid-19: Secondary | ICD-10-CM | POA: Insufficient documentation

## 2019-11-24 LAB — SARS CORONAVIRUS 2 (TAT 6-24 HRS): SARS Coronavirus 2: NEGATIVE

## 2019-11-26 ENCOUNTER — Encounter: Payer: Self-pay | Admitting: Vascular Surgery

## 2019-11-26 ENCOUNTER — Inpatient Hospital Stay: Payer: Medicare PPO | Admitting: Urgent Care

## 2019-11-26 ENCOUNTER — Inpatient Hospital Stay
Admission: RE | Admit: 2019-11-26 | Discharge: 2019-11-27 | DRG: 269 | Disposition: A | Discharge status: Home / Self Care | Payer: Medicare PPO | Attending: Vascular Surgery | Admitting: Vascular Surgery

## 2019-11-26 ENCOUNTER — Encounter
Admission: RE | Disposition: A | Discharge status: Home / Self Care | Payer: Self-pay | Source: Home / Self Care | Attending: Vascular Surgery

## 2019-11-26 ENCOUNTER — Other Ambulatory Visit: Payer: Self-pay

## 2019-11-26 DIAGNOSIS — Z87891 Personal history of nicotine dependence: Secondary | ICD-10-CM | POA: Diagnosis not present

## 2019-11-26 DIAGNOSIS — I4891 Unspecified atrial fibrillation: Secondary | ICD-10-CM | POA: Diagnosis present

## 2019-11-26 DIAGNOSIS — I2510 Atherosclerotic heart disease of native coronary artery without angina pectoris: Secondary | ICD-10-CM | POA: Diagnosis present

## 2019-11-26 DIAGNOSIS — F329 Major depressive disorder, single episode, unspecified: Secondary | ICD-10-CM | POA: Diagnosis present

## 2019-11-26 DIAGNOSIS — Z888 Allergy status to other drugs, medicaments and biological substances status: Secondary | ICD-10-CM

## 2019-11-26 DIAGNOSIS — I110 Hypertensive heart disease with heart failure: Secondary | ICD-10-CM | POA: Diagnosis present

## 2019-11-26 DIAGNOSIS — Z951 Presence of aortocoronary bypass graft: Secondary | ICD-10-CM | POA: Diagnosis not present

## 2019-11-26 DIAGNOSIS — J449 Chronic obstructive pulmonary disease, unspecified: Secondary | ICD-10-CM | POA: Diagnosis present

## 2019-11-26 DIAGNOSIS — Z20822 Contact with and (suspected) exposure to covid-19: Secondary | ICD-10-CM | POA: Diagnosis present

## 2019-11-26 DIAGNOSIS — Z7951 Long term (current) use of inhaled steroids: Secondary | ICD-10-CM | POA: Diagnosis not present

## 2019-11-26 DIAGNOSIS — Z79899 Other long term (current) drug therapy: Secondary | ICD-10-CM

## 2019-11-26 DIAGNOSIS — Z8546 Personal history of malignant neoplasm of prostate: Secondary | ICD-10-CM | POA: Diagnosis not present

## 2019-11-26 DIAGNOSIS — Z7982 Long term (current) use of aspirin: Secondary | ICD-10-CM | POA: Diagnosis not present

## 2019-11-26 DIAGNOSIS — I714 Abdominal aortic aneurysm, without rupture, unspecified: Secondary | ICD-10-CM | POA: Diagnosis present

## 2019-11-26 HISTORY — PX: ENDOVASCULAR REPAIR/STENT GRAFT: CATH118280

## 2019-11-26 LAB — ABO/RH: ABO/RH(D): O NEG

## 2019-11-26 SURGERY — ENDOVASCULAR REPAIR/STENT GRAFT
Anesthesia: General

## 2019-11-26 MED ORDER — FENTANYL CITRATE (PF) 100 MCG/2ML IJ SOLN
INTRAMUSCULAR | Status: AC
  Filled 2019-11-26: qty 2

## 2019-11-26 MED ORDER — MONTELUKAST SODIUM 10 MG PO TABS
10.0000 mg | ORAL_TABLET | Freq: Every day | ORAL | Status: DC
  Administered 2019-11-26: 10 mg via ORAL
  Filled 2019-11-26 (×2): qty 1

## 2019-11-26 MED ORDER — ORAL CARE MOUTH RINSE: 15.0000 mL | Freq: Once | OROMUCOSAL | Status: DC

## 2019-11-26 MED ORDER — MORPHINE SULFATE (PF) 4 MG/ML IV SOLN: 2.0000 mg | INTRAVENOUS | Status: DC | PRN

## 2019-11-26 MED ORDER — OXYCODONE HCL 5 MG PO TABS: 5.0000 mg | ORAL_TABLET | Freq: Once | ORAL | Status: DC | PRN

## 2019-11-26 MED ORDER — ONDANSETRON HCL 4 MG/2ML IJ SOLN: 4.0000 mg | Freq: Four times a day (QID) | INTRAMUSCULAR | Status: DC | PRN

## 2019-11-26 MED ORDER — DOCUSATE SODIUM 100 MG PO CAPS
100.0000 mg | ORAL_CAPSULE | Freq: Every day | ORAL | Status: DC
  Administered 2019-11-27: 100 mg via ORAL
  Filled 2019-11-26: qty 1

## 2019-11-26 MED ORDER — ALLOPURINOL 100 MG PO TABS
100.0000 mg | ORAL_TABLET | Freq: Every day | ORAL | Status: DC
  Administered 2019-11-27: 100 mg via ORAL
  Filled 2019-11-26: qty 1

## 2019-11-26 MED ORDER — FENTANYL CITRATE (PF) 100 MCG/2ML IJ SOLN: 25.0000 ug | INTRAMUSCULAR | Status: DC | PRN

## 2019-11-26 MED ORDER — PHENYLEPHRINE HCL-NACL 10-0.9 MG/250ML-% IV SOLN
INTRAVENOUS | Status: DC | PRN
  Administered 2019-11-26: 25 ug/min via INTRAVENOUS

## 2019-11-26 MED ORDER — ENALAPRIL MALEATE 20 MG PO TABS
20.0000 mg | ORAL_TABLET | Freq: Two times a day (BID) | ORAL | Status: DC
  Administered 2019-11-26 – 2019-11-27 (×2): 20 mg via ORAL
  Filled 2019-11-26 (×3): qty 1

## 2019-11-26 MED ORDER — NITROGLYCERIN IN D5W 200-5 MCG/ML-% IV SOLN: 5.0000 ug/min | INTRAVENOUS | Status: DC

## 2019-11-26 MED ORDER — FUROSEMIDE 40 MG PO TABS
40.0000 mg | ORAL_TABLET | Freq: Every day | ORAL | Status: DC
  Administered 2019-11-27: 40 mg via ORAL
  Filled 2019-11-26: qty 1

## 2019-11-26 MED ORDER — ACETAMINOPHEN 325 MG RE SUPP
325.0000 mg | RECTAL | Status: DC | PRN
  Filled 2019-11-26: qty 1

## 2019-11-26 MED ORDER — ALUM & MAG HYDROXIDE-SIMETH 200-200-20 MG/5ML PO SUSP: 15.0000 mL | ORAL | Status: DC | PRN

## 2019-11-26 MED ORDER — ALBUTEROL SULFATE (2.5 MG/3ML) 0.083% IN NEBU: 2.5000 mg | INHALATION_SOLUTION | Freq: Four times a day (QID) | RESPIRATORY_TRACT | Status: DC | PRN

## 2019-11-26 MED ORDER — MOMETASONE FURO-FORMOTEROL FUM 200-5 MCG/ACT IN AERO
2.0000 | INHALATION_SPRAY | Freq: Two times a day (BID) | RESPIRATORY_TRACT | Status: DC
  Administered 2019-11-26 – 2019-11-27 (×2): 2 via RESPIRATORY_TRACT
  Filled 2019-11-26: qty 8.8

## 2019-11-26 MED ORDER — ROCURONIUM BROMIDE 100 MG/10ML IV SOLN
INTRAVENOUS | Status: DC | PRN
  Administered 2019-11-26: 20 mg via INTRAVENOUS
  Administered 2019-11-26: 35 mg via INTRAVENOUS
  Administered 2019-11-26: 5 mg via INTRAVENOUS

## 2019-11-26 MED ORDER — FAMOTIDINE IN NACL 20-0.9 MG/50ML-% IV SOLN
20.0000 mg | Freq: Two times a day (BID) | INTRAVENOUS | Status: DC
  Administered 2019-11-26 – 2019-11-27 (×2): 20 mg via INTRAVENOUS
  Filled 2019-11-26 (×3): qty 50

## 2019-11-26 MED ORDER — CHLORHEXIDINE GLUCONATE CLOTH 2 % EX PADS
6.0000 | MEDICATED_PAD | Freq: Once | CUTANEOUS | Status: AC
  Administered 2019-11-26: 6 via TOPICAL

## 2019-11-26 MED ORDER — HYDRALAZINE HCL 20 MG/ML IJ SOLN: 5.0000 mg | INTRAMUSCULAR | Status: DC | PRN

## 2019-11-26 MED ORDER — DEXAMETHASONE SODIUM PHOSPHATE 10 MG/ML IJ SOLN
INTRAMUSCULAR | Status: DC | PRN
  Administered 2019-11-26: 10 mg via INTRAVENOUS

## 2019-11-26 MED ORDER — ASPIRIN EC 81 MG PO TBEC
81.0000 mg | DELAYED_RELEASE_TABLET | Freq: Every day | ORAL | Status: DC
  Administered 2019-11-27: 81 mg via ORAL

## 2019-11-26 MED ORDER — DABIGATRAN ETEXILATE MESYLATE 150 MG PO CAPS
150.0000 mg | ORAL_CAPSULE | Freq: Two times a day (BID) | ORAL | Status: DC
  Administered 2019-11-27: 150 mg via ORAL
  Filled 2019-11-26: qty 1

## 2019-11-26 MED ORDER — DEXMEDETOMIDINE HCL IN NACL 80 MCG/20ML IV SOLN
INTRAVENOUS | Status: AC
  Filled 2019-11-26: qty 20

## 2019-11-26 MED ORDER — PROPOFOL 10 MG/ML IV BOLUS
INTRAVENOUS | Status: AC
  Filled 2019-11-26: qty 40

## 2019-11-26 MED ORDER — TIOTROPIUM BROMIDE MONOHYDRATE 18 MCG IN CAPS
18.0000 ug | ORAL_CAPSULE | Freq: Every day | RESPIRATORY_TRACT | Status: DC
  Administered 2019-11-27: 18 ug via RESPIRATORY_TRACT
  Filled 2019-11-26: qty 5

## 2019-11-26 MED ORDER — FLUTICASONE PROPIONATE 50 MCG/ACT NA SUSP
2.0000 | Freq: Every day | NASAL | Status: DC
  Administered 2019-11-27: 2 via NASAL
  Filled 2019-11-26: qty 16

## 2019-11-26 MED ORDER — GUAIFENESIN-DM 100-10 MG/5ML PO SYRP
15.0000 mL | ORAL_SOLUTION | ORAL | Status: DC | PRN
  Filled 2019-11-26: qty 15

## 2019-11-26 MED ORDER — CEFAZOLIN SODIUM-DEXTROSE 2-4 GM/100ML-% IV SOLN
2.0000 g | INTRAVENOUS | Status: AC
  Administered 2019-11-26: 2 g via INTRAVENOUS

## 2019-11-26 MED ORDER — FAMOTIDINE 20 MG PO TABS: 20.0000 mg | ORAL_TABLET | Freq: Once | ORAL | Status: DC

## 2019-11-26 MED ORDER — CHLORHEXIDINE GLUCONATE 0.12 % MT SOLN
15.0000 mL | Freq: Once | OROMUCOSAL | Status: DC
  Filled 2019-11-26: qty 15

## 2019-11-26 MED ORDER — IODIXANOL 320 MG/ML IV SOLN
INTRAVENOUS | Status: DC | PRN
  Administered 2019-11-26: 55 mL via INTRA_ARTERIAL

## 2019-11-26 MED ORDER — ATORVASTATIN CALCIUM 20 MG PO TABS
20.0000 mg | ORAL_TABLET | Freq: Every day | ORAL | Status: DC
  Administered 2019-11-26: 20 mg via ORAL
  Filled 2019-11-26 (×2): qty 1

## 2019-11-26 MED ORDER — DOPAMINE-DEXTROSE 3.2-5 MG/ML-% IV SOLN: 3.0000 ug/kg/min | INTRAVENOUS | Status: DC

## 2019-11-26 MED ORDER — COLCHICINE 0.6 MG PO TABS: 0.6000 mg | ORAL_TABLET | ORAL | Status: DC

## 2019-11-26 MED ORDER — LACTATED RINGERS IV SOLN
INTRAVENOUS | Status: DC
  Administered 2019-11-26: 1000 mL via INTRAVENOUS

## 2019-11-26 MED ORDER — FENTANYL CITRATE (PF) 100 MCG/2ML IJ SOLN
INTRAMUSCULAR | Status: DC | PRN
  Administered 2019-11-26 (×2): 50 ug via INTRAVENOUS

## 2019-11-26 MED ORDER — BUPROPION HCL ER (XL) 150 MG PO TB24
150.0000 mg | ORAL_TABLET | Freq: Every day | ORAL | Status: DC
  Administered 2019-11-27: 150 mg via ORAL
  Filled 2019-11-26: qty 1

## 2019-11-26 MED ORDER — MIDAZOLAM HCL 2 MG/2ML IJ SOLN
INTRAMUSCULAR | Status: AC
  Filled 2019-11-26: qty 2

## 2019-11-26 MED ORDER — METOPROLOL TARTRATE 5 MG/5ML IV SOLN: 2.0000 mg | INTRAVENOUS | Status: DC | PRN

## 2019-11-26 MED ORDER — SUGAMMADEX SODIUM 500 MG/5ML IV SOLN
INTRAVENOUS | Status: DC | PRN
  Administered 2019-11-26: 245 mg via INTRAVENOUS

## 2019-11-26 MED ORDER — ENOXAPARIN SODIUM 100 MG/ML ~~LOC~~ SOLN: 1.5000 mg/kg | SUBCUTANEOUS | Status: DC

## 2019-11-26 MED ORDER — HEPARIN SODIUM (PORCINE) 1000 UNIT/ML IJ SOLN
INTRAMUSCULAR | Status: DC | PRN
  Administered 2019-11-26: 6000 [IU] via INTRAVENOUS

## 2019-11-26 MED ORDER — OXYCODONE-ACETAMINOPHEN 5-325 MG PO TABS: 1.0000 | ORAL_TABLET | ORAL | Status: DC | PRN

## 2019-11-26 MED ORDER — DEXAMETHASONE SODIUM PHOSPHATE 10 MG/ML IJ SOLN
INTRAMUSCULAR | Status: AC
  Filled 2019-11-26: qty 1

## 2019-11-26 MED ORDER — ROCURONIUM BROMIDE 10 MG/ML (PF) SYRINGE
PREFILLED_SYRINGE | INTRAVENOUS | Status: AC
  Filled 2019-11-26: qty 10

## 2019-11-26 MED ORDER — CEFAZOLIN SODIUM-DEXTROSE 2-4 GM/100ML-% IV SOLN: 2.0000 g | Freq: Three times a day (TID) | INTRAVENOUS | Status: AC

## 2019-11-26 MED ORDER — PHENYLEPHRINE HCL (PRESSORS) 10 MG/ML IV SOLN
INTRAVENOUS | Status: AC
  Filled 2019-11-26: qty 1

## 2019-11-26 MED ORDER — LABETALOL HCL 5 MG/ML IV SOLN
10.0000 mg | INTRAVENOUS | Status: AC | PRN
  Administered 2019-11-27 (×2): 10 mg via INTRAVENOUS

## 2019-11-26 MED ORDER — ACETAMINOPHEN 325 MG PO TABS: 325.0000 mg | ORAL_TABLET | ORAL | Status: DC | PRN

## 2019-11-26 MED ORDER — METOPROLOL SUCCINATE ER 25 MG PO TB24
100.0000 mg | ORAL_TABLET | Freq: Every day | ORAL | Status: DC
  Administered 2019-11-27: 100 mg via ORAL
  Filled 2019-11-26: qty 4

## 2019-11-26 MED ORDER — CEFAZOLIN SODIUM-DEXTROSE 2-4 GM/100ML-% IV SOLN
INTRAVENOUS | Status: AC
  Administered 2019-11-26: 2 g via INTRAVENOUS
  Filled 2019-11-26: qty 100

## 2019-11-26 MED ORDER — ONDANSETRON HCL 4 MG/2ML IJ SOLN
INTRAMUSCULAR | Status: AC
  Filled 2019-11-26: qty 2

## 2019-11-26 MED ORDER — OXYCODONE HCL 5 MG/5ML PO SOLN
5.0000 mg | Freq: Once | ORAL | Status: DC | PRN
  Filled 2019-11-26: qty 5

## 2019-11-26 MED ORDER — SODIUM CHLORIDE 0.9 % IV SOLN: INTRAVENOUS | Status: DC

## 2019-11-26 MED ORDER — PHENOL 1.4 % MT LIQD
1.0000 | OROMUCOSAL | Status: DC | PRN
  Filled 2019-11-26: qty 177

## 2019-11-26 MED ORDER — SUCCINYLCHOLINE CHLORIDE 20 MG/ML IJ SOLN
INTRAMUSCULAR | Status: DC | PRN
  Administered 2019-11-26: 200 mg via INTRAVENOUS

## 2019-11-26 MED ORDER — PROPOFOL 10 MG/ML IV BOLUS
INTRAVENOUS | Status: DC | PRN
  Administered 2019-11-26: 150 mg via INTRAVENOUS

## 2019-11-26 MED ORDER — SUCCINYLCHOLINE CHLORIDE 200 MG/10ML IV SOSY
PREFILLED_SYRINGE | INTRAVENOUS | Status: AC
  Filled 2019-11-26: qty 10

## 2019-11-26 MED ORDER — HYDROMORPHONE HCL 1 MG/ML IJ SOLN: 1.0000 mg | Freq: Once | INTRAMUSCULAR | Status: DC | PRN

## 2019-11-26 MED ORDER — TAMSULOSIN HCL 0.4 MG PO CAPS
0.4000 mg | ORAL_CAPSULE | Freq: Every day | ORAL | Status: DC
  Administered 2019-11-27: 0.4 mg via ORAL
  Filled 2019-11-26 (×2): qty 1

## 2019-11-26 MED ORDER — ASPIRIN EC 81 MG PO TBEC
81.0000 mg | DELAYED_RELEASE_TABLET | Freq: Every day | ORAL | Status: DC
  Filled 2019-11-26: qty 1

## 2019-11-26 MED ORDER — LACTATED RINGERS IV SOLN: INTRAVENOUS | Status: DC | PRN

## 2019-11-26 MED ORDER — METOLAZONE 5 MG PO TABS
5.0000 mg | ORAL_TABLET | Freq: Every day | ORAL | Status: DC | PRN
  Filled 2019-11-26: qty 1

## 2019-11-26 MED ORDER — ALBUTEROL SULFATE HFA 108 (90 BASE) MCG/ACT IN AERS: 2.0000 | INHALATION_SPRAY | Freq: Four times a day (QID) | RESPIRATORY_TRACT | Status: DC | PRN

## 2019-11-26 MED ORDER — SODIUM CHLORIDE 0.9 % IV SOLN: 500.0000 mL | Freq: Once | INTRAVENOUS | Status: DC | PRN

## 2019-11-26 MED ORDER — MIDAZOLAM HCL 2 MG/2ML IJ SOLN
INTRAMUSCULAR | Status: DC | PRN
  Administered 2019-11-26: 2 mg via INTRAVENOUS

## 2019-11-26 MED ORDER — MAGNESIUM SULFATE 2 GM/50ML IV SOLN
2.0000 g | Freq: Every day | INTRAVENOUS | Status: DC | PRN
  Filled 2019-11-26: qty 50

## 2019-11-26 MED ORDER — SUGAMMADEX SODIUM 500 MG/5ML IV SOLN
INTRAVENOUS | Status: AC
  Filled 2019-11-26: qty 5

## 2019-11-26 MED ORDER — LIDOCAINE HCL (PF) 2 % IJ SOLN
INTRAMUSCULAR | Status: AC
  Filled 2019-11-26: qty 5

## 2019-11-26 MED ORDER — POTASSIUM CHLORIDE CRYS ER 20 MEQ PO TBCR: 20.0000 meq | EXTENDED_RELEASE_TABLET | Freq: Every day | ORAL | Status: DC | PRN

## 2019-11-26 MED ORDER — GLYCOPYRROLATE 0.2 MG/ML IJ SOLN
INTRAMUSCULAR | Status: AC
  Filled 2019-11-26: qty 1

## 2019-11-26 SURGICAL SUPPLY — 28 items
CATH ACCU-VU SIZ PIG 5F 70CM (CATHETERS) ×3 IMPLANT
CATH BALLN CODA 9X100X32 (BALLOONS) ×3 IMPLANT
CATH BEACON 5 .035 65 KMP TIP (CATHETERS) ×3 IMPLANT
COVER PROBE U/S 5X48 (MISCELLANEOUS) ×3 IMPLANT
DEVICE CLOSURE PERCLS PRGLD 6F (VASCULAR PRODUCTS) ×8 IMPLANT
DEVICE SAFEGUARD 24CM (GAUZE/BANDAGES/DRESSINGS) ×6 IMPLANT
DEVICE TORQUE .025-.038 (MISCELLANEOUS) ×3 IMPLANT
DRYSEAL FLEXSHEATH 12FR 33CM (SHEATH) ×2
DRYSEAL FLEXSHEATH 18FR 33CM (SHEATH) ×2
EXCLUDER TNK 28.5X14.5X18CM (Endovascular Graft) ×1 IMPLANT
EXCLUDER TRUNK 28.5X14.5X18CM (Endovascular Graft) ×3 IMPLANT
GLIDEWIRE STIFF .35X180X3 HYDR (WIRE) ×3 IMPLANT
LEG CONTRALATERAL 16X16X13.5 (Endovascular Graft) ×2 IMPLANT
LEG CONTRALATERAL 16X16X9.5 (Endovascular Graft) ×3 IMPLANT
NEEDLE ENTRY 21GA 7CM ECHOTIP (NEEDLE) ×3 IMPLANT
PACK ANGIOGRAPHY (CUSTOM PROCEDURE TRAY) ×3 IMPLANT
PERCLOSE PROGLIDE 6F (VASCULAR PRODUCTS) ×24
SET INTRO CAPELLA COAXIAL (SET/KITS/TRAYS/PACK) ×3 IMPLANT
SHEATH BRITE TIP 6FRX11 (SHEATH) ×6 IMPLANT
SHEATH BRITE TIP 8FRX11 (SHEATH) ×6 IMPLANT
SHEATH DRYSEAL FLEX 12FR 33CM (SHEATH) ×1 IMPLANT
SHEATH DRYSEAL FLEX 18FR 33CM (SHEATH) ×1 IMPLANT
SPONGE XRAY 4X4 16PLY STRL (MISCELLANEOUS) ×9 IMPLANT
STENT GRAFT CONTRALAT 16X13.5 (Endovascular Graft) ×1 IMPLANT
SYR MEDRAD MARK 7 150ML (SYRINGE) ×3 IMPLANT
TUBING CONTRAST HIGH PRESS 72 (TUBING) ×3 IMPLANT
WIRE AMPLATZ SSTIFF .035X260CM (WIRE) ×6 IMPLANT
WIRE J 3MM .035X145CM (WIRE) ×6 IMPLANT

## 2019-11-26 NOTE — Anesthesia Procedure Notes (Signed)
Procedure Name: Intubation Performed by: Demetrius Charity, CRNA Pre-anesthesia Checklist: Patient identified, Patient being monitored, Timeout performed, Emergency Drugs available and Suction available Patient Re-evaluated:Patient Re-evaluated prior to induction Oxygen Delivery Method: Circle system utilized Preoxygenation: Pre-oxygenation with 100% oxygen Induction Type: IV induction Ventilation: Mask ventilation without difficulty Laryngoscope Size: McGraph and 4 Grade View: Grade II Tube type: Oral Tube size: 7.5 mm Number of attempts: 1 Airway Equipment and Method: Stylet and Video-laryngoscopy Placement Confirmation: ETT inserted through vocal cords under direct vision,  positive ETCO2 and breath sounds checked- equal and bilateral Secured at: 24 cm Tube secured with: Tape Dental Injury: Teeth and Oropharynx as per pre-operative assessment

## 2019-11-26 NOTE — Anesthesia Preprocedure Evaluation (Addendum)
Anesthesia Evaluation  Patient identified by MRN, date of birth, ID band Patient awake    Reviewed: Allergy & Precautions, H&P , NPO status , Patient's Chart, lab work & pertinent test results  History of Anesthesia Complications Negative for: history of anesthetic complications  Airway Mallampati: III  TM Distance: <3 FB Neck ROM: limited    Dental  (+) Chipped, Poor Dentition, Missing   Pulmonary shortness of breath and with exertion, sleep apnea , COPD, former smoker,    Pulmonary exam normal        Cardiovascular Exercise Tolerance: Good hypertension, (-) angina+ CAD, + Cardiac Stents, + CABG and +CHF  Normal cardiovascular exam     Neuro/Psych PSYCHIATRIC DISORDERS negative neurological ROS     GI/Hepatic negative GI ROS, (+) Hepatitis -  Endo/Other  negative endocrine ROS  Renal/GU      Musculoskeletal   Abdominal   Peds  Hematology negative hematology ROS (+)   Anesthesia Other Findings Patient has cardiac clearance for this procedure.   Past Medical History: No date: Atrial fibrillation (HCC) No date: Cancer [facility])     Comment:  prostate cancer No date: CHF (congestive heart failure) (HCC) No date: COPD (chronic obstructive pulmonary disease) (HCC) No date: Coronary artery disease No date: Depression No date: Hypertension No date: Sleep apnea  Past Surgical History: No date: CARDIAC SURGERY No date: CARDIAC VALVE REPLACEMENT 2013: CARDIOVERSION 08/06/2019: CATARACT EXTRACTION W/PHACO; Right     Comment:  Procedure: CATARACT EXTRACTION PHACO AND INTRAOCULAR               LENS PLACEMENT (IOC) RIGHT 12.34  01:25.4  14.5%;                Surgeon: Leandrew Koyanagi, MD;  Location: [place];  Service: Ophthalmology;  Laterality:               Right; 08/27/2019: CATARACT EXTRACTION W/PHACO; Left     Comment:  Procedure: CATARACT EXTRACTION PHACO AND INTRAOCULAR                LENS PLACEMENT (IOC) LEFT 7.92 01:16.9 10.3%;  Surgeon:               Leandrew Koyanagi, MD;  Location: [place];              Service: Ophthalmology;  Laterality: Left;  sleep apnea 11/07/2017: COLONOSCOPY WITH PROPOFOL; N/A     Comment:  Procedure: COLONOSCOPY WITH PROPOFOL;  Surgeon: Manya Silvas, MD;  Location: [facility] ENDOSCOPY;  Service:               Endoscopy;  Laterality: N/A; 2011: CORONARY ANGIOPLASTY WITH STENT PLACEMENT No date: CORONARY ARTERY BYPASS GRAFT     Comment:  during mitral valve repair 1997: MITRAL VALVE REPAIR     Reproductive/Obstetrics negative OB ROS                            Anesthesia Physical Anesthesia Plan  ASA: IV  Anesthesia Plan: General ETT   Post-op Pain Management:    Induction: Intravenous  PONV Risk Score and Plan: Ondansetron, Dexamethasone, Midazolam and Treatment may vary due to age or medical condition  Airway Management Planned: Oral ETT and Video Laryngoscope Planned  Additional Equipment:   Intra-op Plan:  Post-operative Plan: Extubation in OR  Informed Consent: I have reviewed the patients History and Physical, chart, labs and discussed the procedure including the risks, benefits and alternatives for the proposed anesthesia with the patient or authorized representative who has indicated his/her understanding and acceptance.     Dental Advisory Given  Plan Discussed with: Anesthesiologist, CRNA and Surgeon  Anesthesia Plan Comments: (Patient consented for risks of anesthesia including but not limited to:  - adverse reactions to medications - damage to eyes, teeth, lips or other oral mucosa - nerve damage due to positioning  - sore throat or hoarseness - Damage to heart, brain, nerves, lungs, other parts of body or loss of life  Patient voiced understanding.)        Anesthesia Quick Evaluation

## 2019-11-26 NOTE — Interval H&P Note (Signed)
History and Physical Interval Note:  11/26/2019 8:09 AM  Leslie Prince  has presented today for surgery, with the diagnosis of Endovascular stent repair    ANESTHESIA    GORE  Dr Delana Meyer with Dr Lucky Cowboy to assist  Covid  Aug 16.  The various methods of treatment have been discussed with the patient and family. After consideration of risks, benefits and other options for treatment, the patient has consented to  Procedure(s): ENDOVASCULAR REPAIR/STENT GRAFT (N/A) as a surgical intervention.  The patient's history has been reviewed, patient examined, no change in status, stable for surgery.  I have reviewed the patient's chart and labs.  Questions were answered to the patient's satisfaction.     [name]

## 2019-11-26 NOTE — Anesthesia Postprocedure Evaluation (Signed)
Anesthesia Post Note  Patient: Leslie Prince  Procedure(s) Performed: ENDOVASCULAR REPAIR/STENT GRAFT (N/A )  Patient location during evaluation: PACU Anesthesia Type: General Level of consciousness: awake and alert Pain management: pain level controlled Vital Signs Assessment: post-procedure vital signs reviewed and stable Respiratory status: spontaneous breathing, nonlabored ventilation, respiratory function stable and patient connected to nasal cannula oxygen Cardiovascular status: blood pressure returned to baseline and stable Postop Assessment: no apparent nausea or vomiting Anesthetic complications: no   No complications documented.   Last Vitals:  Vitals:   11/26/19 1058 11/26/19 1103  BP:  (!) 145/72  Pulse: 70 69  Resp: 10 12  Temp:    SpO2: 94% 92%    Last Pain:  Vitals:   11/26/19 1103  TempSrc:   PainSc: Asleep                 Precious Haws Charese Abundis

## 2019-11-26 NOTE — Op Note (Signed)
OPERATIVE NOTE   PROCEDURE: 1. US guidance for vascular access, bilateral femoral arteries 2. Catheter placement into aorta from bilateral femoral approaches 3. Placement of a 28 x 14 x 18 C3 [place] Excluder Endoprosthesis main body 16 x 14 contralateral limb and a 16 x 10 extender on the left 4. ProGlide closure devices bilateral femoral arteries  PRE-OPERATIVE DIAGNOSIS: AAA  POST-OPERATIVE DIAGNOSIS: same  SURGEON: Leotis Pain, MD and [name], MD - Co-surgeons  ANESTHESIA: General by endotracheal intubation  ESTIMATED BLOOD LOSS: 50 cc  FINDING(S): 1.  AAA  SPECIMEN(S):  none  INDICATIONS:   Leslie Prince is a 74 y.o. male who presents with a 5 cm abdominal aortic aneurysm.  The patient has severe COPD and the anatomy was suitable for endovascular repair.  Risks and benefits of repair in an endovascular fashion were discussed and informed consent was obtained. Co-surgeons are used to expedite the procedure and reduce operative time as bilateral work needs to be done.  DESCRIPTION: After obtaining full informed written consent, the patient was brought back to the operating room and placed supine upon the operating table.  The patient received IV antibiotics prior to induction.  After obtaining adequate anesthesia, the patient was prepped and draped in the standard fashion for endovascular AAA repair.  We then began by gaining access to both femoral arteries with US guidance with me working on the patient's right and Dr. Lucky Cowboy working on the patient's left.  The femoral arteries were found to be patent and accessed without difficulty with a needle under ultrasound guidance without difficulty on each side and permanent images were recorded.  We then placed 2 proglide devices on each side in a pre-close fashion and placed 8 French sheaths. The patient was then given 6000 units of intravenous heparin. The Pigtail catheter was placed into the aorta from the left side. Using this  image, we selected a 28 x 14 x 18 Main body device.  Over a stiff wire, an 16 French sheath was placed. The main body was then placed through the 18 French sheath. A Kumpe catheter was placed up the left side and a magnified image at the renal arteries was performed. The main body was then deployed just below the lowest renal artery. The Kumpe catheter was used to cannulate the contralateral gate without difficulty and successful cannulation was confirmed by twirling the pigtail catheter in the main body. We then placed a stiff wire and a retrograde arteriogram was performed through the left femoral sheath. We upsized to the 12 [place] sheath for the contralateral limb and a 16 x 14 contralateral limb was selected and deployed. The main body deployment was then completed. Based off the angiographic findings, extension limbs were necessary.  An additional 16 x 10 was advanced up the left side and deployed extending the contralateral limb down to the iliac bifurcation. All junction points and seals zones were treated with the compliant balloon. The pigtail catheter was then replaced and a completion angiogram was performed.  No endoleak was detected on completion angiography. The renal arteries were found to be widely patent. At this point we elected to terminate the procedure. We secured the pro glide devices for hemostasis on the femoral arteries. The skin incision was closed with a 4-0 Monocryl. Dermabond and pressure dressing were placed. The patient was taken to the recovery room in stable condition having tolerated the procedure well.  COMPLICATIONS: none  CONDITION: stable  [name]  11/26/2019, 10:17 AM  This note was created with Dragon Medical transcription system. Any errors in dictation are purely unintentional.

## 2019-11-26 NOTE — Transfer of Care (Signed)
Immediate Anesthesia Transfer of Care Note  Patient: Leslie Prince  Procedure(s) Performed: ENDOVASCULAR REPAIR/STENT GRAFT (N/A )  Patient Location: PACU  Anesthesia Type:General  Level of Consciousness: awake, alert  and oriented  Airway & Oxygen Therapy: Patient Spontanous Breathing and Patient connected to face mask oxygen  Post-op Assessment: Report given to RN and Post -op Vital signs reviewed and stable  Post vital signs: Reviewed and stable  Last Vitals:  Vitals Value Taken Time  BP 141/73 11/26/19 1033  Temp    Pulse 74 11/26/19 1034  Resp 24 11/26/19 1034  SpO2 100 % 11/26/19 1034  Vitals shown include unvalidated device data.  Last Pain:  Vitals:   11/26/19 0745  TempSrc: Oral  PainSc: 0-No pain         Complications: No complications documented.

## 2019-11-26 NOTE — Op Note (Signed)
OPERATIVE NOTE   PROCEDURE: 1. US guidance for vascular access, bilateral femoral arteries 2. Catheter placement into aorta from bilateral femoral approaches 3. Placement of a 28 mm proximal, 14 mm distal 18 cm length Gore Excluder Endoprosthesis main body right with a 16 mm x 14 cm left contralateral limb 4. Placement of a 16 mm x 7 cm left iliac extension limb 5. ProGlide closure devices bilateral femoral arteries  PRE-OPERATIVE DIAGNOSIS: AAA  POST-OPERATIVE DIAGNOSIS: same  SURGEON: Leotis Pain, MD and [name], MD - Co-surgeons  ANESTHESIA: general  ESTIMATED BLOOD LOSS: 50 cc  FINDING(S): 1.  AAA  SPECIMEN(S):  none  INDICATIONS:   Leslie Prince is a 74 y.o. male who presents with a >5 cm AAA. The anatomy was suitable for endovascular repair.  Risks and benefits of repair in an endovascular fashion were discussed and informed consent was obtained. Co-surgeons are used to expedite the procedure and reduce operative time as bilateral work needs to be done.  DESCRIPTION: After obtaining full informed written consent, the patient was brought back to the operating room and placed supine upon the operating table.  The patient received IV antibiotics prior to induction.  After obtaining adequate anesthesia, the patient was prepped and draped in the standard fashion for endovascular AAA repair.  We then began by gaining access to both femoral arteries with US guidance with me working on the left and Dr. Delana Meyer working on the right.  The femoral arteries were found to be patent and accessed without difficulty with a needle under ultrasound guidance without difficulty on each side and permanent images were recorded.  We then placed 2 proglide devices on each side in a pre-close fashion and placed 8 French sheaths. The patient was then given 6000 units of intravenous heparin. The Pigtail catheter was placed into the aorta from the left side. Using this image, we selected a 28 mm  proximal, 14 mm distal, 18 cm length Main body device.  Over a stiff wire, an 13 French sheath was placed up the right. The main body was then placed through the 18 French sheath. A Kumpe catheter was placed up the left side and a magnified image at the renal arteries was performed. The main body was then deployed just below the lowest renal artery. The Kumpe catheter was used to cannulate the contralateral gate without difficulty and successful cannulation was confirmed by twirling the pigtail catheter in the main body. We then placed a stiff wire and a retrograde arteriogram was performed through the left femoral sheath. We upsized to the 12 [place] sheath for the contralateral limb on the left and a 16 mm x 14 cm length left limb was selected and deployed. The main body deployment was then completed. Based off the angiographic findings, extension limbs were necessary.  An additional 16 mm x 7 cm extension was taken down to just above the hypogastric artery on the left. All junction points and seals zones were treated with the compliant balloon. The pigtail catheter was then replaced and a completion angiogram was performed.  No Endoleak was detected on completion angiography. The renal arteries were found to be widely patent. Both hypogastric arteries were widely patent . At this point we elected to terminate the procedure. We secured the pro glide devices for hemostasis on the femoral arteries. The skin incision was closed with a 4-0 Monocryl. Dermabond and pressure dressing were placed. The patient was taken to the recovery room in stable condition having  tolerated the procedure well.  COMPLICATIONS: none  CONDITION: stable  Leotis Pain  11/26/2019, 10:10 AM   This note was created with Dragon Medical transcription system. Any errors in dictation are purely unintentional.

## 2019-11-27 ENCOUNTER — Encounter: Payer: Self-pay | Admitting: Vascular Surgery

## 2019-11-27 LAB — CBC
HCT: 32.9 % — ABNORMAL LOW (ref 39.0–52.0)
Hemoglobin: 11 g/dL — ABNORMAL LOW (ref 13.0–17.0)
MCH: 31.5 pg (ref 26.0–34.0)
MCHC: 33.4 g/dL (ref 30.0–36.0)
MCV: 94.3 fL (ref 80.0–100.0)
Platelets: 125 10*3/uL — ABNORMAL LOW (ref 150–400)
RBC: 3.49 MIL/uL — ABNORMAL LOW (ref 4.22–5.81)
RDW: 13 % (ref 11.5–15.5)
WBC: 11.8 10*3/uL — ABNORMAL HIGH (ref 4.0–10.5)
nRBC: 0 % (ref 0.0–0.2)

## 2019-11-27 LAB — BASIC METABOLIC PANEL
Anion gap: 8 (ref 5–15)
BUN: 11 mg/dL (ref 8–23)
CO2: 27 mmol/L (ref 22–32)
Calcium: 9.1 mg/dL (ref 8.9–10.3)
Chloride: 106 mmol/L (ref 98–111)
Creatinine, Ser: 0.88 mg/dL (ref 0.61–1.24)
GFR calc Af Amer: >60 mL/min (ref >60)
GFR calc non Af Amer: >60 mL/min (ref >60)
Glucose, Bld: 128 mg/dL — ABNORMAL HIGH (ref 70–99)
Potassium: 4.2 mmol/L (ref 3.5–5.1)
Sodium: 141 mmol/L (ref 135–145)

## 2019-11-27 MED ORDER — CEFAZOLIN SODIUM-DEXTROSE 2-4 GM/100ML-% IV SOLN
INTRAVENOUS | Status: AC
  Administered 2019-11-27: 2 g via INTRAVENOUS
  Filled 2019-11-27: qty 100

## 2019-11-27 MED ORDER — CEFAZOLIN SODIUM-DEXTROSE 2-4 GM/100ML-% IV SOLN
INTRAVENOUS | Status: AC
  Filled 2019-11-27: qty 100

## 2019-11-27 MED ORDER — LABETALOL HCL 5 MG/ML IV SOLN
INTRAVENOUS | Status: AC
  Administered 2019-11-27: 10 mg via INTRAVENOUS
  Filled 2019-11-27: qty 4

## 2019-11-27 MED ORDER — ACETAMINOPHEN 325 MG PO TABS
ORAL_TABLET | ORAL | Status: AC
  Administered 2019-11-27: 325 mg via ORAL
  Filled 2019-11-27: qty 1

## 2019-11-27 NOTE — Progress Notes (Signed)
The last 10 ml of air removed from PADs

## 2019-11-27 NOTE — Progress Notes (Signed)
10 ML removed from bilateral PADs

## 2019-11-27 NOTE — Progress Notes (Signed)
[facility]lie [facility]  Daily Progress Note   Subjective  -   Doing well.  Very little pain. No events overnight  Objective Vitals:   11/27/19 1048 11/27/19 1103 11/27/19 1117 11/27/19 1133  BP: 134/77 (!) 141/80 (!) 162/80 (!) 153/96  Pulse: 85 87 74 79  Resp:      Temp:      TempSrc:      SpO2: 95% 93% 94% 96%  Weight:      Height:        Intake/Output Summary (Last 24 hours) at 11/27/2019 1320 Last data filed at 11/27/2019 1300 Gross per 24 hour  Intake 1665 ml  Output 3003 ml  Net -1338 ml    PULM  CTAB CV  RRR VASC  Feet warm, good pulses. Mild bruising to left groin. No significant hematoma on either side  Laboratory CBC    Component Value Date/Time   WBC 11.8 (H) 11/27/2019 0545   HGB 11.0 (L) 11/27/2019 0545   HCT 32.9 (L) 11/27/2019 0545   PLT 125 (L) 11/27/2019 0545    BMET    Component Value Date/Time   NA 141 11/27/2019 0545   K 4.2 11/27/2019 0545   CL 106 11/27/2019 0545   CO2 27 11/27/2019 0545   GLUCOSE 128 (H) 11/27/2019 0545   BUN 11 11/27/2019 0545   CREATININE 0.88 11/27/2019 0545   CALCIUM 9.1 11/27/2019 0545   GFRNONAA >60 11/27/2019 0545   GFRAA >60 11/27/2019 0545    Assessment/Planning: POD #1 s/p AAA repair   Doing well  OK to discharge today  Plan follow up in 3-4 weeks in the office    Leslie Prince  11/27/2019, 1:20 PM

## 2019-11-27 NOTE — Progress Notes (Signed)
BP Cuff size too small changed to large sbp 153. Checked accuracy by doing a manual BP SBP 152

## 2019-11-27 NOTE — Progress Notes (Signed)
Systolic BP in 163A. Labetalol administered as ordered.

## 2019-11-27 NOTE — Progress Notes (Addendum)
PADS removed bilaterally. Site unremarkable besides some bruising on the left

## 2019-11-27 NOTE — Progress Notes (Signed)
10 ml air deflated from bilateral PADs.

## 2019-11-27 NOTE — Discharge Summary (Signed)
[facility]    Discharge Summary    Patient ID:  Leslie Prince MRN: [id number] DOB/AGE: Sep 27, 1945 74 y.o.  Admit date: 11/26/2019 Discharge date: 11/27/2019 Date of Surgery: 11/26/2019 Surgeon: Surgeon(s): Schnier, Dolores Lory, MD  Admission Diagnosis: AAA (abdominal aortic aneurysm) [facility]) [I71.4]  Discharge Diagnoses:  AAA (abdominal aortic aneurysm) ([facility]) [I71.4]  Secondary Diagnoses: Past Medical History:  Diagnosis Date   Atrial fibrillation ([place])    Cancer ([facility])    prostate cancer   CHF (congestive heart failure) ([facility])    COPD (chronic obstructive pulmonary disease) ([place])    Coronary artery disease    Depression    Hypertension    Sleep apnea     Procedure(s): ENDOVASCULAR REPAIR/STENT GRAFT  Discharged Condition: good  HPI:  Has 5 cm AAA.  Here for elective repair  Hospital Course:  Leslie Prince is a 74 y.o. male is S/P Neither Procedure(s): ENDOVASCULAR REPAIR/STENT GRAFT Extubated: POD # 0 Physical exam: feet warm, mild bruising left groin, A&O Post-op wounds clean, dry, intact or healing well Pt. Ambulating, voiding and taking PO diet without difficulty. Pt pain controlled with PO pain meds. Labs as below Complications:none  Consults:    Significant Diagnostic Studies: CBC Lab Results  Component Value Date   WBC 11.8 (H) 11/27/2019   HGB 11.0 (L) 11/27/2019   HCT 32.9 (L) 11/27/2019   MCV 94.3 11/27/2019   PLT 125 (L) 11/27/2019    BMET    Component Value Date/Time   NA 141 11/27/2019 0545   K 4.2 11/27/2019 0545   CL 106 11/27/2019 0545   CO2 27 11/27/2019 0545   GLUCOSE 128 (H) 11/27/2019 0545   BUN 11 11/27/2019 0545   CREATININE 0.88 11/27/2019 0545   CALCIUM 9.1 11/27/2019 0545   GFRNONAA >60 11/27/2019 0545   GFRAA >60 11/27/2019 0545   COAG Lab Results  Component Value Date   INR 2.0 (H) 11/19/2019   INR 1.7 (H) 11/15/2018     Disposition:  Discharge to :Home Discharge  Instructions    Call MD for:  redness, tenderness, or signs of infection (pain, swelling, bleeding, redness, odor or green/yellow discharge around incision site)   Complete by: As directed    Call MD for:  severe or increased pain, loss or decreased feeling  in affected limb(s)   Complete by: As directed    Call MD for:  temperature >100.5   Complete by: As directed    Driving Restrictions   Complete by: As directed    No driving for 48 hours   Lifting restrictions   Complete by: As directed    No lifting for 24 hours   No dressing needed   Complete by: As directed    Replace only if drainage present   Resume previous diet   Complete by: As directed      Allergies as of 11/27/2019      Reactions   Lisinopril Other (See Comments)   Severe urination Other reaction(s): Other (See Comments), Other (See Comments) Severe urination      Medication List    STOP taking these medications   enoxaparin 150 MG/ML injection Commonly known as: Lovenox     TAKE these medications   Advair Diskus 250-50 MCG/DOSE Aepb Generic drug: Fluticasone-Salmeterol Inhale 1 puff into the lungs 2 (two) times daily.   allopurinol 100 MG tablet Commonly known as: ZYLOPRIM Take 100 mg by mouth daily.   aspirin EC 81 MG tablet Take 81  mg by mouth daily.   atorvastatin 20 MG tablet Commonly known as: LIPITOR Take 20 mg by mouth at bedtime.   buPROPion 150 MG 24 hr tablet Commonly known as: WELLBUTRIN XL Take 150 mg by mouth daily.   CALCIUM 600 + D PO Take by mouth 2 (two) times daily.   colchicine 0.6 MG tablet Take 0.6 mg by mouth as directed. Take 2 tablets at onset of gout flare, then 1 tablet one hour later   enalapril 20 MG tablet Commonly known as: VASOTEC Take 20 mg by mouth 2 (two) times daily.   fluticasone 50 MCG/ACT nasal spray Commonly known as: FLONASE Place into both nostrils daily.   furosemide 40 MG tablet Commonly known as: LASIX Take 40 mg by mouth daily.    Klor-Con M10 10 MEQ tablet Generic drug: potassium chloride Take 20 mEq by mouth daily.   metolazone 5 MG tablet Commonly known as: ZAROXOLYN Take 5 mg by mouth daily as needed.   metoprolol succinate 100 MG 24 hr tablet Commonly known as: TOPROL-XL Take 100 mg by mouth daily.   montelukast 10 MG tablet Commonly known as: SINGULAIR Take 10 mg by mouth at bedtime.   Multiple Vitamins tablet Take 1 tablet by mouth daily.   Pradaxa 150 MG Caps capsule Generic drug: dabigatran Take 150 mg by mouth 2 (two) times daily.   albuterol (2.5 MG/3ML) 0.083% nebulizer solution Commonly known as: PROVENTIL Take 2.5 mg by nebulization every 6 (six) hours as needed for wheezing.   ProAir HFA 108 (90 Base) MCG/ACT inhaler Generic drug: albuterol Inhale 2 puffs into the lungs every 6 (six) hours as needed for wheezing.   tamsulosin 0.4 MG Caps capsule Commonly known as: FLOMAX TAKE 1 CAPSULE (0.4 MG TOTAL) BY MOUTH DAILY AFTER SUPPER.   TART CHERRY ADVANCED PO Take by mouth.   tiotropium 18 MCG inhalation capsule Commonly known as: SPIRIVA Place 18 mcg into inhaler and inhale daily.      Verbal and written Discharge instructions given to the patient. Wound care per Discharge AVS  Follow-up Information    Kris Hartmann, NP In 3 weeks.   Specialty: Vascular Surgery Why: with EVAR duplex Contact information: [place] [address] [phone]               Signed: Leotis Pain, MD  11/27/2019, 1:27 PM

## 2019-11-27 NOTE — Progress Notes (Signed)
10 ml removed from bilateral PADs

## 2019-11-28 ENCOUNTER — Telehealth (INDEPENDENT_AMBULATORY_CARE_PROVIDER_SITE_OTHER): Payer: Self-pay

## 2019-11-28 NOTE — Telephone Encounter (Signed)
Patient's wife called stating the patient has blisters just below his incision site from his surgery. She states it sticks together, they did not state any other symptoms or concerns. Patient had Endovascular AAA repair with Dr. Delana Meyer on 11/26/19. Please advise.

## 2019-11-28 NOTE — Telephone Encounter (Signed)
Does he still have the post surgical dressing on? Sometimes that causes skin irritation.  If so, she can take it off and place a bandaid over the area.  If the blister gets bigger or doesn't resolve, we can get them on to take a look next week

## 2019-11-28 NOTE — Telephone Encounter (Signed)
Patient and wife were given the recommendations from Eulogio Ditch NP, patient's wife stated he did not come home with any dressing on and when the patient took a shower he noticed that there were small blisters just below the incision. They were advised not to use anything but a Band-Aid or plain gauze  over the wound and that if it got worse we can bring him in next week to be seen. They were also advised that if it got worse over the weekend to call the hospital and ask for the vascular surgeon on call.

## 2019-12-03 NOTE — Interval H&P Note (Signed)
History and Physical Interval Note:  12/03/2019 5:47 PM  Leslie Prince  has presented today for surgery, with the diagnosis of Endovascular stent repair    ANESTHESIA    GORE Dr Delana Meyer with Dr Lucky Cowboy to assist Covid  Aug 16.  The various methods of treatment have been discussed with the patient and family. After consideration of risks, benefits and other options for treatment, the patient has consented to  Procedure(s): ENDOVASCULAR REPAIR/STENT GRAFT (N/A) as a surgical intervention.  The patient's history has been reviewed, patient examined, no change in status, stable for surgery.  I have reviewed the patient's chart and labs.  Questions were answered to the patient's satisfaction.     [name]

## 2019-12-04 ENCOUNTER — Encounter (INDEPENDENT_AMBULATORY_CARE_PROVIDER_SITE_OTHER): Payer: Self-pay

## 2019-12-23 ENCOUNTER — Other Ambulatory Visit (INDEPENDENT_AMBULATORY_CARE_PROVIDER_SITE_OTHER): Payer: Self-pay | Admitting: Vascular Surgery

## 2019-12-23 DIAGNOSIS — I714 Abdominal aortic aneurysm, without rupture, unspecified: Secondary | ICD-10-CM

## 2019-12-26 ENCOUNTER — Encounter (INDEPENDENT_AMBULATORY_CARE_PROVIDER_SITE_OTHER): Payer: Self-pay | Admitting: Nurse Practitioner

## 2019-12-26 ENCOUNTER — Ambulatory Visit (INDEPENDENT_AMBULATORY_CARE_PROVIDER_SITE_OTHER): Payer: Medicare PPO | Admitting: Nurse Practitioner

## 2019-12-26 ENCOUNTER — Ambulatory Visit (INDEPENDENT_AMBULATORY_CARE_PROVIDER_SITE_OTHER): Payer: Medicare PPO

## 2019-12-26 ENCOUNTER — Other Ambulatory Visit: Payer: Self-pay

## 2019-12-26 VITALS — BP 134/64 | HR 84 | Ht 69.0 in | Wt 274.0 lb

## 2019-12-26 DIAGNOSIS — B159 Hepatitis A without hepatic coma: Secondary | ICD-10-CM | POA: Insufficient documentation

## 2019-12-26 DIAGNOSIS — I714 Abdominal aortic aneurysm, without rupture, unspecified: Secondary | ICD-10-CM

## 2019-12-26 DIAGNOSIS — Z9109 Other allergy status, other than to drugs and biological substances: Secondary | ICD-10-CM | POA: Insufficient documentation

## 2019-12-26 DIAGNOSIS — E785 Hyperlipidemia, unspecified: Secondary | ICD-10-CM

## 2019-12-26 DIAGNOSIS — I059 Rheumatic mitral valve disease, unspecified: Secondary | ICD-10-CM | POA: Insufficient documentation

## 2019-12-26 DIAGNOSIS — I10 Essential (primary) hypertension: Secondary | ICD-10-CM

## 2019-12-29 ENCOUNTER — Encounter: Payer: Self-pay | Admitting: Cardiology

## 2019-12-29 ENCOUNTER — Ambulatory Visit: Payer: Medicare PPO | Admitting: Cardiology

## 2019-12-29 ENCOUNTER — Other Ambulatory Visit: Payer: Self-pay

## 2019-12-29 VITALS — BP 140/84 | HR 74 | Ht 69.0 in | Wt 273.4 lb

## 2019-12-29 DIAGNOSIS — I4821 Permanent atrial fibrillation: Secondary | ICD-10-CM

## 2019-12-29 DIAGNOSIS — I77810 Thoracic aortic ectasia: Secondary | ICD-10-CM

## 2019-12-29 DIAGNOSIS — I714 Abdominal aortic aneurysm, without rupture, unspecified: Secondary | ICD-10-CM

## 2019-12-29 DIAGNOSIS — Z951 Presence of aortocoronary bypass graft: Secondary | ICD-10-CM | POA: Diagnosis not present

## 2019-12-29 DIAGNOSIS — I10 Essential (primary) hypertension: Secondary | ICD-10-CM | POA: Diagnosis not present

## 2019-12-29 NOTE — Patient Instructions (Signed)

## 2019-12-29 NOTE — Progress Notes (Signed)
Cardiology Office Note:    Date:  12/29/2019   ID:  [name], DOB Jun 29, 1945, MRN [id number]  PCP:  [name], MD  [facility] Cardiologist:  Kate Sable, MD  [place] Electrophysiologist:  None   Referring MD: [name], MD   Chief Complaint  Patient presents with  . office visit    1 month F/U; Meds verbally reviewed with patient.    History of Present Illness:    Leslie Prince is a 74 y.o. male with a hx of chronic atrial fibrillation on Pradaxa, CAD PCI w/ DES to LCx 2011, CABG x 3 in 1997, mitral valve repair 1997, AAA s/p endovascular stent repair 11/2019, hypertension, former smoker, COPD who presents for follow-up.    Patient being seen due to atrial fibrillation and hypertension.  Currently denies chest pain or shortness of breath.  BP usually normal at home 734K to 876O systolic.  Has no concerns today.  Successfully underwent endovascular repair of his AAA last month.  Followed up with vascular surgery and ultrasound did not show any endoleak's.  Checks his blood pressure frequently at home, systolic usually 115-726O.  Has been eating healthier, low-salt diet, less sugar, lost about 3 pounds since last visit.  Overall he feels well, has no concerns at this time.  Prior notes Patient had a recent abdominal CT showing abdominal aortic aneurysm up to 5 cm.  Endovascular repair by vascular surgery was performed on 11/27/2019.  Patient tolerated procedure you without any adverse effects.   Last echocardiogram showed low normal ejection fraction, EF 50 to 55%.   Past Medical History:  Diagnosis Date  . Atrial fibrillation ([place])   . Cancer [facility])    prostate cancer  . CHF (congestive heart failure) ([place])   . COPD (chronic obstructive pulmonary disease) ([place])   . Coronary artery disease   . Depression   . Hypertension   . Sleep apnea     Past Surgical History:  Procedure Laterality Date  . CARDIAC SURGERY    . CARDIAC VALVE REPLACEMENT     . CARDIOVERSION  2013  . CATARACT EXTRACTION W/PHACO Right 08/06/2019   Procedure: CATARACT EXTRACTION PHACO AND INTRAOCULAR LENS PLACEMENT (IOC) RIGHT 12.34  01:25.4  14.5%;  Surgeon: Leandrew Koyanagi, MD;  Location: [place];  Service: Ophthalmology;  Laterality: Right;  . CATARACT EXTRACTION W/PHACO Left 08/27/2019   Procedure: CATARACT EXTRACTION PHACO AND INTRAOCULAR LENS PLACEMENT (IOC) LEFT 7.92 01:16.9 10.3%;  Surgeon: Leandrew Koyanagi, MD;  Location: [place];  Service: Ophthalmology;  Laterality: Left;  sleep apnea  . COLONOSCOPY WITH PROPOFOL N/A 11/07/2017   Procedure: COLONOSCOPY WITH PROPOFOL;  Surgeon: Manya Silvas, MD;  Location: [facility] ENDOSCOPY;  Service: Endoscopy;  Laterality: N/A;  . CORONARY ANGIOPLASTY WITH STENT PLACEMENT  2011  . CORONARY ARTERY BYPASS GRAFT     during mitral valve repair  . ENDOVASCULAR REPAIR/STENT GRAFT N/A 11/26/2019   Procedure: ENDOVASCULAR REPAIR/STENT GRAFT;  Surgeon: Katha Cabal, MD;  Location: [place] CV LAB;  Service: Cardiovascular;  Laterality: N/A;  . MITRAL VALVE REPAIR  1997    Current Medications: Current Meds  Medication Sig  . albuterol (PROAIR HFA) 108 (90 Base) MCG/ACT inhaler Inhale 2 puffs into the lungs every 6 (six) hours as needed for wheezing.   Marland Kitchen albuterol (PROVENTIL) (2.5 MG/3ML) 0.083% nebulizer solution Take 2.5 mg by nebulization every 6 (six) hours as needed for wheezing.  Marland Kitchen allopurinol (ZYLOPRIM) 100 MG tablet Take 100 mg by  mouth daily.  Marland Kitchen aspirin EC 81 MG tablet Take 81 mg by mouth daily.   Marland Kitchen atorvastatin (LIPITOR) 20 MG tablet Take 20 mg by mouth at bedtime.   Marland Kitchen buPROPion (WELLBUTRIN XL) 150 MG 24 hr tablet Take 150 mg by mouth daily.  . Calcium Carb-Cholecalciferol (CALCIUM 600 + D PO) Take by mouth 2 (two) times daily.  . colchicine 0.6 MG tablet Take 0.6 mg by mouth as directed. Take 2 tablets at onset of gout flare, then 1 tablet one hour later  . dabigatran  (PRADAXA) 150 MG CAPS capsule Take 150 mg by mouth 2 (two) times daily.   . enalapril (VASOTEC) 20 MG tablet Take 20 mg by mouth 2 (two) times daily.   . fluticasone (FLONASE) 50 MCG/ACT nasal spray Place into both nostrils daily.  . Fluticasone-Salmeterol (ADVAIR DISKUS) 250-50 MCG/DOSE AEPB Inhale 1 puff into the lungs 2 (two) times daily.   . furosemide (LASIX) 40 MG tablet Take 40 mg by mouth daily.   Marland Kitchen KLOR-CON M10 10 MEQ tablet Take 20 mEq by mouth daily.   . metolazone (ZAROXOLYN) 5 MG tablet Take 5 mg by mouth daily as needed.   . metoprolol succinate (TOPROL-XL) 100 MG 24 hr tablet Take 100 mg by mouth daily.  . Misc Natural Products (TART CHERRY ADVANCED PO) Take by mouth daily.   . montelukast (SINGULAIR) 10 MG tablet Take 10 mg by mouth at bedtime.   . Multiple Vitamins tablet Take 1 tablet by mouth daily.   . mupirocin ointment (BACTROBAN) 2 % Apply topically 3 (three) times daily.  . tamsulosin (FLOMAX) 0.4 MG CAPS capsule TAKE 1 CAPSULE (0.4 MG TOTAL) BY MOUTH DAILY AFTER SUPPER.  Marland Kitchen tiotropium (SPIRIVA) 18 MCG inhalation capsule Place 18 mcg into inhaler and inhale daily.     Allergies:   Lisinopril   Social History   Socioeconomic History  . Marital status: Married    Spouse name: Not on file  . Number of children: Not on file  . Years of education: Not on file  . Highest education level: Not on file  Occupational History  . Not on file  Tobacco Use  . Smoking status: Former Smoker    Packs/day: 1.00    Years: 25.00    Pack years: 25.00    Types: Cigarettes    Quit date: 06/21/1995    Years since quitting: 24.5  . Smokeless tobacco: Never Used  Vaping Use  . Vaping Use: Never used  Substance and Sexual Activity  . Alcohol use: Not Currently    Comment: Rare  . Drug use: Never  . Sexual activity: Not on file  Other Topics Concern  . Not on file  Social History Narrative  . Not on file   Social Determinants of Health   Financial Resource Strain:   .  Difficulty of Paying Living Expenses: Not on file  Food Insecurity:   . Worried About Charity fundraiser in the Last Year: Not on file  . Ran Out of Food in the Last Year: Not on file  Transportation Needs:   . Lack of Transportation (Medical): Not on file  . Lack of Transportation (Non-Medical): Not on file  Physical Activity:   . Days of Exercise per Week: Not on file  . Minutes of Exercise per Session: Not on file  Stress:   . Feeling of Stress : Not on file  Social Connections:   . Frequency of Communication with Friends and Family: Not on  file  . Frequency of Social Gatherings with Friends and Family: Not on file  . Attends Religious Services: Not on file  . Active Member of Clubs or Organizations: Not on file  . Attends Archivist Meetings: Not on file  . Marital Status: Not on file     Family History: The patient's family history includes Hypertension in his mother; Valvular heart disease in his father.  ROS:   Please see the history of present illness.     All other systems reviewed and are negative.  EKGs/Labs/Other Studies Reviewed:    The following studies were reviewed today:   EKG:  EKG not  ordered today.    Recent Labs: 11/27/2019: BUN 11; Creatinine, Ser 0.88; Hemoglobin 11.0; Platelets 125; Potassium 4.2; Sodium 141  Recent Lipid Panel No results found for: CHOL, TRIG, HDL, CHOLHDL, VLDL, LDLCALC, LDLDIRECT  Physical Exam:    VS:  BP 140/84 (BP Location: Left Arm, Patient Position: Sitting, Cuff Size: Large)   Pulse 74   Ht 5\' 9"  (1.753 m)   Wt 273 lb 6 oz (124 kg)   SpO2 97%   BMI 40.37 kg/m     Wt Readings from Last 3 Encounters:  12/29/19 273 lb 6 oz (124 kg)  12/26/19 274 lb (124.3 kg)  11/26/19 270 lb (122.5 kg)     GEN:  Well nourished, well developed in no acute distress HEENT: Normal NECK: No JVD; No carotid bruits LYMPHATICS: No lymphadenopathy CARDIAC: Irregular irregular, no murmurs, rubs, gallops RESPIRATORY:  Clear  to auscultation without rales, wheezing or rhonchi  ABDOMEN: Soft, non-tender, non-distended MUSCULOSKELETAL:  No edema; No deformity  SKIN: Warm and dry NEUROLOGIC:  Alert and oriented x 3 PSYCHIATRIC:  Normal affect   ASSESSMENT:    1. Hx of CABG   2. Permanent atrial fibrillation ([place])   3. Essential hypertension   4. AAA (abdominal aortic aneurysm) without rupture ([place])   5. Ascending aorta dilatation (HCC)    PLAN:    In order of problems listed above:  1. Patient with history of CAD/CABG, PCI to the left circumflex.  Denies any anginal symptoms.  Echo on 11/2019 showed normal systolic function, EF 50 to 55%. statin, beta-blocker, Pradaxa  2. History of permanent atrial fibrillation, heart rate controlled.  Toprol-XL and Pradaxa.  3. History of hypertension, BP usually controlled at home between low 256L to 893T systolic.  Continue Toprol, enalapril.  Low-salt diet advised.  Encouraged on weight loss, advised on low calorie diet. 4. Abdominal aortic aneurysm.  Up to 5 cm, s/p endovascular stent 11/27/2019.  Being followed closely by vascular surgery.  Keep appointments. 5. 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 will monitor with serial echoes every 1 to 2 years as per ACC guidelines.  Follow-up in 6 months  Total encounter 40 minutes  Greater than 50% was spent in counseling and coordination of care with the patient  Medication Adjustments/Labs and Tests Ordered: Current medicines are reviewed at length with the patient today.  Concerns regarding medicines are outlined above.  No orders of the defined types were placed in this encounter.  No orders of the defined types were placed in this encounter.   Patient Instructions  Medication Instructions:  Your physician recommends that you continue on your current medications as directed. Please refer to the Current Medication list given to you today.  *If you need a refill on your cardiac medications before your next  appointment, please call your pharmacy*   Lab Work: None  Ordered If you have labs (blood work) drawn today and your tests are completely normal, you will receive your results only by: Marland Kitchen MyChart Message (if you have MyChart) OR . A paper copy in the mail If you have any lab test that is abnormal or we need to change your treatment, we will call you to review the results.   Testing/Procedures: None Ordered   Follow-Up: At [facility], you and your health needs are our priority.  As part of our continuing mission to provide you with exceptional heart care, we have created designated Provider Care Teams.  These Care Teams include your primary Cardiologist (physician) and Advanced Practice Providers (APPs -  Physician Assistants and Nurse Practitioners) who all work together to provide you with the care you need, when you need it.  We recommend signing up for the patient portal called "MyChart".  Sign up information is provided on this After Visit Summary.  MyChart is used to connect with patients for Virtual Visits (Telemedicine).  Patients are able to view lab/test results, encounter notes, upcoming appointments, etc.  Non-urgent messages can be sent to your provider as well.   To learn more about what you can do with MyChart, go to [email or web address].    Your next appointment:   6 month(s)  The format for your next appointment:   In Person  Provider:   Kate Sable, MD   Other Instructions      Signed, Kate Sable, MD  12/29/2019 1:09 PM    [place]

## 2019-12-31 ENCOUNTER — Encounter (INDEPENDENT_AMBULATORY_CARE_PROVIDER_SITE_OTHER): Payer: Self-pay | Admitting: Nurse Practitioner

## 2019-12-31 NOTE — Progress Notes (Signed)
Subjective:    Patient ID: Leslie Prince, male    DOB: May 12, 1945, 74 y.o.   MRN: [id number] Chief Complaint  Patient presents with  . Follow-up    3 week post endovascular repair stent graft     The patient returns to the office for surveillance of an abdominal aortic aneurysm status post stent graft placement on 11/26/2019.   Patient denies abdominal pain or back pain, no other abdominal complaints. No groin related complaints. No symptoms consistent with distal embolization No changes in claudication distance.   There have been no interval changes in his overall healthcare since his last visit.   Patient denies amaurosis fugax or TIA symptoms. There is no history of claudication or rest pain symptoms of the lower extremities. The patient denies angina or shortness of breath.   Duplex US of the aorta and iliac arteries shows a 4.5 AAA sac with no  endoleak, no change in the sac compared to the previous study.   Review of Systems  Neurological: Negative for weakness.  All other systems reviewed and are negative.      Objective:   Physical Exam Vitals reviewed.  HENT:     Head: Normocephalic.  Cardiovascular:     Rate and Rhythm: Normal rate and regular rhythm.     Pulses: Normal pulses.     Heart sounds: Normal heart sounds.  Pulmonary:     Effort: Pulmonary effort is normal.     Breath sounds: Normal breath sounds.  Skin:    General: Skin is warm and dry.  Neurological:     Mental Status: He is alert and oriented to person, place, and time.  Psychiatric:        Mood and Affect: Mood normal.        Behavior: Behavior normal.        Thought Content: Thought content normal.        Judgment: Judgment normal.     BP 134/64   Pulse 84   Ht 5\' 9"  (1.753 m)   Wt 274 lb (124.3 kg)   BMI 40.46 kg/m   Past Medical History:  Diagnosis Date  . Atrial fibrillation ([place])   . Cancer [facility])    prostate cancer  . CHF (congestive heart failure) ([place])   . COPD (chronic  obstructive pulmonary disease) ([place])   . Coronary artery disease   . Depression   . Hypertension   . Sleep apnea     Social History   Socioeconomic History  . Marital status: Married    Spouse name: Not on file  . Number of children: Not on file  . Years of education: Not on file  . Highest education level: Not on file  Occupational History  . Not on file  Tobacco Use  . Smoking status: Former Smoker    Packs/day: 1.00    Years: 25.00    Pack years: 25.00    Types: Cigarettes    Quit date: 06/21/1995    Years since quitting: 24.5  . Smokeless tobacco: Never Used  Vaping Use  . Vaping Use: Never used  Substance and Sexual Activity  . Alcohol use: Not Currently    Comment: Rare  . Drug use: Never  . Sexual activity: Not on file  Other Topics Concern  . Not on file  Social History Narrative  . Not on file   Social Determinants of Health   Financial Resource Strain:   . Difficulty of Paying Living Expenses: Not on  file  Food Insecurity:   . Worried About Charity fundraiser in the Last Year: Not on file  . Ran Out of Food in the Last Year: Not on file  Transportation Needs:   . Lack of Transportation (Medical): Not on file  . Lack of Transportation (Non-Medical): Not on file  Physical Activity:   . Days of Exercise per Week: Not on file  . Minutes of Exercise per Session: Not on file  Stress:   . Feeling of Stress : Not on file  Social Connections:   . Frequency of Communication with Friends and Family: Not on file  . Frequency of Social Gatherings with Friends and Family: Not on file  . Attends Religious Services: Not on file  . Active Member of Clubs or Organizations: Not on file  . Attends Archivist Meetings: Not on file  . Marital Status: Not on file  Intimate Partner Violence:   . Fear of Current or Ex-Partner: Not on file  . Emotionally Abused: Not on file  . Physically Abused: Not on file  . Sexually Abused: Not on file    Past Surgical  History:  Procedure Laterality Date  . CARDIAC SURGERY    . CARDIAC VALVE REPLACEMENT    . CARDIOVERSION  2013  . CATARACT EXTRACTION W/PHACO Right 08/06/2019   Procedure: CATARACT EXTRACTION PHACO AND INTRAOCULAR LENS PLACEMENT (IOC) RIGHT 12.34  01:25.4  14.5%;  Surgeon: Leandrew Koyanagi, MD;  Location: [place];  Service: Ophthalmology;  Laterality: Right;  . CATARACT EXTRACTION W/PHACO Left 08/27/2019   Procedure: CATARACT EXTRACTION PHACO AND INTRAOCULAR LENS PLACEMENT (IOC) LEFT 7.92 01:16.9 10.3%;  Surgeon: Leandrew Koyanagi, MD;  Location: [place];  Service: Ophthalmology;  Laterality: Left;  sleep apnea  . COLONOSCOPY WITH PROPOFOL N/A 11/07/2017   Procedure: COLONOSCOPY WITH PROPOFOL;  Surgeon: Manya Silvas, MD;  Location: [facility] ENDOSCOPY;  Service: Endoscopy;  Laterality: N/A;  . CORONARY ANGIOPLASTY WITH STENT PLACEMENT  2011  . CORONARY ARTERY BYPASS GRAFT     during mitral valve repair  . ENDOVASCULAR REPAIR/STENT GRAFT N/A 11/26/2019   Procedure: ENDOVASCULAR REPAIR/STENT GRAFT;  Surgeon: Katha Cabal, MD;  Location: [place] CV LAB;  Service: Cardiovascular;  Laterality: N/A;  . MITRAL VALVE REPAIR  1997    Family History  Problem Relation Age of Onset  . Hypertension Mother   . Valvular heart disease Father     Allergies  Allergen Reactions  . Lisinopril Other (See Comments)    Severe urination Other reaction(s): Other (See Comments), Other (See Comments) Severe urination        Assessment & Plan:   1. Abdominal aortic aneurysm (AAA) without rupture (HCC) Recommend: Patient is status post successful endovascular repair of the AAA.   No further intervention is required at this time.   No endoleak is detected and the aneurysm sac is stable.  The patient will continue antiplatelet therapy as prescribed as well as aggressive management of hyperlipidemia. Exercise is again strongly encouraged.   However, endografts  require continued surveillance with ultrasound or CT scan. This is mandatory to detect any changes that allow repressurization of the aneurysm sac.  The patient is informed that this would be asymptomatic.  The patient is reminded that lifelong routine surveillance is a necessity with an endograft. Patient will continue to follow-up at 6 month intervals with ultrasound of the aorta.  2. Hyperlipidemia, unspecified hyperlipidemia type Continue statin as ordered and reviewed, no changes at this time  3. Essential hypertension Continue antihypertensive medications as already ordered, these medications have been reviewed and there are no changes at this time.    Current Outpatient Medications on File Prior to Visit  Medication Sig Dispense Refill  . albuterol (PROAIR HFA) 108 (90 Base) MCG/ACT inhaler Inhale 2 puffs into the lungs every 6 (six) hours as needed for wheezing.     Marland Kitchen albuterol (PROVENTIL) (2.5 MG/3ML) 0.083% nebulizer solution Take 2.5 mg by nebulization every 6 (six) hours as needed for wheezing.    Marland Kitchen allopurinol (ZYLOPRIM) 100 MG tablet Take 100 mg by mouth daily.    Marland Kitchen aspirin EC 81 MG tablet Take 81 mg by mouth daily.     Marland Kitchen atorvastatin (LIPITOR) 20 MG tablet Take 20 mg by mouth at bedtime.   1  . buPROPion (WELLBUTRIN XL) 150 MG 24 hr tablet Take 150 mg by mouth daily.    . Calcium Carb-Cholecalciferol (CALCIUM 600 + D PO) Take by mouth 2 (two) times daily.    . colchicine 0.6 MG tablet Take 0.6 mg by mouth as directed. Take 2 tablets at onset of gout flare, then 1 tablet one hour later    . dabigatran (PRADAXA) 150 MG CAPS capsule Take 150 mg by mouth 2 (two) times daily.     . enalapril (VASOTEC) 20 MG tablet Take 20 mg by mouth 2 (two) times daily.     . fluticasone (FLONASE) 50 MCG/ACT nasal spray Place into both nostrils daily.    . Fluticasone-Salmeterol (ADVAIR DISKUS) 250-50 MCG/DOSE AEPB Inhale 1 puff into the lungs 2 (two) times daily.     . furosemide (LASIX) 40 MG  tablet Take 40 mg by mouth daily.     Marland Kitchen KLOR-CON M10 10 MEQ tablet Take 20 mEq by mouth daily.   4  . metolazone (ZAROXOLYN) 5 MG tablet Take 5 mg by mouth daily as needed.     . metoprolol succinate (TOPROL-XL) 100 MG 24 hr tablet Take 100 mg by mouth daily.    . Misc Natural Products (TART CHERRY ADVANCED PO) Take by mouth daily.     . montelukast (SINGULAIR) 10 MG tablet Take 10 mg by mouth at bedtime.   3  . Multiple Vitamins tablet Take 1 tablet by mouth daily.     . mupirocin ointment (BACTROBAN) 2 % Apply topically 3 (three) times daily.    . tamsulosin (FLOMAX) 0.4 MG CAPS capsule TAKE 1 CAPSULE (0.4 MG TOTAL) BY MOUTH DAILY AFTER SUPPER. 90 capsule 3  . tiotropium (SPIRIVA) 18 MCG inhalation capsule Place 18 mcg into inhaler and inhale daily.     No current facility-administered medications on file prior to visit.    There are no Patient Instructions on file for this visit. No follow-ups on file.   Kris Hartmann, NP

## 2020-02-26 ENCOUNTER — Encounter: Payer: Self-pay | Admitting: *Deleted

## 2020-03-16 IMAGING — CT CT ABD-PELV W/ CM
2 of 5 series · 15 of 46 positions shown, 17 images · IV contrast (APPLIED)
Comparison: None.

CLINICAL DATA: 72-year-old with recent diagnosis of prostate
cancer. Initial staging.

EXAM:
CT ABDOMEN AND PELVIS WITH CONTRAST
TECHNIQUE: Multidetector CT imaging of the abdomen and pelvis was performed
using the standard protocol following bolus administration of
intravenous contrast.
CONTRAST:  100mL [id number] IOPAMIDOL INJECTION 76% IV. Oral
contrast was also administered.

[Series 2: routine abd/pel with · axial · 0.98mm/px · z∈[-567,-72]mm · 12 of 111 slices shown, 14 images]
[im 6/111  soft-tissue]
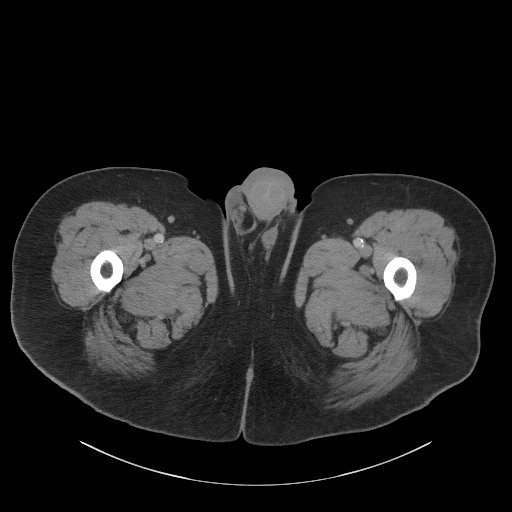
[im 6/111  bone]
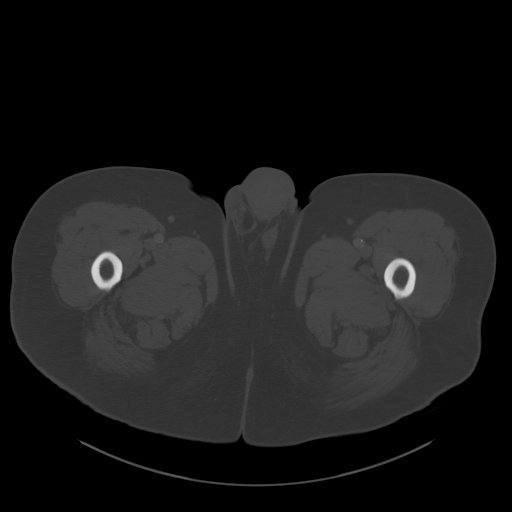
[im 18/111  soft-tissue]
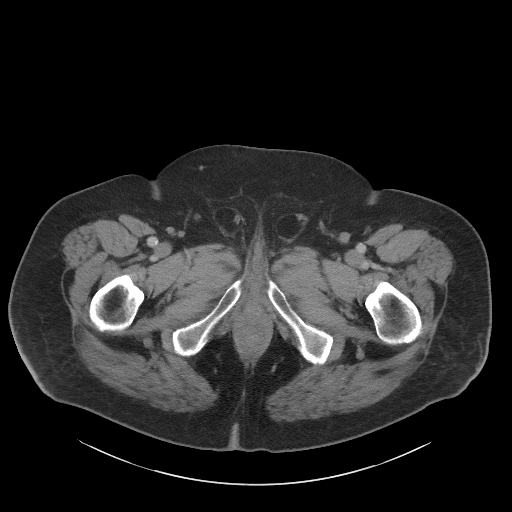
[im 24/111  soft-tissue]
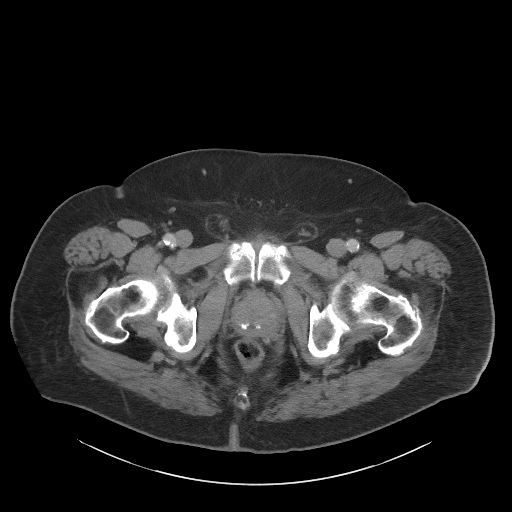
[im 35/111  soft-tissue]
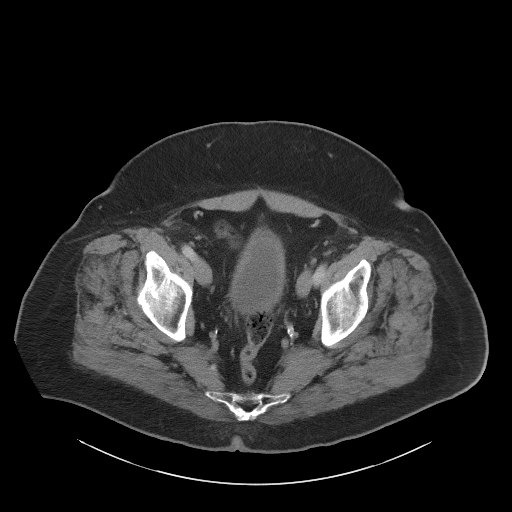
[im 41/111  soft-tissue]
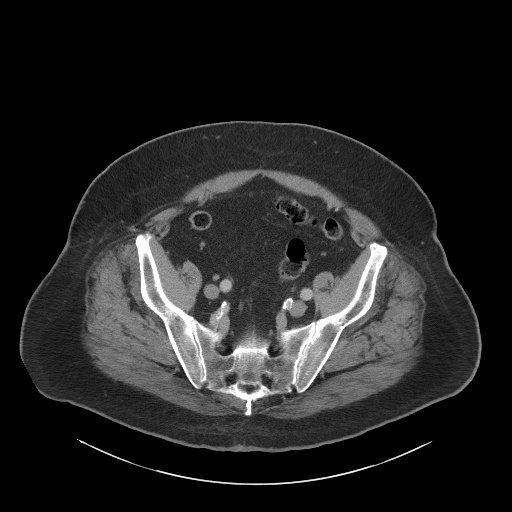
[im 53/111  soft-tissue]
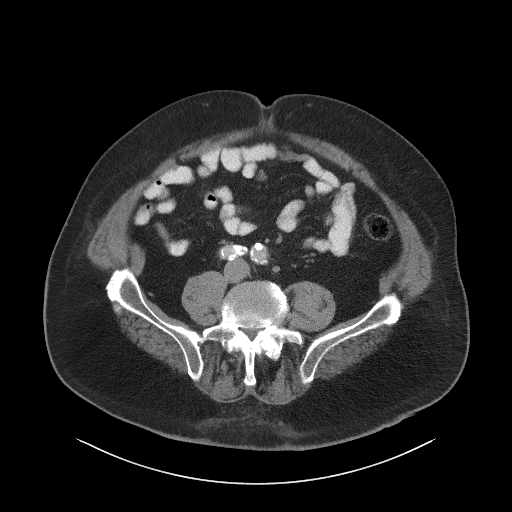
[im 58/111  soft-tissue]
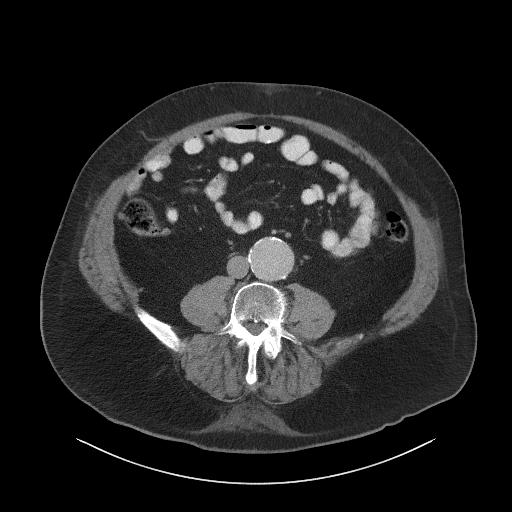
[im 70/111  soft-tissue]
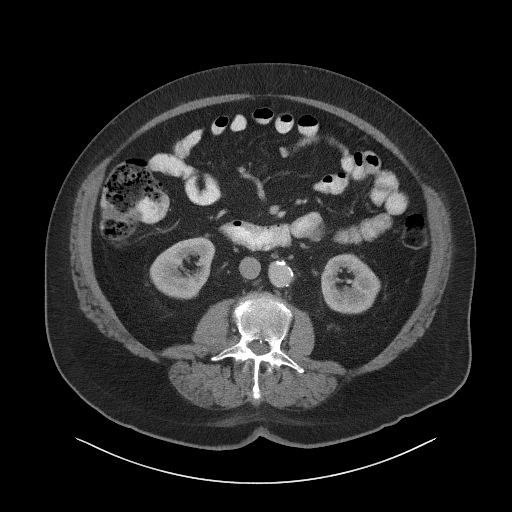
[im 76/111  soft-tissue]
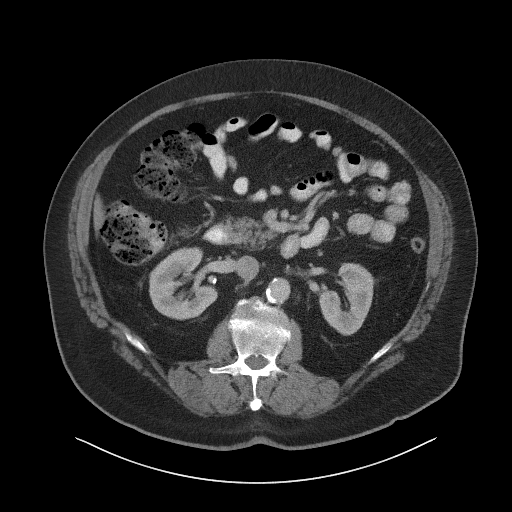
[im 76/111  bone]
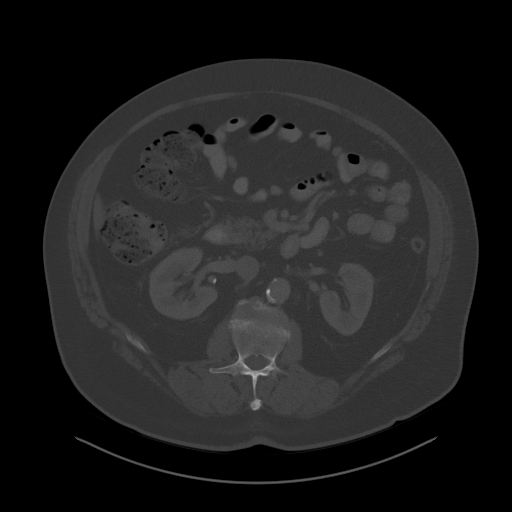
[im 87/111  soft-tissue]
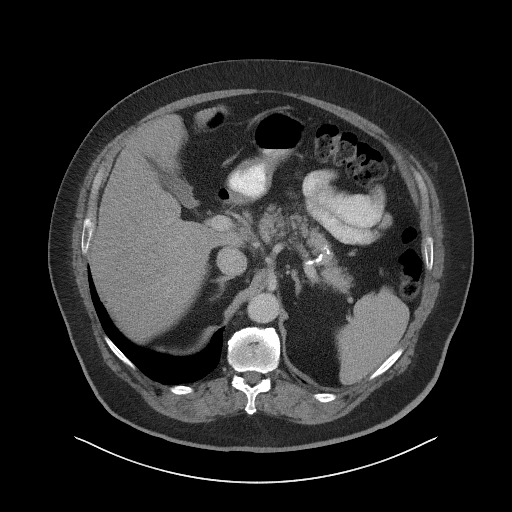
[im 93/111  soft-tissue]
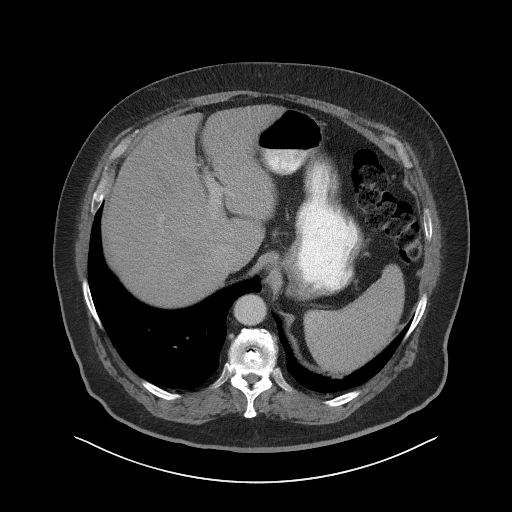
[im 105/111  soft-tissue]
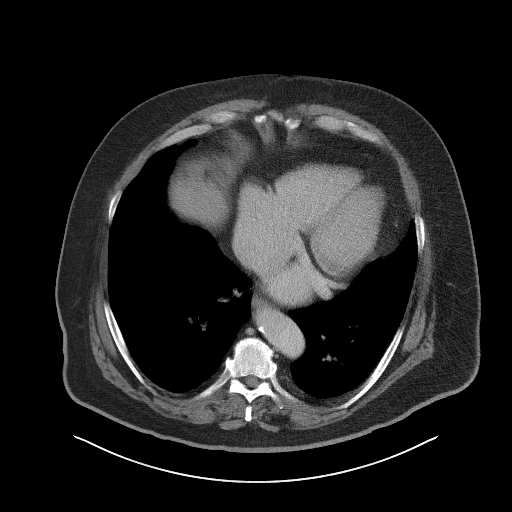

[Series 5: coronal st · coronal · 0.93mm/px · 3 of 122 slices shown]
[im 41/122  soft-tissue]
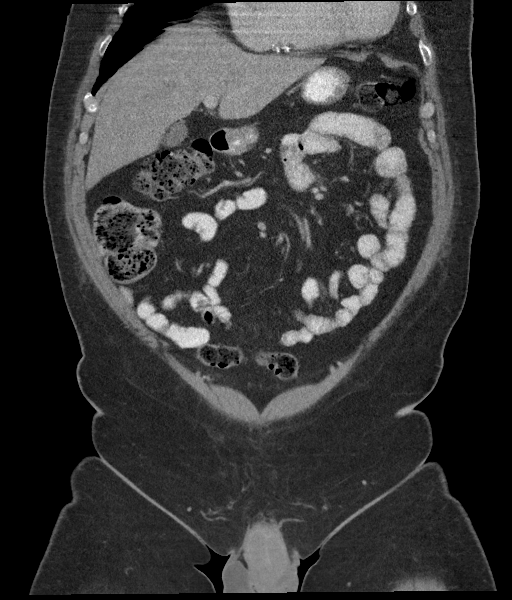
[im 54/122  soft-tissue]
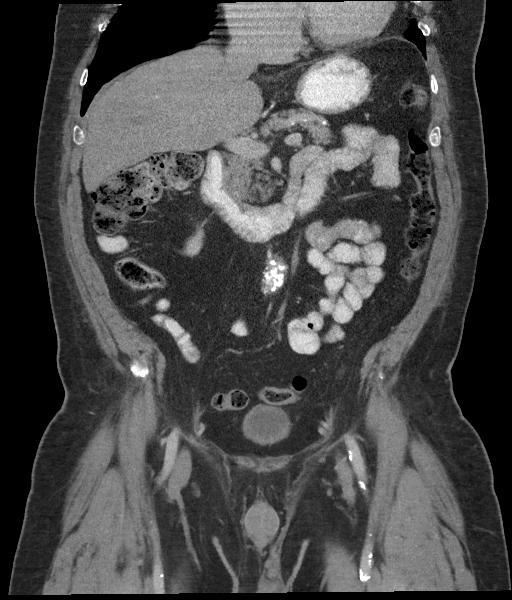
[im 68/122  soft-tissue]
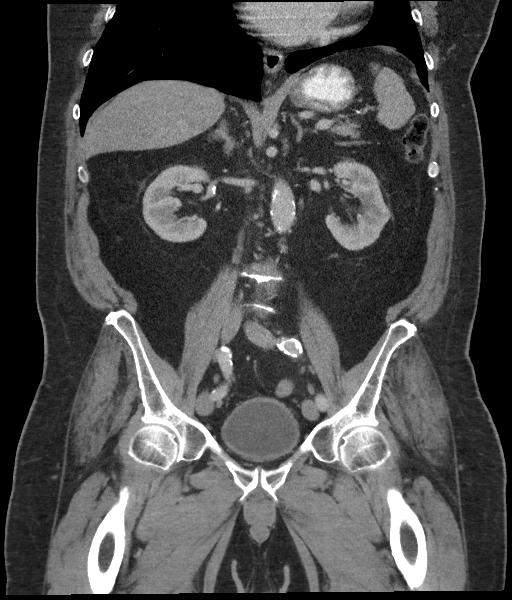

[15 of 46 positions shown; findings below may reference images not displayed]

FINDINGS: Lower chest: Minimal dependent atelectasis in the LEFT LOWER LOBE.
Calcified granuloma in the RIGHT MIDDLE LOBE. Visualized lung bases
otherwise clear. Heart markedly enlarged. Prior mitral valve
replacement. RIGHT coronary atherosclerosis. No pericardial
effusion.

Hepatobiliary: Liver normal in size and appearance. Gallbladder
normal in appearance without calcified gallstones. No biliary ductal
dilation.

Pancreas: Normal in appearance without evidence of mass, ductal
dilation, or inflammation.

Spleen: Normal in size and appearance.

Adrenals/Urinary Tract: Normal appearing adrenal glands. Kidneys
normal in size and appearance without focal parenchymal abnormality.
No hydronephrosis. No evidence of urinary tract calculi. Normal
appearing decompressed urinary bladder.

Stomach/Bowel: Very small hiatal hernia. Stomach otherwise normal in
appearance. Normal-appearing small bowel. Expected stool burden
throughout normal appearing colon. Normal appendix in the RIGHT mid
abdomen.

Vascular/Lymphatic: Moderate aortoiliofemoral atherosclerosis.
Saccular infrarenal abdominal aortic aneurysm measuring
approximately 4.6 cm diameter. Normal-appearing portal venous and
systemic venous systems.

Normal sized lymph nodes in both internal iliac and both external
iliac chains. No pathologic lymphadenopathy.

Reproductive: Enlarged prostate gland with irregular contour,
containing calcifications. No evidence of extension of prostatic
tissue to the pelvic sidewall. Normal-appearing seminal vesicles.

Other: Small RIGHT inguinal hernia containing fat.

Musculoskeletal: Degenerative disc disease and spondylosis
throughout the LOWER thoracic spine. Degenerative disc disease at
L2-3. Facet degenerative changes diffusely throughout the lumbar
spine. Congenitally short pedicles. Severe multifactorial spinal
stenosis at L2-3 and L4-5 and moderate multifactorial spinal
stenosis at L3-4. Ankylosis of the sacroiliac joints. Mild
degenerative changes involving the hip joints. No evidence of
osseous metastatic disease.
IMPRESSION: 1. No evidence of metastatic disease in the abdomen or pelvis.
Normal sized BILATERAL internal iliac and external iliac nodes.
2. Saccular infrarenal abdominal aortic aneurysm measuring
approximately 4.6 cm diameter. Recommend followup by abdomen and
pelvis CTA in 6 months, and vascular surgery referral/consultation
if not already obtained. This recommendation follows ACR consensus
guidelines: White Paper of the ACR Incidental Findings Committee II
on Vascular Findings. [HOSPITAL] 2471; [DATE].
3. Enlarged prostate gland without evidence of extension of
prostatic tissue to the pelvic sidewall.
4. Severe multifactorial spinal stenosis at L2-3 and L4-5 and
moderate multifactorial spinal stenosis at L3-4. No evidence of
osseous metastatic disease.
5. Very small hiatal hernia.
6. Small RIGHT inguinal hernia containing fat.

Aortic Atherosclerosis (UJBO8-3I1.1).

Aortic aneurysm NOS (UJBO8-YA3.D).

## 2020-03-30 ENCOUNTER — Other Ambulatory Visit: Payer: Self-pay

## 2020-03-30 MED ORDER — ENALAPRIL MALEATE 20 MG PO TABS
20.0000 mg | ORAL_TABLET | Freq: Two times a day (BID) | ORAL | 1 refills | Status: DC
Start: 2020-03-30 — End: 2020-08-23

## 2020-04-20 ENCOUNTER — Encounter: Payer: Self-pay | Admitting: Radiation Oncology

## 2020-04-22 ENCOUNTER — Other Ambulatory Visit: Payer: Medicare PPO

## 2020-04-28 ENCOUNTER — Ambulatory Visit: Payer: Medicare PPO | Admitting: Urology

## 2020-05-05 ENCOUNTER — Other Ambulatory Visit: Payer: Self-pay | Admitting: Pulmonary Disease

## 2020-05-05 DIAGNOSIS — N402 Nodular prostate without lower urinary tract symptoms: Secondary | ICD-10-CM

## 2020-05-05 DIAGNOSIS — R911 Solitary pulmonary nodule: Secondary | ICD-10-CM

## 2020-05-19 ENCOUNTER — Other Ambulatory Visit: Payer: Self-pay

## 2020-05-19 ENCOUNTER — Ambulatory Visit
Admission: RE | Admit: 2020-05-19 | Discharge: 2020-05-19 | Disposition: A | Discharge status: Home / Self Care | Payer: Medicare PPO | Source: Ambulatory Visit | Attending: Pulmonary Disease | Admitting: Pulmonary Disease

## 2020-05-19 DIAGNOSIS — N402 Nodular prostate without lower urinary tract symptoms: Secondary | ICD-10-CM | POA: Diagnosis not present

## 2020-05-19 DIAGNOSIS — R911 Solitary pulmonary nodule: Secondary | ICD-10-CM | POA: Insufficient documentation

## 2020-05-21 ENCOUNTER — Encounter: Payer: Self-pay | Admitting: Radiation Oncology

## 2020-05-21 ENCOUNTER — Encounter (INDEPENDENT_AMBULATORY_CARE_PROVIDER_SITE_OTHER): Payer: Self-pay

## 2020-05-24 ENCOUNTER — Other Ambulatory Visit: Payer: Medicare PPO

## 2020-05-31 ENCOUNTER — Ambulatory Visit: Payer: Medicare PPO | Admitting: Radiation Oncology

## 2020-05-31 ENCOUNTER — Ambulatory Visit: Payer: Medicare PPO | Admitting: Urology

## 2020-06-02 ENCOUNTER — Other Ambulatory Visit: Payer: Self-pay

## 2020-06-02 ENCOUNTER — Encounter: Payer: Self-pay | Admitting: Urology

## 2020-06-02 ENCOUNTER — Ambulatory Visit
Admission: RE | Admit: 2020-06-02 | Discharge: 2020-06-02 | Disposition: A | Discharge status: Home / Self Care | Payer: Medicare PPO | Source: Ambulatory Visit | Attending: Radiation Oncology | Admitting: Radiation Oncology

## 2020-06-02 ENCOUNTER — Encounter: Payer: Self-pay | Admitting: Radiation Oncology

## 2020-06-02 ENCOUNTER — Ambulatory Visit: Payer: Medicare PPO | Admitting: Urology

## 2020-06-02 VITALS — BP 131/84 | HR 75 | Ht 69.0 in | Wt 269.0 lb

## 2020-06-02 DIAGNOSIS — N3281 Overactive bladder: Secondary | ICD-10-CM | POA: Diagnosis not present

## 2020-06-02 DIAGNOSIS — C61 Malignant neoplasm of prostate: Secondary | ICD-10-CM | POA: Diagnosis not present

## 2020-06-02 DIAGNOSIS — N528 Other male erectile dysfunction: Secondary | ICD-10-CM

## 2020-06-02 MED ORDER — TADALAFIL 20 MG PO TABS: ORAL_TABLET | ORAL | 11 refills | Status: DC

## 2020-06-02 NOTE — Progress Notes (Signed)
   06/02/2020 2:40 PM   Leslie Prince Jul 24, 1945 [id number]  Reason for visit: Follow up prostate cancer, OAB, ED  HPI: I saw Leslie Prince back in clinic for follow-up of the above issues.  He is a 75 year old male with morbid obesity BMI 40 who was diagnosed with high risk prostate cancer in December 2019 for an abnormal DRE with a PSA of 5.9.  He ultimately underwent treatment with 2 years of ADT and external beam radiation.  Last dose of ADT was in July 2021, and he completed 2 years total.  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  He has overall been doing well.  PSA remains undetectable, and was most recently checked on 04/14/2020 indicating excellent cancer control.  We discussed the need to continue to follow the PSA on a yearly basis.  Regarding his urinary symptoms, he does have some urgency and frequency, and takes Flomax.  He is unsure if the Flomax helps.  He has cut back on Diet Coke which she does think improves his symptoms.  I think it is reasonable to try a trial off of the Flomax and see if his urinary symptoms change at all, and we could consider oxybutynin in the future to see if this helps with his more OAB picture.  In terms of erections, he has not tried any PDE 5 inhibitors recently, and does not currently get any erections.  He is interested in trying a PDE 5 inhibitor and we discussed the risks and benefits of Viagra or Cialis, and he would like to try Cialis 10 to 20 mg on demand.  Trial of Cialis 10-20 mg on demand for ED Discontinue Flomax and monitor urinary symptoms, consider resuming or starting oxybutynin in the future I offered him follow-up in a month regarding the above changes, but he would like to contact us with any problems, and follow-up in a year with a PSA/IPSS/PVR   Billey Co, MD  [facility] [address], [place], [place] [address] [phone]

## 2020-06-02 NOTE — Progress Notes (Signed)
Radiation Oncology Follow up Note  Name: Leslie Prince   Date:   06/02/2020 MRN:  [id number] DOB: 03-26-1946    This 75 y.o. 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  REFERRING PROVIDER: [name], MD  HPI: Patient is a 75 year old male now out 2 years having completed radiation therapy to his prostate for Gleason 8 adenocarcinoma presenting with a PSA of 5.9.  He is seen today in routine follow-up is doing well specifically denies any increased lower urinary tract symptoms diarrhea or fatigue.  He recently back in August had an endovascular repair stent graft and 5 cm AAA.  His most recent PSA is less than 4.40 COMPLICATIONS OF TREATMENT: none  FOLLOW UP COMPLIANCE: keeps appointments   PHYSICAL EXAM:  BP (!) (P) 154/79 (BP Location: Right Arm, Patient Position: Sitting)   Pulse (P) 61   Temp (!) (P) 97.2 F (36.2 C) (Tympanic)   Resp (P) 20   Wt (P) 269 lb 9.6 oz (122.3 kg)   BMI (P) 39.81 kg/m  Well-developed well-nourished patient in NAD. HEENT reveals PERLA, EOMI, discs not visualized.  Oral cavity is clear. No oral mucosal lesions are identified. Neck is clear without evidence of cervical or supraclavicular adenopathy. Lungs are clear to A&P. Cardiac examination is essentially unremarkable with regular rate and rhythm without murmur rub or thrill. Abdomen is benign with no organomegaly or masses noted. Motor sensory and DTR levels are equal and symmetric in the upper and lower extremities. Cranial nerves II through XII are grossly intact. Proprioception is intact. No peripheral adenopathy or edema is identified. No motor or sensory levels are noted. Crude visual fields are within normal range.  RADIOLOGY RESULTS: No current films to review  PLAN: Present time patient is under excellent biochemical control of his prostate cancer.   I am pleased with his overall progress.  I have asked to see him back in 1 year with a PSA.  Patient knows to call with any concerns.  I would like to take this opportunity to thank you for allowing me to participate in the care of your patient.Noreene Filbert, MD

## 2020-06-10 ENCOUNTER — Other Ambulatory Visit (INDEPENDENT_AMBULATORY_CARE_PROVIDER_SITE_OTHER): Payer: Self-pay | Admitting: Nurse Practitioner

## 2020-06-10 DIAGNOSIS — I714 Abdominal aortic aneurysm, without rupture, unspecified: Secondary | ICD-10-CM

## 2020-06-16 NOTE — Progress Notes (Signed)
MRN : [id number]  Leslie Prince is a 75 y.o. (1945-09-11) male who presents with chief complaint of No chief complaint on file. Marland Kitchen  History of Present Illness:  The patient returns to the office for surveillance of an abdominal aortic aneurysm status post stent graft placement on 11/26/2019.   Patient denies abdominal pain or back pain, no other abdominal complaints. No groin related complaints. No symptoms consistent with distal embolization No changes in claudication distance.   There have been no interval changes in his overall healthcare since his last visit.   Patient denies amaurosis fugax or TIA symptoms. There is no history of claudication or rest pain symptoms of the lower extremities. The patient denies angina or shortness of breath.   Duplex US of the aorta and iliac arteries shows a 4.64 AAA sac with no  endoleak, no significant change in the sac compared to the previous study.  No outpatient medications have been marked as taking for the 06/17/20 encounter (Appointment) with Delana Meyer, Dolores Lory, MD.    Past Medical History:  Diagnosis Date  . Atrial fibrillation ([place])   . Cancer [facility])    prostate cancer  . CHF (congestive heart failure) ([place])   . COPD (chronic obstructive pulmonary disease) ([place])   . Coronary artery disease   . Depression   . Hypertension   . Sleep apnea     Past Surgical History:  Procedure Laterality Date  . CARDIAC SURGERY    . CARDIAC VALVE REPLACEMENT    . CARDIOVERSION  2013  . CATARACT EXTRACTION W/PHACO Right 08/06/2019   Procedure: CATARACT EXTRACTION PHACO AND INTRAOCULAR LENS PLACEMENT (IOC) RIGHT 12.34  01:25.4  14.5%;  Surgeon: Leandrew Koyanagi, MD;  Location: [facility];  Service: Ophthalmology;  Laterality: Right;  . CATARACT EXTRACTION W/PHACO Left 08/27/2019   Procedure: CATARACT EXTRACTION PHACO AND INTRAOCULAR LENS PLACEMENT (IOC) LEFT 7.92 01:16.9 10.3%;  Surgeon: Leandrew Koyanagi, MD;  Location: [facility];  Service: Ophthalmology;  Laterality: Left;  sleep apnea  . COLONOSCOPY WITH PROPOFOL N/A 11/07/2017   Procedure: COLONOSCOPY WITH PROPOFOL;  Surgeon: Manya Silvas, MD;  Location: [facility] ENDOSCOPY;  Service: Endoscopy;  Laterality: N/A;  . CORONARY ANGIOPLASTY WITH STENT PLACEMENT  2011  . CORONARY ARTERY BYPASS GRAFT     during mitral valve repair  . ENDOVASCULAR REPAIR/STENT GRAFT N/A 11/26/2019   Procedure: ENDOVASCULAR REPAIR/STENT GRAFT;  Surgeon: Katha Cabal, MD;  Location: [facility] CV LAB;  Service: Cardiovascular;  Laterality: N/A;  . MITRAL VALVE REPAIR  1997    Social History Social History   Tobacco Use  . Smoking status: Former Smoker    Packs/day: 1.00    Years: 25.00    Pack years: 25.00    Types: Cigarettes    Quit date: 06/21/1995    Years since quitting: 25.0  . Smokeless tobacco: Never Used  Vaping Use  . Vaping Use: Never used  Substance Use Topics  . Alcohol use: Not Currently    Comment: Rare  . Drug use: Never    Family History Family History  Problem Relation Age of Onset  . Hypertension Mother   . Valvular heart disease Father     Allergies  Allergen Reactions  . Lisinopril Other (See Comments)    Severe urination Other reaction(s): Other (See Comments), Other (See Comments) Severe urination      REVIEW OF SYSTEMS (Negative unless checked)  Constitutional: [] Weight loss  [] Fever  [] Chills Cardiac: [] Chest pain   []   Chest pressure   [] Palpitations   [] Shortness of breath when laying flat   [] Shortness of breath with exertion. Vascular:  [] Pain in legs with walking   [] Pain in legs at rest  [] History of DVT   [] Phlebitis   [] Swelling in legs   [] Varicose veins   [] Non-healing ulcers Pulmonary:   [] Uses home oxygen   [] Productive cough   [] Hemoptysis   [] Wheeze  [] COPD   [] Asthma Neurologic:  [] Dizziness   [] Seizures   [] History of stroke   [] History of TIA  [] Aphasia   [] Vissual changes   [] Weakness or numbness in  arm   [] Weakness or numbness in leg Musculoskeletal:   [] Joint swelling   [] Joint pain   [] Low back pain Hematologic:  [] Easy bruising  [] Easy bleeding   [] Hypercoagulable state   [] Anemic Gastrointestinal:  [] Diarrhea   [] Vomiting  [] Gastroesophageal reflux/heartburn   [] Difficulty swallowing. Genitourinary:  [] Chronic kidney disease   [] Difficult urination  [] Frequent urination   [] Blood in urine Skin:  [] Rashes   [] Ulcers  Psychological:  [] History of anxiety   []  History of major depression.  Physical Examination  There were no vitals filed for this visit. There is no height or weight on file to calculate BMI. Gen: WD/WN, NAD Head: [place]/AT, No temporalis wasting.  Ear/Nose/Throat: Hearing grossly intact, nares w/o erythema or drainage Eyes: PER, EOMI, sclera nonicteric.  Neck: Supple, no large masses.   Pulmonary:  Good air movement, no audible wheezing bilaterally, no use of accessory muscles.  Cardiac: RRR, no JVD Vascular:  Vessel Right Left  Radial Palpable Palpable  PT Not Palpable Not Palpable  DP Not Palpable Not Palpable  Gastrointestinal: Non-distended. No guarding/no peritoneal signs.  Musculoskeletal: M/S 5/5 throughout.  No deformity or atrophy.  Neurologic: CN 2-12 intact. Symmetrical.  Speech is fluent. Motor exam as listed above. Psychiatric: Judgment intact, Mood & affect appropriate for pt's clinical situation. Dermatologic: No rashes or ulcers noted.  No changes consistent with cellulitis. Lymph : No lichenification or skin changes of chronic lymphedema.  CBC Lab Results  Component Value Date   WBC 11.8 (H) 11/27/2019   HGB 11.0 (L) 11/27/2019   HCT 32.9 (L) 11/27/2019   MCV 94.3 11/27/2019   PLT 125 (L) 11/27/2019    BMET    Component Value Date/Time   NA 141 11/27/2019 0545   K 4.2 11/27/2019 0545   CL 106 11/27/2019 0545   CO2 27 11/27/2019 0545   GLUCOSE 128 (H) 11/27/2019 0545   BUN 11 11/27/2019 0545   CREATININE 0.88 11/27/2019 0545    CALCIUM 9.1 11/27/2019 0545   GFRNONAA >60 11/27/2019 0545   GFRAA >60 11/27/2019 0545   CrCl cannot be calculated (Patient's most recent lab result is older than the maximum 21 days allowed.).  COAG Lab Results  Component Value Date   INR 2.0 (H) 11/19/2019   INR 1.7 (H) 11/15/2018    Radiology CT ABDOMEN PELVIS WO CONTRAST  Result Date: 05/19/2020 CLINICAL DATA:  75 year old male with history of abdominal aortic aneurysm endovascular stent graft repair done in August 2021. Patient complaining of constipation. History of prostate cancer. EXAM: CT CHEST, ABDOMEN AND PELVIS WITHOUT CONTRAST TECHNIQUE: Multidetector CT imaging of the chest, abdomen and pelvis was performed following the standard protocol without IV contrast. COMPARISON:  CT of the chest abdomen pelvis dated 10/16/2019. FINDINGS: Evaluation of this exam is limited in the absence of intravenous contrast. CT CHEST FINDINGS Cardiovascular: Mild cardiomegaly. No pericardial effusion. Advanced 3 vessel coronary vascular  calcification. Mild atherosclerotic calcification of the thoracic aorta. The aorta is mildly tortuous. No aneurysmal dilatation. The central pulmonary arteries are grossly unremarkable. Mediastinum/Nodes: No hilar or mediastinal adenopathy. The esophagus is grossly unremarkable. No mediastinal fluid collection. Lungs/Pleura: Left lung base linear atelectasis/scarring. No focal consolidation, pleural effusion, or pneumothorax. Small right middle lobe calcified granuloma. The central airways are patent. Musculoskeletal: Osteopenia with degenerative changes of the spine. Median sternotomy wires. No acute osseous pathology. CT ABDOMEN PELVIS FINDINGS No intra-abdominal free air or free fluid. Hepatobiliary: No focal liver abnormality is seen. No gallstones, gallbladder wall thickening, or biliary dilatation. Pancreas: Unremarkable. No pancreatic ductal dilatation or surrounding inflammatory changes. Spleen: Normal in size without  focal abnormality. Adrenals/Urinary Tract: The adrenal glands unremarkable. There is no hydronephrosis or nephrolithiasis on either side. The visualized ureters and urinary bladder appear unremarkable. Stomach/Bowel: There is moderate stool throughout the colon. There is no bowel obstruction or active inflammation. The appendix is normal. Vascular/Lymphatic: Advanced aortoiliac atherosclerotic disease. Status post aorto bi iliac endovascular stent graft repair of a 5 cm distal abdominal aortic aneurysm. Evaluation of the aneurysm and stent graft is limited in the absence of intravenous contrast. No periaortic fluid collection or stranding. The IVC is unremarkable. No portal venous gas. There is no adenopathy. Reproductive: The prostate and seminal vesicles are grossly unremarkable. No pelvic mass. Other: None Musculoskeletal: Osteopenia with degenerative changes. No acute osseous pathology. IMPRESSION: 1. No acute intrathoracic, abdominal, or pelvic pathology. 2. Moderate colonic stool burden. No bowel obstruction. Normal appendix. 3. Status post endovascular stent graft repair of distal abdominal aortic aneurysm. No interval change in the size of the aneurysmal sac. No periaortic inflammation. 4. Aortic Atherosclerosis (ICD10-I70.0). Electronically Signed   By: Anner Crete M.D.   On: 05/19/2020 21:56   CT CHEST WO CONTRAST  Result Date: 05/19/2020 CLINICAL DATA:  75 year old male with history of abdominal aortic aneurysm endovascular stent graft repair done in August 2021. Patient complaining of constipation. History of prostate cancer. EXAM: CT CHEST, ABDOMEN AND PELVIS WITHOUT CONTRAST TECHNIQUE: Multidetector CT imaging of the chest, abdomen and pelvis was performed following the standard protocol without IV contrast. COMPARISON:  CT of the chest abdomen pelvis dated 10/16/2019. FINDINGS: Evaluation of this exam is limited in the absence of intravenous contrast. CT CHEST FINDINGS Cardiovascular: Mild  cardiomegaly. No pericardial effusion. Advanced 3 vessel coronary vascular calcification. Mild atherosclerotic calcification of the thoracic aorta. The aorta is mildly tortuous. No aneurysmal dilatation. The central pulmonary arteries are grossly unremarkable. Mediastinum/Nodes: No hilar or mediastinal adenopathy. The esophagus is grossly unremarkable. No mediastinal fluid collection. Lungs/Pleura: Left lung base linear atelectasis/scarring. No focal consolidation, pleural effusion, or pneumothorax. Small right middle lobe calcified granuloma. The central airways are patent. Musculoskeletal: Osteopenia with degenerative changes of the spine. Median sternotomy wires. No acute osseous pathology. CT ABDOMEN PELVIS FINDINGS No intra-abdominal free air or free fluid. Hepatobiliary: No focal liver abnormality is seen. No gallstones, gallbladder wall thickening, or biliary dilatation. Pancreas: Unremarkable. No pancreatic ductal dilatation or surrounding inflammatory changes. Spleen: Normal in size without focal abnormality. Adrenals/Urinary Tract: The adrenal glands unremarkable. There is no hydronephrosis or nephrolithiasis on either side. The visualized ureters and urinary bladder appear unremarkable. Stomach/Bowel: There is moderate stool throughout the colon. There is no bowel obstruction or active inflammation. The appendix is normal. Vascular/Lymphatic: Advanced aortoiliac atherosclerotic disease. Status post aorto bi iliac endovascular stent graft repair of a 5 cm distal abdominal aortic aneurysm. Evaluation of the aneurysm and stent graft is limited  in the absence of intravenous contrast. No periaortic fluid collection or stranding. The IVC is unremarkable. No portal venous gas. There is no adenopathy. Reproductive: The prostate and seminal vesicles are grossly unremarkable. No pelvic mass. Other: None Musculoskeletal: Osteopenia with degenerative changes. No acute osseous pathology. IMPRESSION: 1. No acute  intrathoracic, abdominal, or pelvic pathology. 2. Moderate colonic stool burden. No bowel obstruction. Normal appendix. 3. Status post endovascular stent graft repair of distal abdominal aortic aneurysm. No interval change in the size of the aneurysmal sac. No periaortic inflammation. 4. Aortic Atherosclerosis (ICD10-I70.0). Electronically Signed   By: Anner Crete M.D.   On: 05/19/2020 21:56     Assessment/Plan 1. AAA (abdominal aortic aneurysm) without rupture [facility]) Patient is status post successful endovascular repair of the AAA.   No further intervention is required at this time.   No endoleak is detected and the aneurysm sac is stable.  The patient will continue antiplatelet therapy as prescribed as well as aggressive management of hyperlipidemia. Exercise is again strongly encouraged.   However, endografts require continued surveillance with ultrasound or CT scan. This is mandatory to detect any changes that allow repressurization of the aneurysm sac.  The patient is informed that this would be asymptomatic.  The patient is reminded that lifelong routine surveillance is a necessity with an endograft. Patient will continue to follow-up at 6 month intervals with ultrasound of the aorta.  - VAS US AORTA/IVC/ILIACS; Future  2. PAD (peripheral artery disease) (HCC)  Recommend:  The patient has evidence of atherosclerosis of the lower extremities with claudication.  The patient does not voice lifestyle limiting changes at this point in time.  Noninvasive studies do not suggest clinically significant change.  No invasive studies, angiography or surgery at this time The patient should continue walking and begin a more formal exercise program.  The patient should continue antiplatelet therapy and aggressive treatment of the lipid abnormalities  No changes in the patient's medications at this time  The patient should continue wearing graduated compression socks 10-15 mmHg strength  to control the mild edema.   - VAS [place] ABI WITH/WO TBI; Future  3. Coronary artery disease of native artery of native heart with stable angina pectoris (HCC) Continue cardiac and antihypertensive medications as already ordered and reviewed, no changes at this time.  Continue statin as ordered and reviewed, no changes at this time  Nitrates PRN for chest pain   4. Permanent atrial fibrillation (HCC) Continue antiarrhythmia medications as already ordered, these medications have been reviewed and there are no changes at this time.  Continue anticoagulation as ordered by Cardiology Service   5. Primary hypertension Continue antihypertensive medications as already ordered, these medications have been reviewed and there are no changes at this time.   6. Mixed hyperlipidemia Continue statin as ordered and reviewed, no changes at this time      [name], MD  06/16/2020 8:36 PM

## 2020-06-17 ENCOUNTER — Other Ambulatory Visit: Payer: Self-pay

## 2020-06-17 ENCOUNTER — Ambulatory Visit (INDEPENDENT_AMBULATORY_CARE_PROVIDER_SITE_OTHER): Payer: Medicare PPO

## 2020-06-17 ENCOUNTER — Ambulatory Visit (INDEPENDENT_AMBULATORY_CARE_PROVIDER_SITE_OTHER): Payer: Medicare PPO | Admitting: Vascular Surgery

## 2020-06-17 VITALS — BP 153/83 | HR 83 | Ht 69.0 in | Wt 274.0 lb

## 2020-06-17 DIAGNOSIS — I714 Abdominal aortic aneurysm, without rupture, unspecified: Secondary | ICD-10-CM

## 2020-06-17 DIAGNOSIS — I739 Peripheral vascular disease, unspecified: Secondary | ICD-10-CM

## 2020-06-17 DIAGNOSIS — I4821 Permanent atrial fibrillation: Secondary | ICD-10-CM

## 2020-06-17 DIAGNOSIS — I25118 Atherosclerotic heart disease of native coronary artery with other forms of angina pectoris: Secondary | ICD-10-CM

## 2020-06-17 DIAGNOSIS — I10 Essential (primary) hypertension: Secondary | ICD-10-CM

## 2020-06-17 DIAGNOSIS — E782 Mixed hyperlipidemia: Secondary | ICD-10-CM

## 2020-06-19 ENCOUNTER — Encounter (INDEPENDENT_AMBULATORY_CARE_PROVIDER_SITE_OTHER): Payer: Self-pay | Admitting: Vascular Surgery

## 2020-06-19 DIAGNOSIS — I739 Peripheral vascular disease, unspecified: Secondary | ICD-10-CM | POA: Insufficient documentation

## 2020-06-28 ENCOUNTER — Encounter: Payer: Self-pay | Admitting: Cardiology

## 2020-06-28 ENCOUNTER — Ambulatory Visit: Payer: Medicare PPO | Admitting: Cardiology

## 2020-06-28 ENCOUNTER — Other Ambulatory Visit: Payer: Self-pay

## 2020-06-28 VITALS — BP 140/66 | HR 76 | Ht 69.0 in | Wt 273.0 lb

## 2020-06-28 DIAGNOSIS — I77810 Thoracic aortic ectasia: Secondary | ICD-10-CM | POA: Diagnosis not present

## 2020-06-28 DIAGNOSIS — I10 Essential (primary) hypertension: Secondary | ICD-10-CM | POA: Diagnosis not present

## 2020-06-28 DIAGNOSIS — I4821 Permanent atrial fibrillation: Secondary | ICD-10-CM | POA: Diagnosis not present

## 2020-06-28 DIAGNOSIS — Z951 Presence of aortocoronary bypass graft: Secondary | ICD-10-CM

## 2020-06-28 NOTE — Patient Instructions (Signed)
Medication Instructions:  Your physician has recommended you make the following change in your medication:  STOP Aspirin.  *If you need a refill on your cardiac medications before your next appointment, please call your pharmacy*  Follow-Up: At [facility], you and your health needs are our priority.  As part of our continuing mission to provide you with exceptional heart care, we have created designated Provider Care Teams.  These Care Teams include your primary Cardiologist (physician) and Advanced Practice Providers (APPs -  Physician Assistants and Nurse Practitioners) who all work together to provide you with the care you need, when you need it.  We recommend signing up for the patient portal called "MyChart".  Sign up information is provided on this After Visit Summary.  MyChart is used to connect with patients for Virtual Visits (Telemedicine).  Patients are able to view lab/test results, encounter notes, upcoming appointments, etc.  Non-urgent messages can be sent to your provider as well.   To learn more about what you can do with MyChart, go to [email or web address].    Your next appointment:   6 month(s)  The format for your next appointment:   In Person  Provider:   You may see Kate Sable, MD or one of the following Advanced Practice Providers on your designated Care Team:    Murray Hodgkins, NP  Christell Faith, PA-C  Marrianne Mood, PA-C  Cadence Coyle, [place]  Laurann Montana, NP

## 2020-06-28 NOTE — Progress Notes (Signed)
Cardiology Office Note:    Date:  06/28/2020   ID:  Leslie Prince, DOB Apr 01, 1946, MRN [id number]  PCP:  Leslie Pitch, MD  [facility] Cardiologist:  Leslie Sable, MD  [place] Electrophysiologist:  None   Referring MD: Leslie Pitch, MD   Chief Complaint  Patient presents with  . OTher    6 month follow up - Patient c.o swelling in left ankle. Meds reviewed verbally with patient.     History of Present Illness:    Leslie Prince is a 75 y.o. male with a hx of chronic atrial fibrillation on Pradaxa, CAD PCI w/ DES to LCx 2011, CABG x 3 in 1997, mitral valve repair 1997, AAA s/p endovascular stent repair 11/2019, hypertension, former smoker, COPD who presents for follow-up.    Patient being seen due to CAD, atrial fibrillation and hypertension.  Currently denies chest pain or shortness of breath.  States having easy arm bruises.  Her recent chest CT for lung cancer screening due to his prior smoking history.  Denies palpitations, walks with a walker.  Had one fall due to tripping on something.  Otherwise doing okay.  His blood pressures at home usually in the 120s to 130s.  Prior notes Patient had a recent abdominal CT showing abdominal aortic aneurysm up to 5 cm.  Endovascular repair by vascular surgery was performed on 11/27/2019.  Patient tolerated procedure you without any adverse effects.   Echocardiogram 11/2019 showed low normal ejection fraction, EF 50 to 55%.  Mild ascending aorta dilatation, 42 mm.   Past Medical History:  Diagnosis Date  . Atrial fibrillation ([place])   . Cancer [facility])    prostate cancer  . CHF (congestive heart failure) ([place])   . COPD (chronic obstructive pulmonary disease) ([place])   . Coronary artery disease   . Depression   . Hypertension   . Sleep apnea     Past Surgical History:  Procedure Laterality Date  . CARDIAC SURGERY    . CARDIAC VALVE REPLACEMENT    . CARDIOVERSION  2013  . CATARACT EXTRACTION W/PHACO Right 08/06/2019    Procedure: CATARACT EXTRACTION PHACO AND INTRAOCULAR LENS PLACEMENT (IOC) RIGHT 12.34  01:25.4  14.5%;  Surgeon: Leandrew Koyanagi, MD;  Location: [place];  Service: Ophthalmology;  Laterality: Right;  . CATARACT EXTRACTION W/PHACO Left 08/27/2019   Procedure: CATARACT EXTRACTION PHACO AND INTRAOCULAR LENS PLACEMENT (IOC) LEFT 7.92 01:16.9 10.3%;  Surgeon: Leandrew Koyanagi, MD;  Location: [place];  Service: Ophthalmology;  Laterality: Left;  sleep apnea  . COLONOSCOPY WITH PROPOFOL N/A 11/07/2017   Procedure: COLONOSCOPY WITH PROPOFOL;  Surgeon: Manya Silvas, MD;  Location: [facility] ENDOSCOPY;  Service: Endoscopy;  Laterality: N/A;  . CORONARY ANGIOPLASTY WITH STENT PLACEMENT  2011  . CORONARY ARTERY BYPASS GRAFT     during mitral valve repair  . ENDOVASCULAR REPAIR/STENT GRAFT N/A 11/26/2019   Procedure: ENDOVASCULAR REPAIR/STENT GRAFT;  Surgeon: Katha Cabal, MD;  Location: [place] CV LAB;  Service: Cardiovascular;  Laterality: N/A;  . MITRAL VALVE REPAIR  1997    Current Medications: Current Meds  Medication Sig  . albuterol (PROVENTIL) (2.5 MG/3ML) 0.083% nebulizer solution Take 2.5 mg by nebulization every 6 (six) hours as needed for wheezing.  Marland Kitchen albuterol (VENTOLIN HFA) 108 (90 Base) MCG/ACT inhaler Inhale 2 puffs into the lungs every 6 (six) hours as needed for wheezing.   Marland Kitchen allopurinol (ZYLOPRIM) 100 MG tablet Take 100 mg by mouth daily.  Marland Kitchen aspirin EC 81  MG tablet Take 81 mg by mouth daily.   Marland Kitchen atorvastatin (LIPITOR) 20 MG tablet Take 20 mg by mouth at bedtime.   Marland Kitchen buPROPion (WELLBUTRIN XL) 150 MG 24 hr tablet Take 150 mg by mouth daily.  . Calcium Carb-Cholecalciferol (CALCIUM 600 + D PO) Take by mouth 2 (two) times daily.  . colchicine 0.6 MG tablet Take 0.6 mg by mouth as directed. Take 2 tablets at onset of gout flare, then 1 tablet one hour later  . dabigatran (PRADAXA) 150 MG CAPS capsule Take 150 mg by mouth 2 (two) times daily.   .  enalapril (VASOTEC) 20 MG tablet Take 1 tablet (20 mg total) by mouth 2 (two) times daily.  . fluticasone (FLONASE) 50 MCG/ACT nasal spray Place into both nostrils daily.  . Fluticasone-Salmeterol (ADVAIR) 250-50 MCG/DOSE AEPB Inhale 1 puff into the lungs 2 (two) times daily.   . furosemide (LASIX) 40 MG tablet Take 40 mg by mouth daily.   Marland Kitchen KLOR-CON M10 10 MEQ tablet Take 20 mEq by mouth daily.   . metoprolol succinate (TOPROL-XL) 100 MG 24 hr tablet Take 100 mg by mouth daily.  . Misc Natural Products (TART CHERRY ADVANCED PO) Take by mouth daily.   . montelukast (SINGULAIR) 10 MG tablet Take 10 mg by mouth at bedtime.   . Multiple Vitamins tablet Take 1 tablet by mouth daily.   . mupirocin ointment (BACTROBAN) 2 % Apply topically 3 (three) times daily.  . tadalafil (CIALIS) 20 MG tablet Take 10-20mg  by mouth as need  . tiotropium (SPIRIVA) 18 MCG inhalation capsule Place 18 mcg into inhaler and inhale daily.     Allergies:   Lisinopril   Social History   Socioeconomic History  . Marital status: Married    Spouse name: Not on file  . Number of children: Not on file  . Years of education: Not on file  . Highest education level: Not on file  Occupational History  . Not on file  Tobacco Use  . Smoking status: Former Smoker    Packs/day: 1.00    Years: 25.00    Pack years: 25.00    Types: Cigarettes    Quit date: 06/21/1995    Years since quitting: 25.0  . Smokeless tobacco: Never Used  Vaping Use  . Vaping Use: Never used  Substance and Sexual Activity  . Alcohol use: Not Currently    Comment: Rare  . Drug use: Never  . Sexual activity: Yes    Birth control/protection: None  Other Topics Concern  . Not on file  Social History Narrative  . Not on file   Social Determinants of Health   Financial Resource Strain: Not on file  Food Insecurity: Not on file  Transportation Needs: Not on file  Physical Activity: Not on file  Stress: Not on file  Social Connections: Not  on file     Family History: The patient's family history includes Hypertension in his mother; Valvular heart disease in his father.  ROS:   Please see the history of present illness.     All other systems reviewed and are negative.  EKGs/Labs/Other Studies Reviewed:    The following studies were reviewed today:   EKG:  EKG is ordered today.  EKG shows atrial fibrillation, heart rate 76.  Recent Labs: 11/27/2019: BUN 11; Creatinine, Ser 0.88; Hemoglobin 11.0; Platelets 125; Potassium 4.2; Sodium 141  Recent Lipid Panel No results found for: CHOL, TRIG, HDL, CHOLHDL, VLDL, LDLCALC, LDLDIRECT  Physical Exam:  VS:  BP 140/66 (BP Location: Left Arm, Patient Position: Sitting, Cuff Size: Normal)   Pulse 76   Ht 5\' 9"  (1.753 m)   Wt 273 lb (123.8 kg)   SpO2 95%   BMI 40.32 kg/m     Wt Readings from Last 3 Encounters:  06/28/20 273 lb (123.8 kg)  06/17/20 274 lb (124.3 kg)  06/02/20 (P) 269 lb 9.6 oz (122.3 kg)     GEN:  Well nourished, well developed in no acute distress HEENT: Normal NECK: No JVD; No carotid bruits LYMPHATICS: No lymphadenopathy CARDIAC: Irregular irregular, no murmurs, rubs, gallops RESPIRATORY:  Clear to auscultation without rales, wheezing or rhonchi  ABDOMEN: Soft, non-tender, non-distended MUSCULOSKELETAL:  No edema; No deformity  SKIN: Warm and dry NEUROLOGIC:  Alert and oriented x 3 PSYCHIATRIC:  Normal affect   ASSESSMENT:    1. Hx of CABG   2. Permanent atrial fibrillation ([place])   3. Essential hypertension   4. Ascending aorta dilatation (HCC)    PLAN:    In order of problems listed above:  1. Patient with history of CAD/CABG, PCI to the left circumflex.  Denies any anginal symptoms.  Echo on 11/2019 preserved, EF 50 to 55%. Cont statin, beta-blocker, Pradaxa.  Stop aspirin due to easy bruisability.  Patient on Pradaxa. 2. History of permanent atrial fibrillation, heart rate controlled.  Toprol-XL and Pradaxa.  3. History of  hypertension, BP usually controlled at home.  Continue Toprol, enalapril.  May have a component of whitecoat syndrome. 4. 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  Noncontrast chest CT to/12/2020 reviewed by myself and ascending aorta measured showing 4.2 cm at the level of the main pulmonary artery.  Mild ascending aneurysm is stable.  He plans to obtain serial CT scans for lung cancer screening.  Will evaluate/keep monitoring aortic size with these.  Follow-up in 6 months   Total encounter 40 minutes  Greater than 50% was spent in counseling and coordination of care with the patient  Medication Adjustments/Labs and Tests Ordered: Current medicines are reviewed at length with the patient today.  Concerns regarding medicines are outlined above.  Orders Placed This Encounter  Procedures  . EKG 12-Lead   No orders of the defined types were placed in this encounter.   Patient Instructions  Medication Instructions:  Your physician has recommended you make the following change in your medication:  STOP Aspirin.  *If you need a refill on your cardiac medications before your next appointment, please call your pharmacy*  Follow-Up: At [facility], you and your health needs are our priority.  As part of our continuing mission to provide you with exceptional heart care, we have created designated Provider Care Teams.  These Care Teams include your primary Cardiologist (physician) and Advanced Practice Providers (APPs -  Physician Assistants and Nurse Practitioners) who all work together to provide you with the care you need, when you need it.  We recommend signing up for the patient portal called "MyChart".  Sign up information is provided on this After Visit Summary.  MyChart is used to connect with patients for Virtual Visits (Telemedicine).  Patients are able to view lab/test results, encounter notes, upcoming appointments, etc.  Non-urgent messages can be sent to your provider as well.   To  learn more about what you can do with MyChart, go to [email or web address].    Your next appointment:   6 month(s)  The format for your next appointment:   In Person  Provider:   You  may see Leslie Sable, MD or one of the following Advanced Practice Providers on your designated Care Team:    Murray Hodgkins, NP  Christell Faith, PA-C  Marrianne Mood, PA-C  Cadence Ong, [place]  Laurann Montana, NP      Signed, Leslie Sable, MD  06/28/2020 12:19 PM    [place]

## 2020-08-23 ENCOUNTER — Other Ambulatory Visit: Payer: Self-pay

## 2020-08-23 MED ORDER — ENALAPRIL MALEATE 20 MG PO TABS: 20.0000 mg | ORAL_TABLET | Freq: Two times a day (BID) | ORAL | 0 refills | Status: DC

## 2020-08-24 ENCOUNTER — Other Ambulatory Visit: Payer: Self-pay | Admitting: Radiation Oncology

## 2020-09-13 ENCOUNTER — Other Ambulatory Visit
Admission: RE | Admit: 2020-09-13 | Discharge: 2020-09-13 | Disposition: A | Discharge status: Home / Self Care | Payer: Medicare PPO | Attending: Cardiology | Admitting: Cardiology

## 2020-09-13 ENCOUNTER — Other Ambulatory Visit: Payer: Self-pay

## 2020-09-13 DIAGNOSIS — Z79899 Other long term (current) drug therapy: Secondary | ICD-10-CM | POA: Insufficient documentation

## 2020-09-13 LAB — BASIC METABOLIC PANEL
Anion gap: 8 (ref 5–15)
BUN: 16 mg/dL (ref 8–23)
CO2: 27 mmol/L (ref 22–32)
Calcium: 9.4 mg/dL (ref 8.9–10.3)
Chloride: 106 mmol/L (ref 98–111)
Creatinine, Ser: 1 mg/dL (ref 0.61–1.24)
GFR, Estimated: >60 mL/min (ref >60)
Glucose, Bld: 103 mg/dL — ABNORMAL HIGH (ref 70–99)
Potassium: 3.9 mmol/L (ref 3.5–5.1)
Sodium: 141 mmol/L (ref 135–145)

## 2020-09-13 MED ORDER — KLOR-CON M10 10 MEQ PO TBCR: 20.0000 meq | EXTENDED_RELEASE_TABLET | Freq: Every day | ORAL | 4 refills | Status: DC

## 2020-10-04 ENCOUNTER — Other Ambulatory Visit: Payer: Self-pay

## 2020-10-04 MED ORDER — DABIGATRAN ETEXILATE MESYLATE 150 MG PO CAPS
150.0000 mg | ORAL_CAPSULE | Freq: Two times a day (BID) | ORAL | 1 refills | Status: DC
Start: 2020-10-04 — End: 2021-03-28

## 2020-10-04 NOTE — Telephone Encounter (Signed)
64M, 123.8KG, SCR 1.00 09/13/20, LOVW/AGBORETANG 06/28/20, CCR 108.3

## 2020-11-29 ENCOUNTER — Other Ambulatory Visit: Payer: Self-pay | Admitting: *Deleted

## 2020-11-29 MED ORDER — ENALAPRIL MALEATE 20 MG PO TABS: 20.0000 mg | ORAL_TABLET | Freq: Two times a day (BID) | ORAL | 0 refills | Status: DC

## 2020-12-30 ENCOUNTER — Ambulatory Visit: Payer: Medicare PPO | Admitting: Cardiology

## 2021-01-17 ENCOUNTER — Other Ambulatory Visit: Payer: Self-pay

## 2021-01-17 ENCOUNTER — Ambulatory Visit: Payer: Medicare PPO | Admitting: Cardiology

## 2021-01-17 ENCOUNTER — Encounter: Payer: Self-pay | Admitting: Cardiology

## 2021-01-17 VITALS — BP 130/72 | HR 68 | Ht 69.0 in | Wt 278.0 lb

## 2021-01-17 DIAGNOSIS — Z951 Presence of aortocoronary bypass graft: Secondary | ICD-10-CM

## 2021-01-17 DIAGNOSIS — I77810 Thoracic aortic ectasia: Secondary | ICD-10-CM | POA: Diagnosis not present

## 2021-01-17 DIAGNOSIS — I10 Essential (primary) hypertension: Secondary | ICD-10-CM

## 2021-01-17 DIAGNOSIS — I4821 Permanent atrial fibrillation: Secondary | ICD-10-CM

## 2021-01-17 NOTE — Progress Notes (Signed)
Cardiology Office Note:    Date:  01/17/2021   ID:  Leslie Prince, DOB 1945/11/09, MRN [id number]  PCP:  Leslie Pitch, MD  [facility] Cardiologist:  Leslie Sable, MD  [place] Electrophysiologist:  None   Referring MD: Leslie Pitch, MD   Chief Complaint  Patient presents with   Other    6 month follow up -- Patient c.o SOB and swelling in ankles. Meds reviewed verbally with patient.     History of Present Illness:    Leslie Prince is a 75 y.o. male with a hx of chronic atrial fibrillation on Pradaxa, CAD PCI w/ DES to LCx 2011, CABG x 3 in 1997, mitral valve repair 1997, AAA s/p endovascular stent repair 11/2019, hypertension, former smoker, COPD who presents for follow-up.    Patient being seen due to CAD, atrial fibrillation and hypertension.  States having some lower extremity edema, gets short of breath with exertion.  Shortness of breath is chronic due to COPD.  He usually gets worse without infection or edema.  Takes Lasix as prescribed.  BP well controlled on current medications.    Prior notes Patient had a recent abdominal CT showing abdominal aortic aneurysm up to 5 cm.  Endovascular repair by vascular surgery was performed on 11/27/2019.   Echocardiogram 11/2019 showed low normal ejection fraction, EF 50 to 55%.  Mild ascending aorta dilatation, 42 mm.   Past Medical History:  Diagnosis Date   Atrial fibrillation ([place])    Cancer [facility])    prostate cancer   CHF (congestive heart failure) (HCC)    COPD (chronic obstructive pulmonary disease) ([place])    Coronary artery disease    Depression    Hypertension    Sleep apnea     Past Surgical History:  Procedure Laterality Date   CARDIAC SURGERY     CARDIAC VALVE REPLACEMENT     CARDIOVERSION  2013   CATARACT EXTRACTION W/PHACO Right 08/06/2019   Procedure: CATARACT EXTRACTION PHACO AND INTRAOCULAR LENS PLACEMENT (IOC) RIGHT 12.34  01:25.4  14.5%;  Surgeon: Leandrew Koyanagi, MD;  Location:  [place];  Service: Ophthalmology;  Laterality: Right;   CATARACT EXTRACTION W/PHACO Left 08/27/2019   Procedure: CATARACT EXTRACTION PHACO AND INTRAOCULAR LENS PLACEMENT (IOC) LEFT 7.92 01:16.9 10.3%;  Surgeon: Leandrew Koyanagi, MD;  Location: [place];  Service: Ophthalmology;  Laterality: Left;  sleep apnea   COLONOSCOPY WITH PROPOFOL N/A 11/07/2017   Procedure: COLONOSCOPY WITH PROPOFOL;  Surgeon: Manya Silvas, MD;  Location: [facility] ENDOSCOPY;  Service: Endoscopy;  Laterality: N/A;   CORONARY ANGIOPLASTY WITH STENT PLACEMENT  2011   CORONARY ARTERY BYPASS GRAFT     during mitral valve repair   ENDOVASCULAR REPAIR/STENT GRAFT N/A 11/26/2019   Procedure: ENDOVASCULAR REPAIR/STENT GRAFT;  Surgeon: Katha Cabal, MD;  Location: [place] CV LAB;  Service: Cardiovascular;  Laterality: N/A;   MITRAL VALVE REPAIR  1997    Current Medications: Current Meds  Medication Sig   albuterol (PROVENTIL) (2.5 MG/3ML) 0.083% nebulizer solution Take 2.5 mg by nebulization every 6 (six) hours as needed for wheezing.   albuterol (VENTOLIN HFA) 108 (90 Base) MCG/ACT inhaler Inhale 2 puffs into the lungs every 6 (six) hours as needed for wheezing.    allopurinol (ZYLOPRIM) 100 MG tablet Take 100 mg by mouth daily.   atorvastatin (LIPITOR) 20 MG tablet Take 20 mg by mouth at bedtime.    Calcium Carb-Cholecalciferol (CALCIUM 600 + D PO) Take by mouth 2 (two) times  daily.   colchicine 0.6 MG tablet Take 0.6 mg by mouth as directed. Take 2 tablets at onset of gout flare, then 1 tablet one hour later   dabigatran (PRADAXA) 150 MG CAPS capsule Take 1 capsule (150 mg total) by mouth 2 (two) times daily.   enalapril (VASOTEC) 20 MG tablet Take 1 tablet (20 mg total) by mouth 2 (two) times daily.   fluticasone (FLONASE) 50 MCG/ACT nasal spray Place into both nostrils daily.   Fluticasone-Salmeterol (ADVAIR) 250-50 MCG/DOSE AEPB Inhale 1 puff into the lungs 2 (two) times daily.     furosemide (LASIX) 40 MG tablet Take 40 mg by mouth daily.    KLOR-CON M10 10 MEQ tablet Take 2 tablets (20 mEq total) by mouth daily.   metoprolol succinate (TOPROL-XL) 100 MG 24 hr tablet Take 100 mg by mouth daily.   Misc Natural Products (TART CHERRY ADVANCED PO) Take by mouth daily.    montelukast (SINGULAIR) 10 MG tablet Take 10 mg by mouth at bedtime.    Multiple Vitamins tablet Take 1 tablet by mouth daily.    mupirocin ointment (BACTROBAN) 2 % Apply topically 3 (three) times daily.   tadalafil (CIALIS) 20 MG tablet Take 10-20mg  by mouth as need   tamsulosin (FLOMAX) 0.4 MG CAPS capsule TAKE 1 CAPSULE (0.4 MG TOTAL) BY MOUTH DAILY AFTER SUPPER.   tiotropium (SPIRIVA) 18 MCG inhalation capsule Place 18 mcg into inhaler and inhale daily.     Allergies:   Lisinopril   Social History   Socioeconomic History   Marital status: Married    Spouse name: Not on file   Number of children: Not on file   Years of education: Not on file   Highest education level: Not on file  Occupational History   Not on file  Tobacco Use   Smoking status: Former    Packs/day: 1.00    Years: 25.00    Pack years: 25.00    Types: Cigarettes    Quit date: 06/21/1995    Years since quitting: 25.5   Smokeless tobacco: Never  Vaping Use   Vaping Use: Never used  Substance and Sexual Activity   Alcohol use: Not Currently    Comment: Rare   Drug use: Never   Sexual activity: Yes    Birth control/protection: None  Other Topics Concern   Not on file  Social History Narrative   Not on file   Social Determinants of Health   Financial Resource Strain: Not on file  Food Insecurity: Not on file  Transportation Needs: Not on file  Physical Activity: Not on file  Stress: Not on file  Social Connections: Not on file     Family History: The patient's family history includes Hypertension in his mother; Valvular heart disease in his father.  ROS:   Please see the history of present illness.     All  other systems reviewed and are negative.  EKGs/Labs/Other Studies Reviewed:    The following studies were reviewed today:   EKG:  EKG is ordered today.  EKG shows atrial fibrillation, heart rate 76.  Recent Labs: 09/13/2020: BUN 16; Creatinine, Ser 1.00; Potassium 3.9; Sodium 141  Recent Lipid Panel No results found for: CHOL, TRIG, HDL, CHOLHDL, VLDL, LDLCALC, LDLDIRECT  Physical Exam:    VS:  BP 130/72 (BP Location: Left Arm, Patient Position: Sitting, Cuff Size: Normal)   Pulse 68   Ht 5\' 9"  (1.753 m)   Wt 278 lb (126.1 kg)   SpO2 95%  BMI 41.05 kg/m     Wt Readings from Last 3 Encounters:  01/17/21 278 lb (126.1 kg)  06/28/20 273 lb (123.8 kg)  06/17/20 274 lb (124.3 kg)     GEN:  Well nourished, well developed in no acute distress HEENT: Normal NECK: No JVD; No carotid bruits LYMPHATICS: No lymphadenopathy CARDIAC: Irregular irregular, no murmurs, rubs, gallops RESPIRATORY:  Clear to auscultation without rales, wheezing or rhonchi  ABDOMEN: Soft, non-tender, non-distended MUSCULOSKELETAL:  No edema; No deformity  SKIN: Warm and dry NEUROLOGIC:  Alert and oriented x 3 PSYCHIATRIC:  Normal affect   ASSESSMENT:    1. Hx of CABG   2. Permanent atrial fibrillation ([place])   3. Primary hypertension   4. Ascending aorta dilatation (HCC)     PLAN:    In order of problems listed above:  Patient with history of CAD/CABG, PCI to the left circumflex.  Denies chest pain.  Echo on 11/2019 preserved, EF 50 to 55%. Cont statin, beta-blocker, Pradaxa.   History of permanent atrial fibrillation, heart rate controlled.  Continue Toprol-XL and Pradaxa.  hypertension, BP controlled   Continue Toprol, enalapril.   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  He plans to obtain serial CT scans for lung cancer screening.  Will evaluate/keep monitoring aortic size with these.  Follow-up in 6-12 months   Total encounter 35 minutes  Greater than 50% was spent in counseling and  coordination of care with the patient  Medication Adjustments/Labs and Tests Ordered: Current medicines are reviewed at length with the patient today.  Concerns regarding medicines are outlined above.  Orders Placed This Encounter  Procedures   EKG 12-Lead    No orders of the defined types were placed in this encounter.   Patient Instructions  Medication Instructions:   Your physician recommends that you continue on your current medications as directed. Please refer to the Current Medication list given to you today.  *If you need a refill on your cardiac medications before your next appointment, please call your pharmacy*   Lab Work: None ordered If you have labs (blood work) drawn today and your tests are completely normal, you will receive your results only by: [place] (if you have MyChart) OR A paper copy in the mail If you have any lab test that is abnormal or we need to change your treatment, we will call you to review the results.   Testing/Procedures: None ordered   Follow-Up: At [facility], you and your health needs are our priority.  As part of our continuing mission to provide you with exceptional heart care, we have created designated Provider Care Teams.  These Care Teams include your primary Cardiologist (physician) and Advanced Practice Providers (APPs -  Physician Assistants and Nurse Practitioners) who all work together to provide you with the care you need, when you need it.  We recommend signing up for the patient portal called "MyChart".  Sign up information is provided on this After Visit Summary.  MyChart is used to connect with patients for Virtual Visits (Telemedicine).  Patients are able to view lab/test results, encounter notes, upcoming appointments, etc.  Non-urgent messages can be sent to your provider as well.   To learn more about what you can do with MyChart, go to [email or web address].    Your next appointment:   6-12  month(s)  The format for your next appointment:   In Person  Provider:   Kate Sable, MD   Other Instructions    Signed, Leslie Sable,  MD  01/17/2021 12:51 PM    [facility]

## 2021-01-17 NOTE — Patient Instructions (Signed)
Medication Instructions:   Your physician recommends that you continue on your current medications as directed. Please refer to the Current Medication list given to you today.  *If you need a refill on your cardiac medications before your next appointment, please call your pharmacy*   Lab Work: None ordered If you have labs (blood work) drawn today and your tests are completely normal, you will receive your results only by: [place] (if you have MyChart) OR A paper copy in the mail If you have any lab test that is abnormal or we need to change your treatment, we will call you to review the results.   Testing/Procedures: None ordered   Follow-Up: At [facility], you and your health needs are our priority.  As part of our continuing mission to provide you with exceptional heart care, we have created designated Provider Care Teams.  These Care Teams include your primary Cardiologist (physician) and Advanced Practice Providers (APPs -  Physician Assistants and Nurse Practitioners) who all work together to provide you with the care you need, when you need it.  We recommend signing up for the patient portal called "MyChart".  Sign up information is provided on this After Visit Summary.  MyChart is used to connect with patients for Virtual Visits (Telemedicine).  Patients are able to view lab/test results, encounter notes, upcoming appointments, etc.  Non-urgent messages can be sent to your provider as well.   To learn more about what you can do with MyChart, go to [email or web address].    Your next appointment:   6-12 month(s)  The format for your next appointment:   In Person  Provider:   Kate Sable, MD   Other Instructions

## 2021-02-18 ENCOUNTER — Other Ambulatory Visit: Payer: Self-pay

## 2021-02-18 MED ORDER — KLOR-CON M10 10 MEQ PO TBCR: 20.0000 meq | EXTENDED_RELEASE_TABLET | Freq: Every day | ORAL | 9 refills | Status: DC

## 2021-02-18 MED ORDER — ENALAPRIL MALEATE 20 MG PO TABS: 20.0000 mg | ORAL_TABLET | Freq: Two times a day (BID) | ORAL | 2 refills | Status: DC

## 2021-03-28 ENCOUNTER — Other Ambulatory Visit: Payer: Self-pay | Admitting: *Deleted

## 2021-03-28 MED ORDER — DABIGATRAN ETEXILATE MESYLATE 150 MG PO CAPS: 150.0000 mg | ORAL_CAPSULE | Freq: Two times a day (BID) | ORAL | 1 refills | Status: DC

## 2021-05-26 ENCOUNTER — Other Ambulatory Visit: Payer: Self-pay

## 2021-05-26 ENCOUNTER — Inpatient Hospital Stay: Payer: Medicare PPO | Attending: Radiation Oncology

## 2021-05-26 DIAGNOSIS — C61 Malignant neoplasm of prostate: Secondary | ICD-10-CM

## 2021-05-26 LAB — PSA: Prostatic Specific Antigen: <0.01 ng/mL (ref 0.00–4.00)

## 2021-05-27 ENCOUNTER — Other Ambulatory Visit: Payer: Self-pay

## 2021-06-02 ENCOUNTER — Other Ambulatory Visit: Payer: Self-pay

## 2021-06-02 ENCOUNTER — Encounter: Payer: Self-pay | Admitting: Radiation Oncology

## 2021-06-02 ENCOUNTER — Ambulatory Visit: Payer: Medicare PPO | Admitting: Urology

## 2021-06-02 ENCOUNTER — Other Ambulatory Visit: Payer: Self-pay | Admitting: *Deleted

## 2021-06-02 ENCOUNTER — Ambulatory Visit
Admission: RE | Admit: 2021-06-02 | Discharge: 2021-06-02 | Disposition: A | Discharge status: Home / Self Care | Payer: Medicare PPO | Source: Ambulatory Visit | Attending: Radiation Oncology | Admitting: Radiation Oncology

## 2021-06-02 ENCOUNTER — Encounter: Payer: Self-pay | Admitting: Urology

## 2021-06-02 ENCOUNTER — Encounter: Payer: Self-pay | Admitting: Cardiology

## 2021-06-02 VITALS — BP 146/70 | HR 81 | Ht 69.0 in | Wt 278.0 lb

## 2021-06-02 DIAGNOSIS — C61 Malignant neoplasm of prostate: Secondary | ICD-10-CM

## 2021-06-02 DIAGNOSIS — N3281 Overactive bladder: Secondary | ICD-10-CM | POA: Diagnosis not present

## 2021-06-02 DIAGNOSIS — Z923 Personal history of irradiation: Secondary | ICD-10-CM | POA: Insufficient documentation

## 2021-06-02 DIAGNOSIS — N529 Male erectile dysfunction, unspecified: Secondary | ICD-10-CM | POA: Insufficient documentation

## 2021-06-02 DIAGNOSIS — N521 Erectile dysfunction due to diseases classified elsewhere: Secondary | ICD-10-CM

## 2021-06-02 LAB — BLADDER SCAN AMB NON-IMAGING: Scan Result: 10

## 2021-06-02 MED ORDER — SILDENAFIL CITRATE 100 MG PO TABS: 100.0000 mg | ORAL_TABLET | Freq: Every day | ORAL | 11 refills | Status: DC | PRN

## 2021-06-02 NOTE — Progress Notes (Signed)
Radiation Oncology Follow up Note  Name: Leslie Prince   Date:   06/02/2021 MRN:  [id number] DOB: 01/24/1946    This 76 y.o. 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  REFERRING PROVIDER: [name], MD  HPI: Patient is a 76 year old male now out over 3 years having completed IMRT radiation therapy for stage IIb Gleason 8 adenocarcinoma seen today in routine follow-up he is doing well specifically denies any increased lower urinary tract symptoms diarrhea or fatigue.Marland Kitchen  He had a CT scan back in February which I have reviewed showing no evidence of metastatic disease or acute intrathoracic abdominal or pelvic pathology.  His PSA remains undetectable at less than 0.01 patient does have erectile dysfunction is been using trials of Cialis.  He has discontinued his Flomax  COMPLICATIONS OF TREATMENT: none  FOLLOW UP COMPLIANCE: keeps appointments   PHYSICAL EXAM:  BP (!) (P) 143/76 (BP Location: Left Arm, Patient Position: Sitting)    Pulse (P) 68    Temp (P) 97.8 F (36.6 C) (Tympanic)    Resp (!) (P) 22    Wt (P) 285 lb 9.6 oz (129.5 kg)    BMI (P) 42.18 kg/m  Well-developed well-nourished patient in NAD. HEENT reveals PERLA, EOMI, discs not visualized.  Oral cavity is clear. No oral mucosal lesions are identified. Neck is clear without evidence of cervical or supraclavicular adenopathy. Lungs are clear to A&P. Cardiac examination is essentially unremarkable with regular rate and rhythm without murmur rub or thrill. Abdomen is benign with no organomegaly or masses noted. Motor sensory and DTR levels are equal and symmetric in the upper and lower extremities. Cranial nerves II through XII are grossly intact. Proprioception is intact. No peripheral adenopathy or edema is identified. No motor or sensory levels are noted. Crude visual fields are within  normal range.  RADIOLOGY RESULTS: CT scan reviewed compatible with above-stated findings  PLAN: Present time patient continues to do well under excellent biochemical control of his prostate cancer.  I am pleased with his overall progress.  I have asked to see him back in 1 year for follow-up with repeat PSA.  He continues close follow-up care with urology.  Patient is to call with any concerns.  I would like to take this opportunity to thank you for allowing me to participate in the care of your patient.Noreene Filbert, MD

## 2021-06-03 NOTE — Progress Notes (Signed)
° °  06/02/2021 10:06 AM   Leslie Prince [birth year] [id number]  Reason for visit: Follow up prostate cancer, OAB, ED  HPI: I saw [name] back in clinic for follow-up of the above issues.  He is a 76 year old male with morbid obesity BMI 41 who was diagnosed with high risk prostate cancer in December 2019 for an abnormal DRE with a PSA of 5.9.  He ultimately underwent treatment with 2 years of ADT and external beam radiation.  Last dose of ADT was in July 2021, and he completed 2 years total.  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  He has overall been doing well.  PSA remains undetectable, and was most recently checked on 05/26/2021 indicating excellent cancer control.  We discussed the need to continue to follow the PSA on a yearly basis.  Regarding his urinary symptoms, he does have some urgency and frequency, and takes Flomax.  He is unsure if the Flomax helps.  He also drinks a fair amount of diet soda that likely contributes to his urinary symptoms.  We have discussed previously considering a trial off the Flomax to see if his urinary symptoms change, and he could potentially stop this medication.  In terms of erections, he most recently tried Cialis 20 mg on demand without significant improvement in the erections.  He is interested in a trial of sildenafil 100 mg, as he feels like this has worked well for him previously.  Risk and benefits discussed.  Trial of sildenafil 100 mg on demand for ED Okay to stop Flomax and monitor urinary symptoms, can resume if needed  RTC 1 year [place], MD  [facility] [address], [place], [place] [address] [phone]

## 2021-06-16 ENCOUNTER — Ambulatory Visit (INDEPENDENT_AMBULATORY_CARE_PROVIDER_SITE_OTHER): Payer: Medicare PPO | Admitting: Vascular Surgery

## 2021-06-16 ENCOUNTER — Other Ambulatory Visit: Payer: Self-pay

## 2021-06-16 ENCOUNTER — Ambulatory Visit (INDEPENDENT_AMBULATORY_CARE_PROVIDER_SITE_OTHER): Payer: Medicare PPO

## 2021-06-16 ENCOUNTER — Encounter (INDEPENDENT_AMBULATORY_CARE_PROVIDER_SITE_OTHER): Payer: Self-pay | Admitting: Vascular Surgery

## 2021-06-16 VITALS — BP 118/74 | HR 76 | Resp 17 | Ht 69.0 in | Wt 279.0 lb

## 2021-06-16 DIAGNOSIS — I714 Abdominal aortic aneurysm, without rupture, unspecified: Secondary | ICD-10-CM

## 2021-06-16 DIAGNOSIS — I739 Peripheral vascular disease, unspecified: Secondary | ICD-10-CM | POA: Diagnosis not present

## 2021-06-16 DIAGNOSIS — I25118 Atherosclerotic heart disease of native coronary artery with other forms of angina pectoris: Secondary | ICD-10-CM | POA: Diagnosis not present

## 2021-06-16 DIAGNOSIS — I4821 Permanent atrial fibrillation: Secondary | ICD-10-CM

## 2021-06-16 DIAGNOSIS — I7143 Infrarenal abdominal aortic aneurysm, without rupture: Secondary | ICD-10-CM

## 2021-06-16 DIAGNOSIS — E782 Mixed hyperlipidemia: Secondary | ICD-10-CM

## 2021-06-17 ENCOUNTER — Encounter (INDEPENDENT_AMBULATORY_CARE_PROVIDER_SITE_OTHER): Payer: Self-pay | Admitting: Vascular Surgery

## 2021-06-17 NOTE — Progress Notes (Signed)
MRN : [id number]  Leslie Prince is a 76 y.o. (06-Mar-1946) male who presents with chief complaint of check AAA.  History of Present Illness:  The patient returns to the office for surveillance of an abdominal aortic aneurysm status post stent graft placement on 11/26/2019.    Patient denies abdominal pain or back pain, no other abdominal complaints. No groin related complaints. No symptoms consistent with distal embolization No changes in claudication distance.    There have been no interval changes in his overall healthcare since his last visit.    Patient denies amaurosis fugax or TIA symptoms. There is no history of claudication or rest pain symptoms of the lower extremities. The patient denies angina or shortness of breath.    Duplex US of the aorta and iliac arteries shows a 5.37 AAA sac with no  endoleak, previous sac measurement was 4.64 cm.  Current Meds  Medication Sig   albuterol (PROVENTIL) (2.5 MG/3ML) 0.083% nebulizer solution Take 2.5 mg by nebulization every 6 (six) hours as needed for wheezing.   albuterol (VENTOLIN HFA) 108 (90 Base) MCG/ACT inhaler Inhale 2 puffs into the lungs every 6 (six) hours as needed for wheezing.    allopurinol (ZYLOPRIM) 100 MG tablet Take 100 mg by mouth daily.   atorvastatin (LIPITOR) 20 MG tablet Take 20 mg by mouth at bedtime.    Calcium Carb-Cholecalciferol (CALCIUM 600 + D PO) Take by mouth 2 (two) times daily.   colchicine 0.6 MG tablet Take 0.6 mg by mouth as directed. Take 2 tablets at onset of gout flare, then 1 tablet one hour later   dabigatran (PRADAXA) 150 MG CAPS capsule Take 1 capsule (150 mg total) by mouth 2 (two) times daily.   enalapril (VASOTEC) 20 MG tablet Take 1 tablet (20 mg total) by mouth 2 (two) times daily.   fluticasone (FLONASE) 50 MCG/ACT nasal spray Place into both nostrils daily.   Fluticasone-Salmeterol (ADVAIR) 250-50 MCG/DOSE AEPB Inhale 1 puff into the lungs 2 (two) times daily.    furosemide (LASIX) 40 MG  tablet Take 40 mg by mouth daily.    KLOR-CON M10 10 MEQ tablet Take 2 tablets (20 mEq total) by mouth daily.   metoprolol succinate (TOPROL-XL) 100 MG 24 hr tablet Take 100 mg by mouth daily.   Misc Natural Products (TART CHERRY ADVANCED PO) Take by mouth daily.    montelukast (SINGULAIR) 10 MG tablet Take 10 mg by mouth at bedtime.    Multiple Vitamins tablet Take 1 tablet by mouth daily.    mupirocin ointment (BACTROBAN) 2 % Apply topically 3 (three) times daily.   sildenafil (VIAGRA) 100 MG tablet Take 1 tablet (100 mg total) by mouth daily as needed for erectile dysfunction.   tamsulosin (FLOMAX) 0.4 MG CAPS capsule TAKE 1 CAPSULE (0.4 MG TOTAL) BY MOUTH DAILY AFTER SUPPER.   tiotropium (SPIRIVA) 18 MCG inhalation capsule Place 18 mcg into inhaler and inhale daily.    Past Medical History:  Diagnosis Date   Atrial fibrillation ([facility])    Cancer [facility])    prostate cancer   CHF (congestive heart failure) (H[facility])    COPD (chronic obstructive pulmonary disease) ([facility])    Coronary artery disease    Depression    Hypertension    Sleep apnea     Past Surgical History:  Procedure Laterality Date   CARDIAC SURGERY     CARDIAC VALVE REPLACEMENT     CARDIOVERSION  2013   CATARACT EXTRACTION W/PHACO Right 08/06/2019  Procedure: CATARACT EXTRACTION PHACO AND INTRAOCULAR LENS PLACEMENT (IOC) RIGHT 12.34  01:25.4  14.5%;  Surgeon: Leandrew Koyanagi, MD;  Location: [facility];  Service: Ophthalmology;  Laterality: Right;   CATARACT EXTRACTION W/PHACO Left 08/27/2019   Procedure: CATARACT EXTRACTION PHACO AND INTRAOCULAR LENS PLACEMENT (IOC) LEFT 7.92 01:16.9 10.3%;  Surgeon: Leandrew Koyanagi, MD;  Location: [facility];  Service: Ophthalmology;  Laterality: Left;  sleep apnea   COLONOSCOPY WITH PROPOFOL N/A 11/07/2017   Procedure: COLONOSCOPY WITH PROPOFOL;  Surgeon: Manya Silvas, MD;  Location: [facility];  Service: Endoscopy;  Laterality: N/A;   CORONARY  ANGIOPLASTY WITH STENT PLACEMENT  2011   CORONARY ARTERY BYPASS GRAFT     during mitral valve repair   ENDOVASCULAR REPAIR/STENT GRAFT N/A 11/26/2019   Procedure: ENDOVASCULAR REPAIR/STENT GRAFT;  Surgeon: Katha Cabal, MD;  Location: [facility];  Service: Cardiovascular;  Laterality: N/A;   MITRAL VALVE REPAIR  1997    Social History Social History   Tobacco Use   Smoking status: Former    Packs/day: 1.00    Years: 25.00    Pack years: 25.00    Types: Cigarettes    Quit date: 06/21/1995    Years since quitting: 26.0   Smokeless tobacco: Never  Vaping Use   Vaping Use: Never used  Substance Use Topics   Alcohol use: Not Currently    Comment: Rare   Drug use: Never    Family History Family History  Problem Relation Age of Onset   Hypertension Mother    Valvular heart disease Father     Allergies  Allergen Reactions   Lisinopril Other (See Comments)    Severe urination Other reaction(s): Other (See Comments), Other (See Comments) Severe urination      REVIEW OF SYSTEMS (Negative unless checked)  Constitutional: '[]'$ Weight loss  '[]'$ Fever  '[]'$ Chills Cardiac: '[]'$ Chest pain   '[]'$ Chest pressure   '[]'$ Palpitations   '[]'$ Shortness of breath when laying flat   '[]'$ Shortness of breath with exertion. Vascular:  '[]'$ Pain in legs with walking   '[]'$ Pain in legs at rest  '[]'$ History of DVT   '[]'$ Phlebitis   '[]'$ Swelling in legs   '[]'$ Varicose veins   '[]'$ Non-healing ulcers Pulmonary:   '[]'$ Uses home oxygen   '[]'$ Productive cough   '[]'$ Hemoptysis   '[]'$ Wheeze  '[]'$ COPD   '[]'$ Asthma Neurologic:  '[]'$ Dizziness   '[]'$ Seizures   '[]'$ History of stroke   '[]'$ History of TIA  '[]'$ Aphasia   '[]'$ Vissual changes   '[]'$ Weakness or numbness in arm   '[]'$ Weakness or numbness in leg Musculoskeletal:   '[]'$ Joint swelling   '[]'$ Joint pain   '[]'$ Low back pain Hematologic:  '[]'$ Easy bruising  '[]'$ Easy bleeding   '[]'$ Hypercoagulable state   '[]'$ Anemic Gastrointestinal:  '[]'$ Diarrhea   '[]'$ Vomiting  '[]'$ Gastroesophageal reflux/heartburn   '[]'$ Difficulty  swallowing. Genitourinary:  '[]'$ Chronic kidney disease   '[]'$ Difficult urination  '[]'$ Frequent urination   '[]'$ Blood in urine Skin:  '[]'$ Rashes   '[]'$ Ulcers  Psychological:  '[]'$ History of anxiety   '[]'$  History of major depression.  Physical Examination  Vitals:   06/16/21 1115  BP: 118/74  Pulse: 76  Resp: 17  Weight: 279 lb (126.6 kg)  Height: '5\' 9"'$  (1.753 m)   Body mass index is 41.2 kg/m. Gen: WD/WN, NAD Head: [place]/AT, No temporalis wasting.  Ear/Nose/Throat: Hearing grossly intact, nares w/o erythema or drainage Eyes: PER, EOMI, sclera nonicteric.  Neck: Supple, no masses.  No bruit or JVD.  Pulmonary:  Good air movement, no audible wheezing, no use of accessory muscles.  Cardiac:  RRR, normal S1, S2, no Murmurs. Vascular:   Vessel Right Left  Radial Palpable Palpable  PT Palpable Palpable  DP Palpable Palpable  Gastrointestinal: soft, non-distended. No guarding/no peritoneal signs.  Musculoskeletal: M/S 5/5 throughout.  No visible deformity.  Neurologic: CN 2-12 intact. Pain and light touch intact in extremities.  Symmetrical.  Speech is fluent. Motor exam as listed above. Psychiatric: Judgment intact, Mood & affect appropriate for pt's clinical situation. Dermatologic: No rashes or ulcers noted.  No changes consistent with cellulitis.   CBC Lab Results  Component Value Date   WBC 11.8 (H) 11/27/2019   HGB 11.0 (L) 11/27/2019   HCT 32.9 (L) 11/27/2019   MCV 94.3 11/27/2019   PLT 125 (L) 11/27/2019    BMET    Component Value Date/Time   NA 141 09/13/2020 1252   K 3.9 09/13/2020 1252   CL 106 09/13/2020 1252   CO2 27 09/13/2020 1252   GLUCOSE 103 (H) 09/13/2020 1252   BUN 16 09/13/2020 1252   CREATININE 1.00 09/13/2020 1252   CALCIUM 9.4 09/13/2020 1252   GFRNONAA >60 09/13/2020 1252   GFRAA >60 11/27/2019 0545   CrCl cannot be calculated (Patient's most recent lab result is older than the maximum 21 days allowed.).  COAG Lab Results  Component Value Date   INR 2.0  (H) 11/19/2019   INR 1.7 (H) 11/15/2018    Radiology VAS [place] ABI WITH/WO TBI  Result Date: 06/16/2021  LOWER EXTREMITY DOPPLER STUDY Patient Name:  LANKFORD GUTZMER  Date of Exam:   06/16/2021 Medical Rec #: [id number]        Accession #:    [id number] Date of Birth: 1946-04-02         Patient Gender: M Patient Age:   30 years Exam Location:  [facility] Procedure:      VAS [place] ABI WITH/WO TBI Referring Phys: [name] --------------------------------------------------------------------------------  Indications: Rest pain.  Performing Technologist: Almira Coaster RVS  Examination Guidelines: A complete evaluation includes at minimum, Doppler waveform signals and systolic blood pressure reading at the level of bilateral brachial, anterior tibial, and posterior tibial arteries, when vessel segments are accessible. Bilateral testing is considered an integral part of a complete examination. Photoelectric Plethysmograph (PPG) waveforms and toe systolic pressure readings are included as required and additional duplex testing as needed. Limited examinations for reoccurring indications may be performed as noted.  ABI Findings: +---------+------------------+-----+---------+--------+  Right     Rt Pressure (mmHg) Index Waveform  Comment   +---------+------------------+-----+---------+--------+  Brachial  146                                          +---------+------------------+-----+---------+--------+  ATA       185                1.27  triphasic           +---------+------------------+-----+---------+--------+  PTA       169                1.16  triphasic           +---------+------------------+-----+---------+--------+  Great Toe 149                1.02  Normal              +---------+------------------+-----+---------+--------+ +---------+------------------+-----+---------+-------+  Left      Lt Pressure (  mmHg) Index Waveform  Comment  +---------+------------------+-----+---------+-------+  Brachial   137                                         +---------+------------------+-----+---------+-------+  ATA       172                1.18  triphasic          +---------+------------------+-----+---------+-------+  PTA       205                1.40  triphasic          +---------+------------------+-----+---------+-------+  Great Toe 157                1.08  Normal             +---------+------------------+-----+---------+-------+ +-------+-----------+-----------+------------+------------+  ABI/TBI Today's ABI Today's TBI Previous ABI Previous TBI  +-------+-----------+-----------+------------+------------+  Right   1.27        1.02                                   +-------+-----------+-----------+------------+------------+  Left    1.40        1.08                                   +-------+-----------+-----------+------------+------------+  Summary: Right: Resting right ankle-brachial index is within normal range. No evidence of significant right lower extremity arterial disease. The right toe-brachial index is normal. Left: Resting left ankle-brachial index is within normal range. No evidence of significant left lower extremity arterial disease. The left toe-brachial index is normal.  *See table(s) above for measurements and observations.  Electronically signed by [name] MD on 06/16/2021 at 4:36:28 PM.    Final    VAS US AORTA/IVC/ILIACS  Result Date: 06/16/2021 Endovascular Aortic Repair Study (EVAR) Patient Name:  MARSDEN ZAINO  Date of Exam:   06/16/2021 Medical Rec #: [id number]        Accession #:    [id number] Date of Birth: 1945-12-02         Patient Gender: M Patient Age:   11 years Exam Location:  [facility] Procedure:      VAS US AORTA/IVC/ILIACS Referring Phys: [name] --------------------------------------------------------------------------------  Indications: Follow up exam for EVAR. Vascular Interventions: 11/26/2019 EVAR.  Comparison Study: 06/17/2020 Performing  Technologist: Almira Coaster RVS  Examination Guidelines: A complete evaluation includes B-mode imaging, spectral Doppler, color Doppler, and power Doppler as needed of all accessible portions of each vessel. Bilateral testing is considered an integral part of a complete examination. Limited examinations for reoccurring indications may be performed as noted.  Endovascular Aortic Repair (EVAR): +----------+----------------+-------------------+-------------------+             Diameter AP (cm) Diameter Trans (cm) Velocities (cm/sec)  +----------+----------------+-------------------+-------------------+  Aorta      5.08             5.37                87                   +----------+----------------+-------------------+-------------------+  Right Limb 1.42  1.45                86                   +----------+----------------+-------------------+-------------------+  Left Limb  1.37             1.32                78                   +----------+----------------+-------------------+-------------------+  Summary: Abdominal Aorta: Patent endovascular aneurysm repair with no evidence of endoleak. The EVAR appears to be increased compared to prior study on 06/17/2020; 4.64 cms to 5.37 cms Today.  *See table(s) above for measurements and observations.  Electronically signed by [name] MD on 06/16/2021 at 4:41:33 PM.   Final      Assessment/Plan 1. Infrarenal abdominal aortic aneurysm (AAA) without rupture Recommend: Patient is status post successful endovascular repair of the AAA.   No further intervention is required at this time.   No endoleak is detected but the aneurysm sac has increased slightly.  The patient will continue antiplatelet therapy as prescribed as well as aggressive management of hyperlipidemia. Exercise is again strongly encouraged.   However, endografts require continued surveillance with ultrasound or CT scan. This is mandatory to detect any changes that allow  repressurization of the aneurysm sac.  The patient is informed that this would be asymptomatic.  The patient is reminded that lifelong routine surveillance is a necessity with an endograft. Patient will continue to follow-up at 6 month intervals with ultrasound of the aorta based on the increase in AAA sac size.   - VAS US AORTA/IVC/ILIACS; Future  2. PAD (peripheral artery disease) (HCC)  Recommend:  The patient has evidence of atherosclerosis of the lower extremities with claudication.  The patient does not voice lifestyle limiting changes at this point in time.  Noninvasive studies do not suggest clinically significant change.  No invasive studies, angiography or surgery at this time The patient should continue walking and begin a more formal exercise program.  The patient should continue antiplatelet therapy and aggressive treatment of the lipid abnormalities  No changes in the patient's medications at this time  The patient should continue wearing graduated compression socks 10-15 mmHg strength to control the mild edema.    3. Coronary artery disease of native artery of native heart with stable angina pectoris (HCC) Continue cardiac and antihypertensive medications as already ordered and reviewed, no changes at this time.  Continue statin as ordered and reviewed, no changes at this time  Nitrates PRN for chest pain   4. Permanent atrial fibrillation (HCC) Continue antiarrhythmia medications as already ordered, these medications have been reviewed and there are no changes at this time.  Continue anticoagulation as ordered by Cardiology Service   5. Mixed hyperlipidemia Continue statin as ordered and reviewed, no changes at this time     [name], MD  06/17/2021 9:15 AM

## 2021-07-05 ENCOUNTER — Telehealth: Payer: Self-pay | Admitting: Cardiology

## 2021-07-05 NOTE — Telephone Encounter (Signed)
? ?  Patient Name: Leslie Prince  ?DOB: 11-14-1945 ?MRN: [id number] ? ?Primary Cardiologist: Kate Sable, MD ? ?Chart reviewed as part of pre-operative protocol coverage.  ? ?Though procedure itself is relatively low risk, the patient already has an upcoming appointment scheduled 07/18/21 at 11:20am with Dr. Garen Lah at which time this clearance can be addressed in case there are any new issues addressed that would impact pre-operative recommendations. (Procedure date of 09/28/21 falls after appointment.) ? ?I added preop FYI to appointment notes so that provider is aware to address at time of OV. Will route to pharm for input on Pradaxa - not outlined on clearance below but anticipate they would need to hold for colonoscopy. Per office protocol, the provider seeing this patient should forward their finalized clearance decision to requesting party below.  ? ?Will fax FYI to GI team so they are aware that appt forthcoming. Will remove from preop box as separate preop APP input not necessary at this time. ? ?Charlie Pitter, PA-C ?07/05/2021, 4:18 PM ? ? ?

## 2021-07-05 NOTE — Telephone Encounter (Signed)
? ?  Pre-operative Risk Assessment  ?  ?Patient Name: ROWLAND ERICSSON  ?DOB: 09-17-1945 ?MRN: [id number]  ? ?  ? ?Request for Surgical Clearance   ? ?Procedure:   colonoscopy ? ?Date of Surgery:  Clearance 09/28/21                              ?   ?Surgeon:  not noted    ?Surgeon's Group or Practice Name:  [facility]  ?Phone number:  [phone] ?Fax number:  [phone] ?  ?Type of Clearance Requested:   ?- Medical  ?-Pharmacy please advise  ?  ?Type of Anesthesia:  note noted  ?  ?Additional requests/questions:   ? ?Signed, ?Clarisse Gouge   ?07/05/2021, 2:46 PM  ? ?

## 2021-07-06 NOTE — Telephone Encounter (Signed)
Patient needs updated CBC and BMET at next appt (last BMET will be 1 yr by time of procedure ?

## 2021-07-06 NOTE — Telephone Encounter (Signed)
Patient has upcoming appointment at which time the lab works can be obtained, unless MD wants the lab work ahead of the time.  ?

## 2021-07-18 ENCOUNTER — Ambulatory Visit: Payer: Medicare PPO | Admitting: Cardiology

## 2021-07-26 ENCOUNTER — Other Ambulatory Visit: Payer: Self-pay

## 2021-07-26 DIAGNOSIS — N521 Erectile dysfunction due to diseases classified elsewhere: Secondary | ICD-10-CM

## 2021-07-26 MED ORDER — SILDENAFIL CITRATE 100 MG PO TABS: 100.0000 mg | ORAL_TABLET | Freq: Every day | ORAL | 11 refills | Status: AC | PRN

## 2021-07-26 NOTE — Telephone Encounter (Signed)
Does not need to continue vitamin D and calcium supplementation now that he has completed ADT ? ?Leslie Madrid, MD ?07/26/2021 ? ?

## 2021-07-29 ENCOUNTER — Ambulatory Visit: Payer: Medicare PPO | Admitting: Cardiology

## 2021-07-29 ENCOUNTER — Encounter: Payer: Self-pay | Admitting: Cardiology

## 2021-07-29 VITALS — BP 140/70 | HR 74 | Ht 69.0 in | Wt 283.0 lb

## 2021-07-29 DIAGNOSIS — I4821 Permanent atrial fibrillation: Secondary | ICD-10-CM | POA: Diagnosis not present

## 2021-07-29 DIAGNOSIS — I10 Essential (primary) hypertension: Secondary | ICD-10-CM

## 2021-07-29 DIAGNOSIS — I77810 Thoracic aortic ectasia: Secondary | ICD-10-CM | POA: Diagnosis not present

## 2021-07-29 DIAGNOSIS — Z951 Presence of aortocoronary bypass graft: Secondary | ICD-10-CM | POA: Diagnosis not present

## 2021-07-29 NOTE — Patient Instructions (Signed)
Medication Instructions:  ? ?Your physician recommends that you continue on your current medications as directed. Please refer to the Current Medication list given to you today. ? ?*If you need a refill on your cardiac medications before your next appointment, please call your pharmacy* ? ? ?Lab Work: ?None ordered ? ?If you have labs (blood work) drawn today and your tests are completely normal, you will receive your results only by: ?MyChart Message (if you have MyChart) OR ?A paper copy in the mail ?If you have any lab test that is abnormal or we need to change your treatment, we will call you to review the results. ? ? ?Testing/Procedures: ?None ordered ? ? ?Follow-Up: ?At [facility], you and your health needs are our priority.  As part of our continuing mission to provide you with exceptional heart care, we have created designated Provider Care Teams.  These Care Teams include your primary Cardiologist (physician) and Advanced Practice Providers (APPs -  Physician Assistants and Nurse Practitioners) who all work together to provide you with the care you need, when you need it. ? ?We recommend signing up for the patient portal called "MyChart".  Sign up information is provided on this After Visit Summary.  MyChart is used to connect with patients for Virtual Visits (Telemedicine).  Patients are able to view lab/test results, encounter notes, upcoming appointments, etc.  Non-urgent messages can be sent to your provider as well.   ?To learn more about what you can do with MyChart, go to [email or web address].   ? ?Your next appointment:   ?6 month(s) ? ?The format for your next appointment:   ?In Person ? ?Provider:   ?You may see Kate Sable, MD or one of the following Advanced Practice Providers on your designated Care Team:   ?Murray Hodgkins, NP ?Christell Faith, PA-C ?Cadence Kathlen Mody, PA-C  ? ? ?Other Instructions ? ? ?Important Information About Sugar ? ? ? ? ?  ?

## 2021-07-29 NOTE — Progress Notes (Signed)
?Cardiology Office Note:   ? ?Date:  07/29/2021  ? ?ID:  Leslie Prince, DOB 10-03-1945, MRN [id number] ? ?PCP:  [name], MD  ?[place] Cardiologist:  Kate Sable, MD  ?[facility] Electrophysiologist:  None  ? ?Referring MD: [name], MD  ? ?Chief Complaint  ?Patient presents with  ? OTher  ?  6 month follow up -- Meds reviewed verbally with patient.   ? ? ?History of Present Illness:   ? ?Leslie Prince is a 76 y.o. male with a hx of chronic atrial fibrillation on Pradaxa, CAD PCI w/ DES to LCx 2011, CABG x 3 in 1997, mitral valve repair 1997, AAA s/p endovascular stent repair 11/2019, hypertension, former smoker, COPD who presents for follow-up.   ? ?Patient being seen due to CAD, atrial fibrillation and hypertension.  Feels okay, has no current issues.  Plans to follow-up with pulmonary medicine in order to obtain screening chest CT for lung cancer due to prior smoking history.  Also has upcoming appointment with vascular surgery for endovascular stent for AAA repair.  Denies chest pain, current shortness of breath when he overexerts himself.  Denies palpitations.  Bruises easily when he bumps into objects.  Otherwise tolerating Pradaxa with no significant bleeding. ? ? ?Prior notes ?Patient had a recent abdominal CT showing abdominal aortic aneurysm up to 5 cm.  Endovascular repair by vascular surgery was performed on 11/27/2019.   ?Echocardiogram 11/2019 showed low normal ejection fraction, EF 50 to 55%.  Mild ascending aorta dilatation, 42 mm. ? ? ?Past Medical History:  ?Diagnosis Date  ? Atrial fibrillation ([place])   ? Cancer [facility])   ? prostate cancer  ? CHF (congestive heart failure) ([place])   ? COPD (chronic obstructive pulmonary disease) ([place])   ? Coronary artery disease   ? Depression   ? Hypertension   ? Sleep apnea   ? ? ?Past Surgical History:  ?Procedure Laterality Date  ? CARDIAC SURGERY    ? CARDIAC VALVE REPLACEMENT    ? CARDIOVERSION  2013  ? CATARACT EXTRACTION W/PHACO  Right 08/06/2019  ? Procedure: CATARACT EXTRACTION PHACO AND INTRAOCULAR LENS PLACEMENT (IOC) RIGHT 12.34  01:25.4  14.5%;  Surgeon: Leandrew Koyanagi, MD;  Location: [place];  Service: Ophthalmology;  Laterality: Right;  ? CATARACT EXTRACTION W/PHACO Left 08/27/2019  ? Procedure: CATARACT EXTRACTION PHACO AND INTRAOCULAR LENS PLACEMENT (IOC) LEFT 7.92 01:16.9 10.3%;  Surgeon: Leandrew Koyanagi, MD;  Location: [place];  Service: Ophthalmology;  Laterality: Left;  sleep apnea  ? COLONOSCOPY WITH PROPOFOL N/A 11/07/2017  ? Procedure: COLONOSCOPY WITH PROPOFOL;  Surgeon: Manya Silvas, MD;  Location: [facility] ENDOSCOPY;  Service: Endoscopy;  Laterality: N/A;  ? CORONARY ANGIOPLASTY WITH STENT PLACEMENT  2011  ? CORONARY ARTERY BYPASS GRAFT    ? during mitral valve repair  ? ENDOVASCULAR REPAIR/STENT GRAFT N/A 11/26/2019  ? Procedure: ENDOVASCULAR REPAIR/STENT GRAFT;  Surgeon: Katha Cabal, MD;  Location: [place];  Service: Cardiovascular;  Laterality: N/A;  ? MITRAL VALVE REPAIR  1997  ? ? ?Current Medications: ?Current Meds  ?Medication Sig  ? albuterol (PROVENTIL) (2.5 MG/3ML) 0.083% nebulizer solution Take 2.5 mg by nebulization every 6 (six) hours as needed for wheezing.  ? albuterol (VENTOLIN HFA) 108 (90 Base) MCG/ACT inhaler Inhale 2 puffs into the lungs every 6 (six) hours as needed for wheezing.   ? allopurinol (ZYLOPRIM) 100 MG tablet Take 100 mg by mouth daily.  ? atorvastatin (LIPITOR) 20 MG tablet  Take 20 mg by mouth at bedtime.   ? colchicine 0.6 MG tablet Take 0.6 mg by mouth as directed. Take 2 tablets at onset of gout flare, then 1 tablet one hour later  ? dabigatran (PRADAXA) 150 MG CAPS capsule Take 1 capsule (150 mg total) by mouth 2 (two) times daily.  ? enalapril (VASOTEC) 20 MG tablet Take 1 tablet (20 mg total) by mouth 2 (two) times daily.  ? fluticasone (FLONASE) 50 MCG/ACT nasal spray Place into both nostrils daily.  ? Fluticasone-Salmeterol  (ADVAIR) 250-50 MCG/DOSE AEPB Inhale 1 puff into the lungs 2 (two) times daily.   ? furosemide (LASIX) 40 MG tablet Take 40 mg by mouth daily.   ? KLOR-CON M10 10 MEQ tablet Take 2 tablets (20 mEq total) by mouth daily.  ? metoprolol succinate (TOPROL-XL) 100 MG 24 hr tablet Take 100 mg by mouth daily.  ? Misc Natural Products (TART CHERRY ADVANCED PO) Take by mouth daily.   ? montelukast (SINGULAIR) 10 MG tablet Take 10 mg by mouth at bedtime.   ? Multiple Vitamins tablet Take 1 tablet by mouth daily.   ? sildenafil (VIAGRA) 100 MG tablet Take 1 tablet (100 mg total) by mouth daily as needed for erectile dysfunction.  ? tamsulosin (FLOMAX) 0.4 MG CAPS capsule TAKE 1 CAPSULE (0.4 MG TOTAL) BY MOUTH DAILY AFTER SUPPER.  ? tiotropium (SPIRIVA) 18 MCG inhalation capsule Place 18 mcg into inhaler and inhale daily.  ?  ? ?Allergies:   Lisinopril  ? ?Social History  ? ?Socioeconomic History  ? Marital status: Married  ?  Spouse name: Not on file  ? Number of children: Not on file  ? Years of education: Not on file  ? Highest education level: Not on file  ?Occupational History  ? Not on file  ?Tobacco Use  ? Smoking status: Former  ?  Packs/day: 1.00  ?  Years: 25.00  ?  Pack years: 25.00  ?  Types: Cigarettes  ?  Quit date: 06/21/1995  ?  Years since quitting: 26.1  ? Smokeless tobacco: Never  ?Vaping Use  ? Vaping Use: Never used  ?Substance and Sexual Activity  ? Alcohol use: Not Currently  ?  Comment: Rare  ? Drug use: Never  ? Sexual activity: Yes  ?  Birth control/protection: None  ?Other Topics Concern  ? Not on file  ?Social History Narrative  ? Not on file  ? ?Social Determinants of Health  ? ?Financial Resource Strain: Not on file  ?Food Insecurity: Not on file  ?Transportation Needs: Not on file  ?Physical Activity: Not on file  ?Stress: Not on file  ?Social Connections: Not on file  ?  ? ?Family History: ?The patient's family history includes Hypertension in his mother; Valvular heart disease in his  father. ? ?ROS:   ?Please see the history of present illness.    ? All other systems reviewed and are negative. ? ?EKGs/Labs/Other Studies Reviewed:   ? ?The following studies were reviewed today: ? ? ?EKG:  EKG is ordered today.  EKG shows atrial fibrillation, heart rate 74. ? ?Recent Labs: ?09/13/2020: BUN 16; Creatinine, Ser 1.00; Potassium 3.9; Sodium 141  ?Recent Lipid Panel ?No results found for: CHOL, TRIG, HDL, CHOLHDL, VLDL, LDLCALC, LDLDIRECT ? ?Physical Exam:   ? ?VS:  BP 140/70 (BP Location: Left Arm, Patient Position: Sitting, Cuff Size: Normal)   Pulse 74   Ht '5\' 9"'$  (1.753 m)   Wt 283 lb (128.4 kg)  SpO2 94%   BMI 41.79 kg/m?    ? ?Wt Readings from Last 3 Encounters:  ?07/29/21 283 lb (128.4 kg)  ?06/16/21 279 lb (126.6 kg)  ?06/02/21 (P) 285 lb 9.6 oz (129.5 kg)  ?  ? ?GEN:  Well nourished, well developed in no acute distress ?HEENT: Normal ?NECK: No JVD; No carotid bruits ?CARDIAC: Irregular irregular, no murmurs, rubs, gallops ?RESPIRATORY: Diminished breath sounds at bases ?ABDOMEN: Soft, non-tender, non-distended ?MUSCULOSKELETAL:  No edema; No deformity  ?SKIN: Warm and dry ?NEUROLOGIC:  Alert and oriented x 3 ?PSYCHIATRIC:  Normal affect  ? ?ASSESSMENT:   ? ?1. Hx of CABG   ?2. Permanent atrial fibrillation ([place])   ?3. Primary hypertension   ?4. Ascending aorta dilatation (HCC)   ? ?PLAN:   ? ?In order of problems listed above: ? ?CAD/CABG x 3 1997, PCI to the left circumflex.  Denies chest pain.  Echo on 11/2019 preserved, EF 50 to 55%. Cont statin, beta-blocker, Pradaxa.  Repeat echo.  History of mitral valve repair. ?permanent atrial fibrillation, heart rate controlled.  Continue Toprol-XL and Pradaxa.  ?hypertension, BP elevated, usually controlled   Continue Toprol, enalapril.   ?Ascending aortic dilation, mild, 4.2cm.  Serial monitoring with CT scans obtained for lung cancer screening. ? ?Follow-up in 6 months  ? ?Total encounter 35 minutes ? Greater than 50% was spent in counseling and  coordination of care with the patient ? ?Medication Adjustments/Labs and Tests Ordered: ?Current medicines are reviewed at length with the patient today.  Concerns regarding medicines are outlined above.  ?Orders Placed Thi

## 2021-08-05 ENCOUNTER — Telehealth: Payer: Self-pay | Admitting: Cardiology

## 2021-08-05 NOTE — Telephone Encounter (Signed)
? ?  Pre-operative Risk Assessment  ?  ?Patient Name: DEMETRIOUS RAINFORD  ?DOB: [birth year] ?MRN: [id number]  ? ?  ? ?Request for Surgical Clearance   ? ?Procedure:  Colonscopy ? ?Date of Surgery:  Clearance 09/28/21                              ?   ?Surgeon:  not listed ?Surgeon's Group or Practice Name:  [facility] ?Phone number:  [phone] ?Fax number:  [phone] ?  ?Type of Clearance Requested:  Pharmacy:  Hold Dabigatran (Pradaxa)  ? ?5. What type of anesthesia will be used?  Press F2 and select the anesthesia to be used for the procedure.  :1}  ?Type of Anesthesia:  Not Indicated ?  ?Additional requests/questions:   ? ?Signed, ?Pilar A Ham   ?08/05/2021, 4:51 PM   ?

## 2021-08-08 NOTE — Telephone Encounter (Signed)
Per pharm team - Please inform patient BMET, CBC needed for Pradaxa monitoring for preop input prior to clearance. ?

## 2021-08-08 NOTE — Telephone Encounter (Signed)
Will route to pharm for input on anticoag. Will plan to reference prior clearance note and recent OV when bundling. ?

## 2021-08-08 NOTE — Telephone Encounter (Signed)
Prior clearance request in 3/28 note, comment was added that pt needed annual BMET and CBC for Pradaxa monitoring in order to determine clearance hold. Pt saw MD on 4/21 however he stated that labs should be ordered by provider performing procedure... pt still has not had labs checked.  ?

## 2021-08-09 NOTE — Telephone Encounter (Signed)
Tried to call the pt to set up labs for pre op clearance, BMET, CBC. Pt vm is full could not leave message. I will see if the [facility] office may help set up lab appt as well. Pt has appt 08/17/21 for Echo to be done in the office, possibly the lab work may be done the same day.  ?

## 2021-08-09 NOTE — Telephone Encounter (Signed)
RE: LABS FOR PRE OP CLEARANCE ?Received: Today ?Gerre Scull, CMA ?Leslie Prince  ? ?We no longer do labs in the office .  Right now patients can go anytime to the medical mall as a walk in.    ? ?I will add a reminder note for the echo day to let him know.  ? ?Delsa Sale   ?  ?   ? ?

## 2021-08-10 ENCOUNTER — Other Ambulatory Visit: Payer: Self-pay

## 2021-08-10 DIAGNOSIS — Z01818 Encounter for other preprocedural examination: Secondary | ICD-10-CM

## 2021-08-10 NOTE — Progress Notes (Signed)
Per secure chat message with Dr. [name], okay to order BMP and CBC for patients pre-op as requested by pre-op pool. ?

## 2021-08-11 NOTE — Telephone Encounter (Signed)
Attempted to reach the patient but no answer; unable to leave a message voicemail box is full.  ?

## 2021-08-12 NOTE — Telephone Encounter (Signed)
Attempted to reach the patient again; his phone goes straight to voicemail and his voicemail box is full.  ? ?Contacted his wife who suggested we send him a Pharmacist, community message about his lab work. She states she  ?will tell him to check his mychart when she talks to him later.  ?

## 2021-08-17 ENCOUNTER — Ambulatory Visit (INDEPENDENT_AMBULATORY_CARE_PROVIDER_SITE_OTHER): Payer: Medicare PPO

## 2021-08-17 ENCOUNTER — Other Ambulatory Visit: Payer: Medicare PPO

## 2021-08-17 ENCOUNTER — Other Ambulatory Visit
Admission: RE | Admit: 2021-08-17 | Discharge: 2021-08-17 | Disposition: A | Discharge status: Home / Self Care | Payer: Medicare PPO | Source: Ambulatory Visit | Attending: Internal Medicine | Admitting: Internal Medicine

## 2021-08-17 DIAGNOSIS — Z01818 Encounter for other preprocedural examination: Secondary | ICD-10-CM | POA: Insufficient documentation

## 2021-08-17 DIAGNOSIS — Z951 Presence of aortocoronary bypass graft: Secondary | ICD-10-CM | POA: Diagnosis not present

## 2021-08-17 DIAGNOSIS — I77810 Thoracic aortic ectasia: Secondary | ICD-10-CM

## 2021-08-17 DIAGNOSIS — I4821 Permanent atrial fibrillation: Secondary | ICD-10-CM

## 2021-08-17 LAB — BASIC METABOLIC PANEL
Anion gap: 7 (ref 5–15)
BUN: 18 mg/dL (ref 8–23)
CO2: 25 mmol/L (ref 22–32)
Calcium: 9.3 mg/dL (ref 8.9–10.3)
Chloride: 105 mmol/L (ref 98–111)
Creatinine, Ser: 0.92 mg/dL (ref 0.61–1.24)
GFR, Estimated: >60 mL/min (ref >60)
Glucose, Bld: 94 mg/dL (ref 70–99)
Potassium: 4 mmol/L (ref 3.5–5.1)
Sodium: 137 mmol/L (ref 135–145)

## 2021-08-17 LAB — ECHOCARDIOGRAM COMPLETE
AR max vel: 4.46 cm2
AV Area VTI: 4.17 cm2
AV Area mean vel: 4.27 cm2
AV Mean grad: 3 mmHg
AV Peak grad: 5.9 mmHg
AV Vena cont: 0.4 cm
Ao pk vel: 1.21 m/s
MV VTI: 2.75 cm2
S' Lateral: 4.9 cm

## 2021-08-17 LAB — CBC
HCT: 35 % — ABNORMAL LOW (ref 39.0–52.0)
Hemoglobin: 11.5 g/dL — ABNORMAL LOW (ref 13.0–17.0)
MCH: 30.6 pg (ref 26.0–34.0)
MCHC: 32.9 g/dL (ref 30.0–36.0)
MCV: 93.1 fL (ref 80.0–100.0)
Platelets: 140 10*3/uL — ABNORMAL LOW (ref 150–400)
RBC: 3.76 MIL/uL — ABNORMAL LOW (ref 4.22–5.81)
RDW: 14.1 % (ref 11.5–15.5)
WBC: 5.6 10*3/uL (ref 4.0–10.5)
nRBC: 0 % (ref 0.0–0.2)

## 2021-08-17 MED ORDER — PERFLUTREN LIPID MICROSPHERE
1.0000 mL | INTRAVENOUS | Status: AC | PRN
  Administered 2021-08-17: 2 mL via INTRAVENOUS

## 2021-08-22 ENCOUNTER — Other Ambulatory Visit: Payer: Self-pay | Admitting: Radiation Oncology

## 2021-08-23 NOTE — Telephone Encounter (Signed)
? ?  Name: Leslie Prince  ?DOB: 1946-03-23  ?MRN: [id number]  ? ?Primary Cardiologist: Kate Sable, MD ? ?Chart reviewed as part of pre-operative protocol coverage. Patient was contacted 08/23/2021 in reference to pre-operative risk assessment for pending surgery as outlined below.  WEILAND TOMICH was last seen on 07/29/2021 by Dr. Garen Lah.  Since that day, KINGSLEY FARACE has done well from a cardiac standpoint. He denies any new or concerning symptoms. ? ?Therefore, based on ACC/AHA guidelines, the patient would be at acceptable risk for the planned procedure without further cardiovascular testing.  ? ?The patient was advised that if he develops new symptoms prior to surgery to contact our office to arrange for a follow-up visit, and he verbalized understanding. ? ?  ?Patient with diagnosis of afib on Pradaxa for anticoagulation.   ?  ?Procedure: colonoscopy ?Date of procedure: 09/28/21 ?  ?CHA2DS2-VASc Score = 5  ?This indicates a 7.2% annual risk of stroke. ?The patient's score is based upon: ?CHF History: 1 ?HTN History: 1 ?Diabetes History: 0 ?Stroke History: 0 ?Vascular Disease History: 1 ?Age Score: 2 ?Gender Score: 0 ?  ?CrCl 70m/min using adjusted body weight due to obesity ?Platelet count 140K ?  ?Per office protocol, patient can hold Pradaxa for 2 days prior to procedure.   ? ?I will route this recommendation to the requesting party via Epic fax function and remove from pre-op pool. Please call with questions. ? ?ELenna Sciara NP ?08/23/2021, 11:17 AM ? ?

## 2021-08-23 NOTE — Telephone Encounter (Signed)
Pt had labs checked on 5/10. ? ?Patient with diagnosis of afib on Pradaxa for anticoagulation.   ? ?Procedure: colonoscopy ?Date of procedure: 09/28/21 ? ?CHA2DS2-VASc Score = 5  ?This indicates a 7.2% annual risk of stroke. ?The patient's score is based upon: ?CHF History: 1 ?HTN History: 1 ?Diabetes History: 0 ?Stroke History: 0 ?Vascular Disease History: 1 ?Age Score: 2 ?Gender Score: 0 ? ?CrCl 52m/min using adjusted body weight due to obesity ?Platelet count 140K ? ?Per office protocol, patient can hold Pradaxa for 2 days prior to procedure.   ?

## 2021-09-26 ENCOUNTER — Other Ambulatory Visit: Payer: Self-pay | Admitting: *Deleted

## 2021-09-26 DIAGNOSIS — I4821 Permanent atrial fibrillation: Secondary | ICD-10-CM

## 2021-09-26 MED ORDER — DABIGATRAN ETEXILATE MESYLATE 150 MG PO CAPS: 150.0000 mg | ORAL_CAPSULE | Freq: Two times a day (BID) | ORAL | 0 refills | Status: DC

## 2021-09-26 NOTE — Telephone Encounter (Signed)
Prescription refill request for Pradaxa received.  Indication: a fib Last office visit: 07/29/21 Weight: 128kg Age: 76 Scr: 0.92 CrCl:

## 2021-09-27 ENCOUNTER — Encounter: Payer: Self-pay | Admitting: Internal Medicine

## 2021-09-28 ENCOUNTER — Ambulatory Visit
Admission: RE | Admit: 2021-09-28 | Discharge: 2021-09-28 | Disposition: A | Discharge status: Home / Self Care | Payer: Medicare PPO | Attending: Internal Medicine | Admitting: Internal Medicine

## 2021-09-28 ENCOUNTER — Ambulatory Visit: Payer: Medicare PPO | Admitting: Anesthesiology

## 2021-09-28 ENCOUNTER — Encounter
Admission: RE | Disposition: A | Discharge status: Home / Self Care | Payer: Self-pay | Source: Home / Self Care | Attending: Internal Medicine

## 2021-09-28 ENCOUNTER — Other Ambulatory Visit: Payer: Self-pay

## 2021-09-28 ENCOUNTER — Encounter: Payer: Self-pay | Admitting: Internal Medicine

## 2021-09-28 DIAGNOSIS — Z7901 Long term (current) use of anticoagulants: Secondary | ICD-10-CM | POA: Insufficient documentation

## 2021-09-28 DIAGNOSIS — J449 Chronic obstructive pulmonary disease, unspecified: Secondary | ICD-10-CM | POA: Insufficient documentation

## 2021-09-28 DIAGNOSIS — I739 Peripheral vascular disease, unspecified: Secondary | ICD-10-CM | POA: Insufficient documentation

## 2021-09-28 DIAGNOSIS — I2510 Atherosclerotic heart disease of native coronary artery without angina pectoris: Secondary | ICD-10-CM | POA: Diagnosis not present

## 2021-09-28 DIAGNOSIS — Z955 Presence of coronary angioplasty implant and graft: Secondary | ICD-10-CM | POA: Diagnosis not present

## 2021-09-28 DIAGNOSIS — I110 Hypertensive heart disease with heart failure: Secondary | ICD-10-CM | POA: Insufficient documentation

## 2021-09-28 DIAGNOSIS — Z09 Encounter for follow-up examination after completed treatment for conditions other than malignant neoplasm: Secondary | ICD-10-CM | POA: Insufficient documentation

## 2021-09-28 DIAGNOSIS — K5730 Diverticulosis of large intestine without perforation or abscess without bleeding: Secondary | ICD-10-CM | POA: Insufficient documentation

## 2021-09-28 DIAGNOSIS — Z8546 Personal history of malignant neoplasm of prostate: Secondary | ICD-10-CM | POA: Insufficient documentation

## 2021-09-28 DIAGNOSIS — I509 Heart failure, unspecified: Secondary | ICD-10-CM | POA: Diagnosis not present

## 2021-09-28 DIAGNOSIS — D123 Benign neoplasm of transverse colon: Secondary | ICD-10-CM | POA: Diagnosis not present

## 2021-09-28 DIAGNOSIS — I482 Chronic atrial fibrillation, unspecified: Secondary | ICD-10-CM | POA: Diagnosis not present

## 2021-09-28 DIAGNOSIS — Z923 Personal history of irradiation: Secondary | ICD-10-CM | POA: Diagnosis not present

## 2021-09-28 HISTORY — DX: Hepatitis a without hepatic coma: B15.9

## 2021-09-28 HISTORY — DX: Personal history of colon polyps, unspecified: Z86.0100

## 2021-09-28 HISTORY — DX: Personal history of colonic polyps: Z86.010

## 2021-09-28 HISTORY — DX: Rheumatic mitral valve disease, unspecified: I05.9

## 2021-09-28 HISTORY — DX: Hyperlipidemia, unspecified: E78.5

## 2021-09-28 SURGERY — COLONOSCOPY
Anesthesia: General

## 2021-09-28 MED ORDER — PROPOFOL 500 MG/50ML IV EMUL
INTRAVENOUS | Status: DC | PRN
  Administered 2021-09-28: 200 ug/kg/min via INTRAVENOUS

## 2021-09-28 MED ORDER — SODIUM CHLORIDE 0.9 % IV SOLN: INTRAVENOUS | Status: DC | PRN

## 2021-09-28 MED ORDER — SODIUM CHLORIDE 0.9 % IV SOLN: INTRAVENOUS | Status: DC

## 2021-09-28 MED ORDER — PROPOFOL 10 MG/ML IV BOLUS
INTRAVENOUS | Status: DC | PRN
  Administered 2021-09-28: 70 mg via INTRAVENOUS

## 2021-09-28 NOTE — H&P (Signed)
Outpatient short stay form Pre-procedure 09/28/2021 12:47 PM Leslie Prince Leslie Prince, M.D.  Primary Physician: [name], M.D.  Reason for visit:  Personal history of colon polyps  History of present illness:  76 y/o Caucasian male with a PMH of chronic atrial fibrillation on Pradaxa, CAD s/p PCI w/ DES 2011, CABG x3 in 1997, mitral valve repair 1997, AAA s/p endovascular repair 11/2019, PAD, HTN, COPD, Hx of prostate cancer s/p radiation therapy, and morbid obesity presents to the [facility] for high-risk colon cancer surveillance  1. Hx of adenomatous colon polyps 2. Sigmoid diverticulosis   -Patient is overdue for repeat surveillance at this time. Last colonoscopy 10/2017 performed by Dr. [name] removed five subcentimeter colon polyps with path showing TA x2, SSA x1, and hyperplastic polyps x2.    No current facility-administered medications for this encounter.  Current Outpatient Medications:    calcium carbonate (TUMS - DOSED IN MG ELEMENTAL CALCIUM) 500 MG chewable tablet, Chew 1 tablet by mouth 2 (two) times daily., Disp: , Rfl:    albuterol (PROVENTIL) (2.5 MG/3ML) 0.083% nebulizer solution, Take 2.5 mg by nebulization every 6 (six) hours as needed for wheezing., Disp: , Rfl:    albuterol (VENTOLIN HFA) 108 (90 Base) MCG/ACT inhaler, Inhale 2 puffs into the lungs every 6 (six) hours as needed for wheezing. , Disp: , Rfl:    allopurinol (ZYLOPRIM) 100 MG tablet, Take 100 mg by mouth daily., Disp: , Rfl:    atorvastatin (LIPITOR) 20 MG tablet, Take 20 mg by mouth at bedtime. , Disp: , Rfl: 1   colchicine 0.6 MG tablet, Take 0.6 mg by mouth as directed. Take 2 tablets at onset of gout flare, then 1 tablet one hour later, Disp: , Rfl:    dabigatran (PRADAXA) 150 MG CAPS capsule, Take 1 capsule (150 mg total) by mouth 2 (two) times daily., Disp: 180 capsule, Rfl: 0   enalapril (VASOTEC) 20 MG tablet, Take 1 tablet (20 mg total) by mouth 2 (two) times daily., Disp: 180 tablet, Rfl:  2   fluticasone (FLONASE) 50 MCG/ACT nasal spray, Place into both nostrils daily., Disp: , Rfl:    Fluticasone-Salmeterol (ADVAIR) 250-50 MCG/DOSE AEPB, Inhale 1 puff into the lungs 2 (two) times daily. , Disp: , Rfl:    furosemide (LASIX) 40 MG tablet, Take 40 mg by mouth daily. , Disp: , Rfl:    KLOR-CON M10 10 MEQ tablet, Take 2 tablets (20 mEq total) by mouth daily., Disp: 60 tablet, Rfl: 9   metoprolol succinate (TOPROL-XL) 100 MG 24 hr tablet, Take 100 mg by mouth daily., Disp: , Rfl:    Misc Natural Products (TART CHERRY ADVANCED PO), Take by mouth daily. , Disp: , Rfl:    montelukast (SINGULAIR) 10 MG tablet, Take 10 mg by mouth at bedtime. , Disp: , Rfl: 3   Multiple Vitamins tablet, Take 1 tablet by mouth daily. , Disp: , Rfl:    sildenafil (VIAGRA) 100 MG tablet, Take 1 tablet (100 mg total) by mouth daily as needed for erectile dysfunction., Disp: 15 tablet, Rfl: 11   tamsulosin (FLOMAX) 0.4 MG CAPS capsule, TAKE 1 CAPSULE (0.4 MG TOTAL) BY MOUTH DAILY AFTER SUPPER., Disp: 90 capsule, Rfl: 3   tiotropium (SPIRIVA) 18 MCG inhalation capsule, Place 18 mcg into inhaler and inhale daily., Disp: , Rfl:   No medications prior to admission.     Allergies  Allergen Reactions   Lisinopril Other (See Comments)    Severe urination Other reaction(s): Other (See Comments),  Other (See Comments) Severe urination      Past Medical History:  Diagnosis Date   Atrial fibrillation ([place])    Cancer (HCC)    prostate cancer   CHF (congestive heart failure) (HCC)    COPD (chronic obstructive pulmonary disease) (HCC)    Coronary artery disease    Depression    Hepatitis A infection    History of colon polyps    Hyperlipidemia    Hypertension    Mitral valve problem    Sleep apnea     Review of systems:  Otherwise negative.    Physical Exam  Gen: Alert, oriented. Appears stated age.  HEENT: [place]/AT. PERRLA. Lungs: CTA, no wheezes. CV: RR nl S1, S2. Abd: soft, benign, no masses.  BS+ Ext: No edema. Pulses 2+    Planned procedures: Proceed with colonoscopy. The patient understands the nature of the planned procedure, indications, risks, alternatives and potential complications including but not limited to bleeding, infection, perforation, damage to internal organs and possible oversedation/side effects from anesthesia. The patient agrees and gives consent to proceed.  Please refer to procedure notes for findings, recommendations and patient disposition/instructions.     [name] Leslie Prince, M.D. Gastroenterology 09/28/2021  12:47 PM

## 2021-09-28 NOTE — Transfer of Care (Signed)
Immediate Anesthesia Transfer of Care Note  Patient: Leslie Prince  Procedure(s) Performed: COLONOSCOPY  Patient Location: PACU  Anesthesia Type:General  Level of Consciousness: sedated  Airway & Oxygen Therapy: Patient Spontanous Breathing and Patient connected to face mask oxygen  Post-op Assessment: Report given to RN and Post -op Vital signs reviewed and stable  Post vital signs: Reviewed and stable  Last Vitals:  Vitals Value Taken Time  BP 106/50 09/28/21 1457  Temp 36.4 C 09/28/21 1456  Pulse 69 09/28/21 1458  Resp 25 09/28/21 1458  SpO2 94 % 09/28/21 1458  Vitals shown include unvalidated device data.  Last Pain:  Vitals:   09/28/21 1456  TempSrc: Temporal  PainSc: 0-No pain         Complications: No notable events documented.

## 2021-09-28 NOTE — Anesthesia Preprocedure Evaluation (Signed)
Anesthesia Evaluation  Patient identified by MRN, date of birth, ID band Patient awake    Reviewed: Allergy & Precautions, H&P , NPO status , Patient's Chart, lab work & pertinent test results  History of Anesthesia Complications Negative for: history of anesthetic complications  Airway Mallampati: III  TM Distance: <3 FB Neck ROM: limited    Dental  (+) Chipped, Poor Dentition, Missing, Dental Advidsory Given   Pulmonary shortness of breath and with exertion, sleep apnea , COPD,  COPD inhaler, neg recent URI, former smoker,    Pulmonary exam normal        Cardiovascular Exercise Tolerance: Good hypertension, (-) angina+ CAD, + Cardiac Stents, + CABG, + Peripheral Vascular Disease and +CHF  (-) Past MI Normal cardiovascular exam+ Valvular Problems/Murmurs (s/p mitral valve repair)      Neuro/Psych PSYCHIATRIC DISORDERS Depression negative neurological ROS     GI/Hepatic negative GI ROS, (+) Hepatitis -  Endo/Other  negative endocrine ROS  Renal/GU      Musculoskeletal   Abdominal   Peds  Hematology negative hematology ROS (+)   Anesthesia Other Findings Patient has cardiac clearance for this procedure.   Past Medical History: No date: Atrial fibrillation (HCC) No date: Cancer [facility])     Comment:  prostate cancer No date: CHF (congestive heart failure) (HCC) No date: COPD (chronic obstructive pulmonary disease) (HCC) No date: Coronary artery disease No date: Depression No date: Hypertension No date: Sleep apnea  Past Surgical History: No date: CARDIAC SURGERY No date: CARDIAC VALVE REPLACEMENT 2013: CARDIOVERSION 08/06/2019: CATARACT EXTRACTION W/PHACO; Right     Comment:  Procedure: CATARACT EXTRACTION PHACO AND INTRAOCULAR               LENS PLACEMENT (IOC) RIGHT 12.34  01:25.4  14.5%;                Surgeon: Leandrew Koyanagi, MD;  Location: [place];  Service: Ophthalmology;   Laterality:               Right; 08/27/2019: CATARACT EXTRACTION W/PHACO; Left     Comment:  Procedure: CATARACT EXTRACTION PHACO AND INTRAOCULAR               LENS PLACEMENT (IOC) LEFT 7.92 01:16.9 10.3%;  Surgeon:               Leandrew Koyanagi, MD;  Location: [place];              Service: Ophthalmology;  Laterality: Left;  sleep apnea 11/07/2017: COLONOSCOPY WITH PROPOFOL; N/A     Comment:  Procedure: COLONOSCOPY WITH PROPOFOL;  Surgeon: Manya Silvas, MD;  Location[facility] ENDOSCOPY;  Service:               Endoscopy;  Laterality: N/A; 2011: CORONARY ANGIOPLASTY WITH STENT PLACEMENT No date: CORONARY ARTERY BYPASS GRAFT     Comment:  during mitral valve repair 1997: MITRAL VALVE REPAIR     Reproductive/Obstetrics negative OB ROS                             Anesthesia Physical  Anesthesia Plan  ASA: 4  Anesthesia Plan: General   Post-op Pain Management:    Induction: Intravenous  PONV Risk Score and Plan: Treatment may vary due to age  or medical condition, Propofol infusion and TIVA  Airway Management Planned: Natural Airway and Nasal Cannula  Additional Equipment:   Intra-op Plan:   Post-operative Plan:   Informed Consent: I have reviewed the patients History and Physical, chart, labs and discussed the procedure including the risks, benefits and alternatives for the proposed anesthesia with the patient or authorized representative who has indicated his/her understanding and acceptance.     Dental Advisory Given  Plan Discussed with: Anesthesiologist, CRNA and Surgeon  Anesthesia Plan Comments: (Patient consented for risks of anesthesia including but not limited to:  - adverse reactions to medications - damage to eyes, teeth, lips or other oral mucosa - nerve damage due to positioning  - sore throat or hoarseness - Damage to heart, brain, nerves, lungs, other parts of body or loss of life  Patient  voiced understanding.)        Anesthesia Quick Evaluation

## 2021-09-28 NOTE — Anesthesia Procedure Notes (Signed)
Date/Time: 09/28/2021 2:40 PM  Performed by: Nelda Marseille, CRNAPre-anesthesia Checklist: Patient identified, Emergency Drugs available, Suction available, Patient being monitored and Timeout performed Oxygen Delivery Method: Simple face mask

## 2021-09-28 NOTE — Interval H&P Note (Signed)
History and Physical Interval Note:  09/28/2021 2:00 PM  Leslie Prince  has presented today for surgery, with the diagnosis of Hx of adenomatous colonic polyps (Z86.010).  The various methods of treatment have been discussed with the patient and family. After consideration of risks, benefits and other options for treatment, the patient has consented to  Procedure(s) with comments: COLONOSCOPY (N/A) - Cannot move up as a surgical intervention.  The patient's history has been reviewed, patient examined, no change in status, stable for surgery.  I have reviewed the patient's chart and labs.  Questions were answered to the patient's satisfaction.     [place], [place]

## 2021-09-28 NOTE — Op Note (Signed)
[facility] Gastroenterology Patient Name: Leslie Prince Procedure Date: 09/28/2021 2:02 PM MRN: [id number] Account #: [id number] Date of Birth: 05-17-1945 Admit Type: Outpatient Age: 76 Room: [facility] ENDO ROOM 2 Gender: Male Note Status: Finalized Instrument Name: Park Meo [id number] Procedure:             Colonoscopy Indications:           Surveillance: Personal history of adenomatous polyps                         on last colonoscopy > 3 years ago Providers:             Lorie Apley K. Corneluis Allston MD, MD Medicines:             Propofol per Anesthesia Complications:         No immediate complications. Procedure:             Pre-Anesthesia Assessment:                        - The risks and benefits of the procedure and the                         sedation options and risks were discussed with the                         patient. All questions were answered and informed                         consent was obtained.                        - Patient identification and proposed procedure were                         verified prior to the procedure by the nurse. The                         procedure was verified in the procedure room.                        After obtaining informed consent, the colonoscope was                         passed under direct vision. Throughout the procedure,                         the patient's blood pressure, pulse, and oxygen                         saturations were monitored continuously. The                         Colonoscope was introduced through the anus and                         advanced to the the cecum, identified by appendiceal                         orifice and ileocecal valve. The colonoscopy was  performed without difficulty. The patient tolerated                         the procedure well. The quality of the bowel                         preparation was adequate. The ileocecal valve,                          appendiceal orifice, and rectum were photographed. Findings:      The perianal and digital rectal examinations were normal. Pertinent       negatives include normal sphincter tone and no palpable rectal lesions.      A single medium-mouthed diverticulum was found in the sigmoid colon.      A 5 mm polyp was found in the transverse colon. The polyp was sessile.       The polyp was removed with a jumbo cold forceps. Resection and retrieval       were complete.      The exam was otherwise without abnormality on direct and retroflexion       views. Impression:            - Diverticulosis in the sigmoid colon.                        - One 5 mm polyp in the transverse colon, removed with                         a jumbo cold forceps. Resected and retrieved.                        - The examination was otherwise normal on direct and                         retroflexion views. Recommendation:        - Patient has a contact number available for                         emergencies. The signs and symptoms of potential                         delayed complications were discussed with the patient.                         Return to normal activities tomorrow. Written                         discharge instructions were provided to the patient.                        - Resume previous diet.                        - Continue present medications.                        - If polyps are benign or adenomatous without  dysplasia, I will advise NO further colonoscopy due to                         advanced age and/or severe comorbidity.                        - Return to GI office PRN.                        - Resume Pradaxa (dabigatran) at prior dose tomorrow.                         Refer to managing physician for further adjustment of                         therapy.                        - Await pathology results.                        - The findings and recommendations were  discussed with                         the patient. Procedure Code(s):     --- Professional ---                        [phone], Colonoscopy, flexible; with biopsy, single or                         multiple Diagnosis Code(s):     --- Professional ---                        K57.30, Diverticulosis of large intestine without                         perforation or abscess without bleeding                        K63.5, Polyp of colon                        Z86.010, Personal history of colonic polyps CPT copyright 2019 American Medical Association. All rights reserved. The codes documented in this report are preliminary and upon coder review may  be revised to meet current compliance requirements. Efrain Sella MD, MD 09/28/2021 2:58:44 PM This report has been signed electronically. Number of Addenda: 0 Note Initiated On: 09/28/2021 2:02 PM Scope Withdrawal Time: 0 hours 6 minutes 4 seconds  Total Procedure Duration: 0 hours 13 minutes 19 seconds  Estimated Blood Loss:  Estimated blood loss: none.      [facility]

## 2021-09-30 ENCOUNTER — Encounter: Payer: Self-pay | Admitting: Internal Medicine

## 2021-09-30 LAB — SURGICAL PATHOLOGY

## 2021-10-06 IMAGING — CT CT CHEST W/O CM
2 of 4 series · 12 of 36 positions shown, 15 images · non-contrast
Comparison: Chest CTA 04/08/2019. CT the chest, abdomen scotch that
CT the abdomen and pelvis 03/26/2018.

CLINICAL DATA: 74-year-old male with history of chronic bronchitis
with shortness of breath. Prostate cancer.

EXAM:
CT CHEST, ABDOMEN AND PELVIS WITHOUT CONTRAST
TECHNIQUE: Multidetector CT imaging of the chest, abdomen and pelvis was
performed following the standard protocol without IV contrast.

[Series 2: axials cap 5.00 · axial · 0.96mm/px · z∈[-1540,-940]mm · 9 of 148 slices shown, 12 images]
[im 14/148  mediastinal]
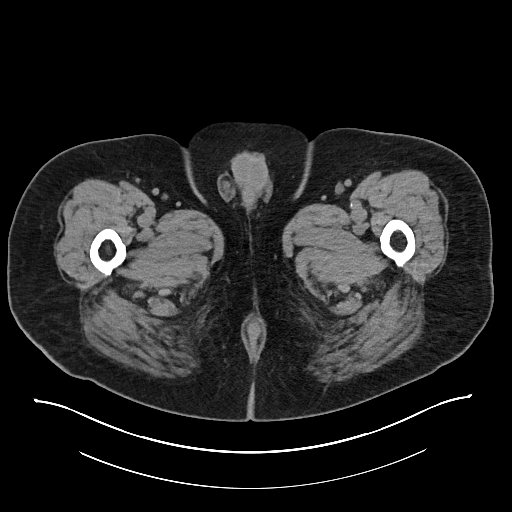
[im 14/148  lung]
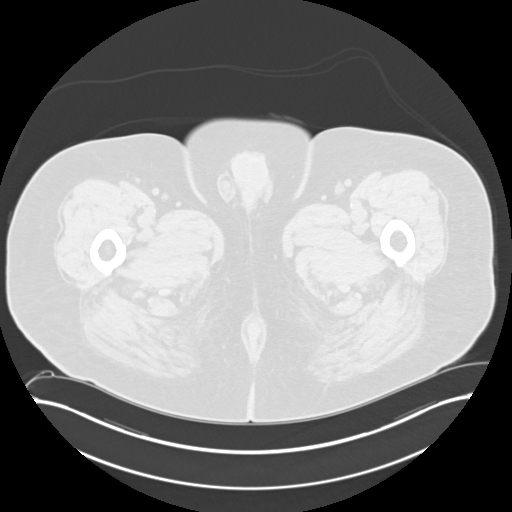
[im 27/148  lung]
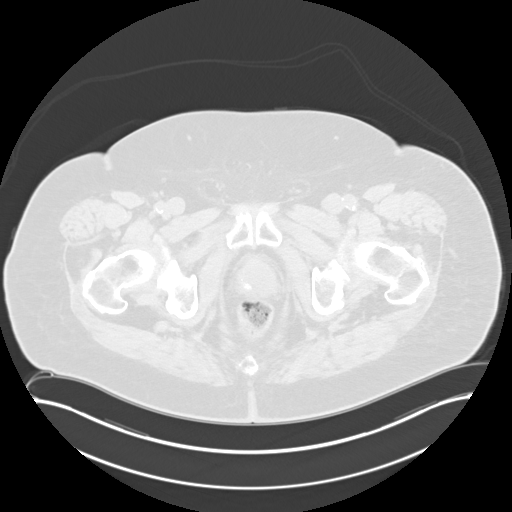
[im 41/148  lung]
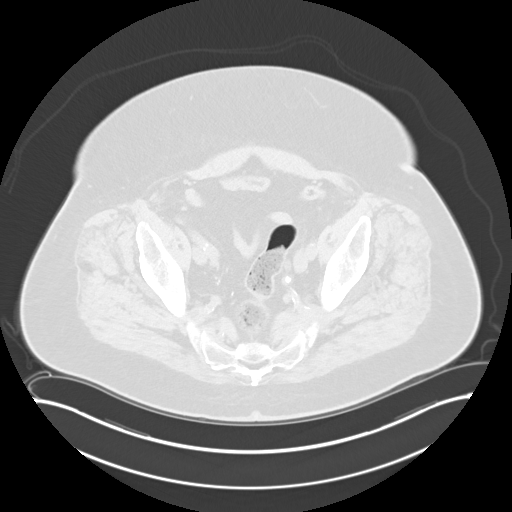
[im 54/148  lung]
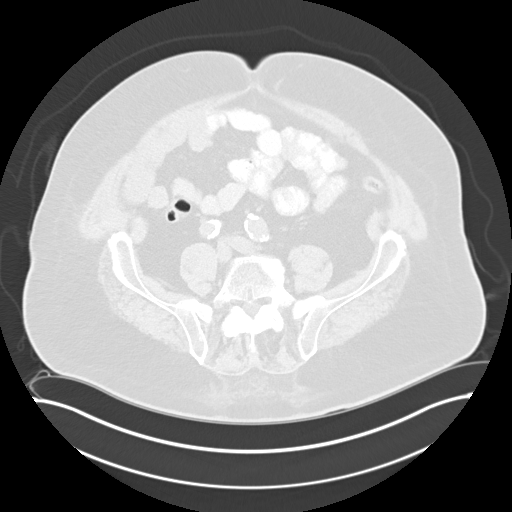
[im 81/148  mediastinal]
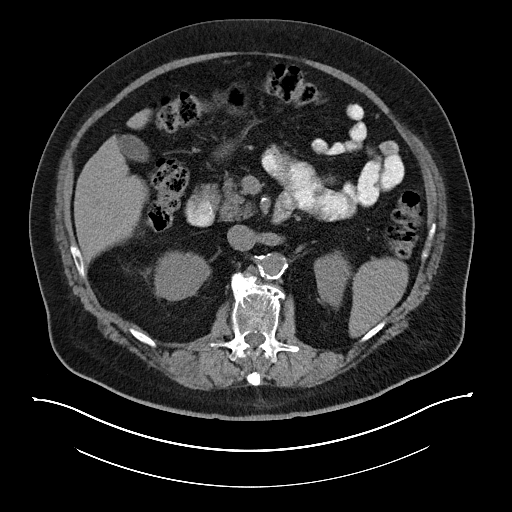
[im 81/148  lung]
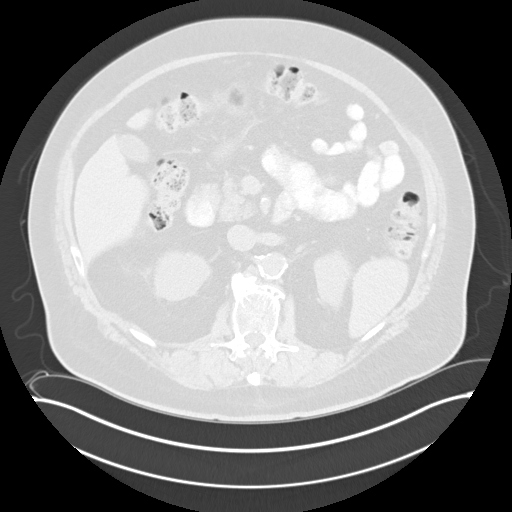
[im 94/148  lung]
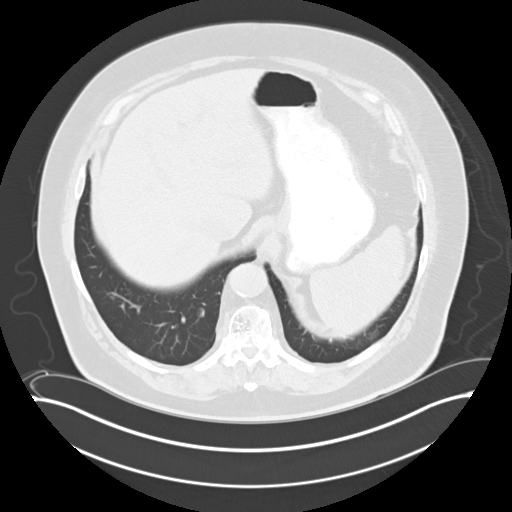
[im 107/148  lung]
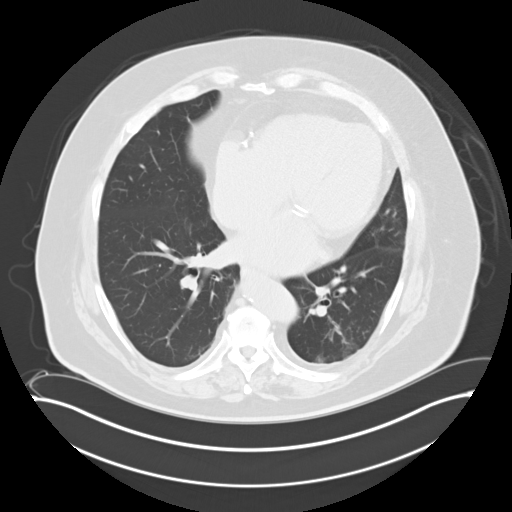
[im 121/148  lung]
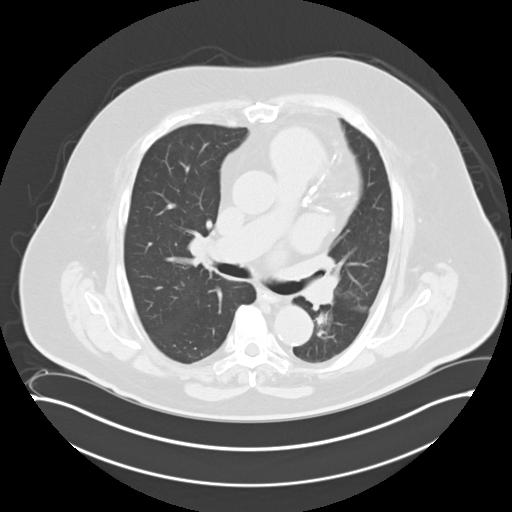
[im 134/148  mediastinal]
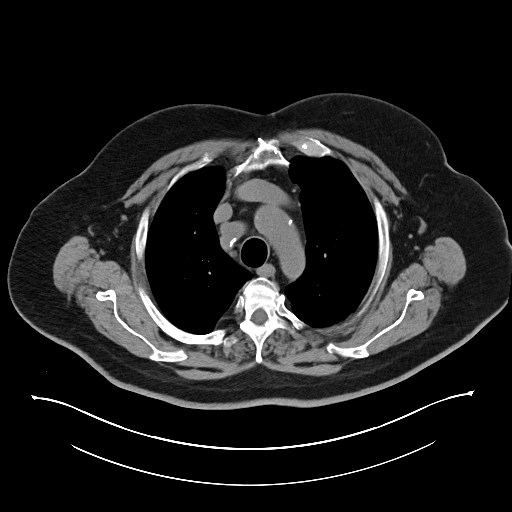
[im 134/148  lung]
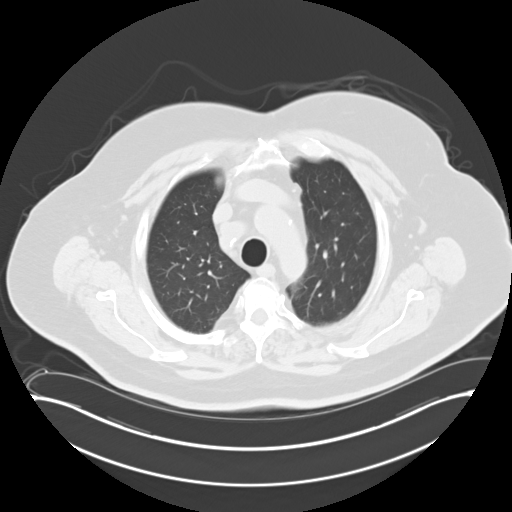

[Series 4: coronals cap 2.00 cor · coronal · 0.95mm/px · 3 of 195 slices shown]
[im 39/195  lung]
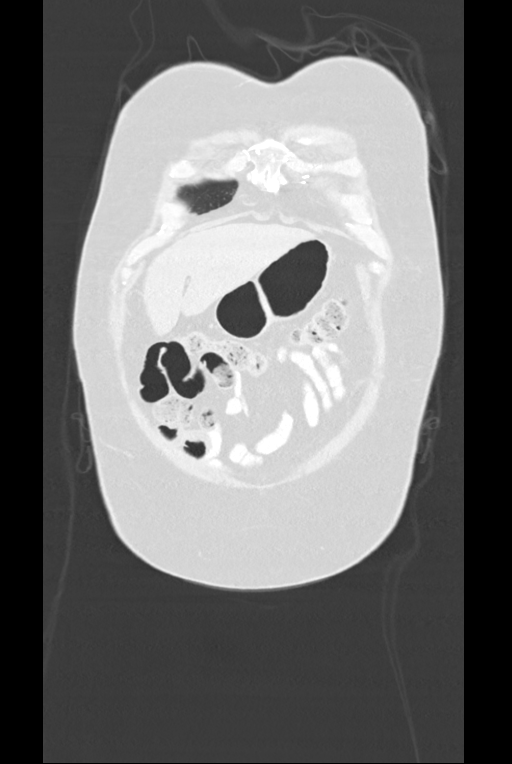
[im 78/195  lung]
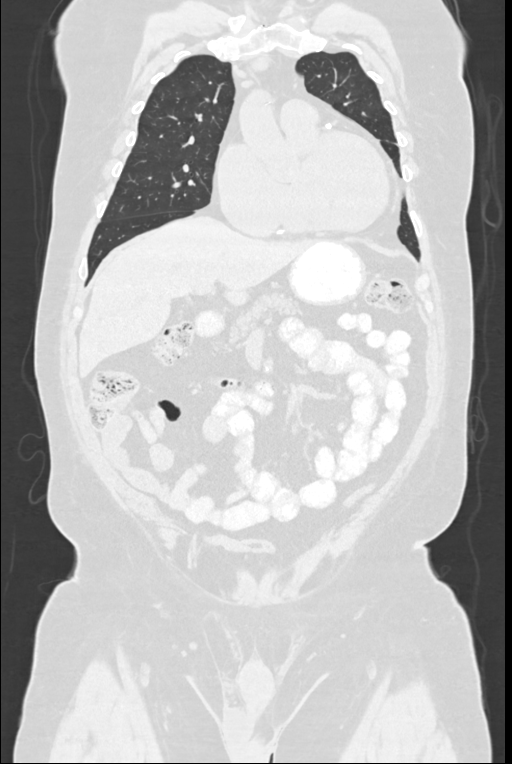
[im 117/195  lung]
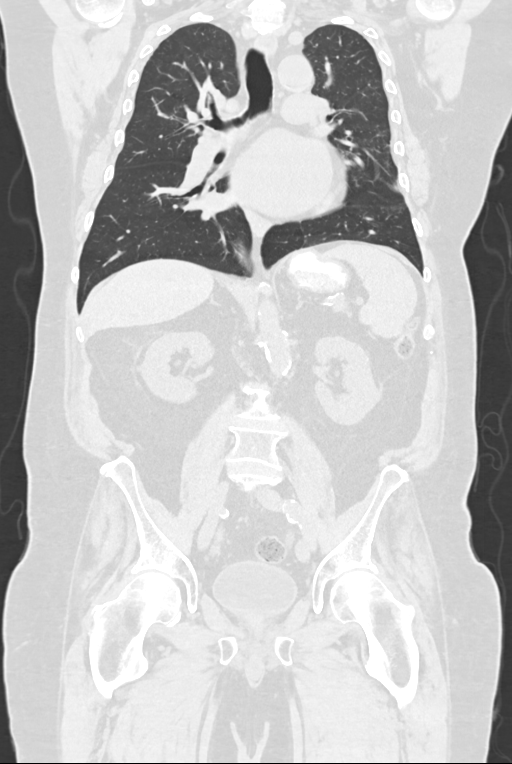

[12 of 36 positions shown; findings below may reference images not displayed]

FINDINGS: CT CHEST FINDINGS

Cardiovascular: Heart size is mildly enlarged. There is no
significant pericardial fluid, thickening or pericardial
calcification. There is aortic atherosclerosis, as well as
atherosclerosis of the great vessels of the mediastinum and the
coronary arteries, including calcified atherosclerotic plaque in the
left main, left anterior descending, left circumflex and right
coronary arteries. Status post median sternotomy for mitral
annuloplasty.

Mediastinum/Nodes: No pathologically enlarged mediastinal or hilar
lymph nodes. Please note that accurate exclusion of hilar adenopathy
is limited on noncontrast CT scans. Esophagus is unremarkable in
appearance. No axillary lymphadenopathy.

Lungs/Pleura: A few scattered tiny pulmonary nodules are noted in
the lungs bilaterally, largest of which measures 5 mm in the right
upper lobe (axial image 65 of series 3). Small amount of
peribronchovascular ground-glass attenuation in the left lower lobe
where there is also some micronodularity. No confluent consolidative
airspace disease. No pleural effusions.

Musculoskeletal: Median sternotomy. There are no aggressive
appearing lytic or blastic lesions noted in the visualized portions
of the skeleton.

CT ABDOMEN PELVIS FINDINGS

Hepatobiliary: No suspicious cystic or solid hepatic lesions are
confidently identified on today's noncontrast CT examination.
Unenhanced appearance of the gallbladder is normal.

Pancreas: No definite pancreatic or peripancreatic fluid collections
or inflammatory changes are noted on today's noncontrast CT
examination.

Spleen: Unremarkable.

Adrenals/Urinary Tract: Unenhanced appearance of the kidneys and
bilateral adrenal glands is normal. No hydroureteronephrosis.
Urinary bladder is normal in appearance.

Stomach/Bowel: Unenhanced appearance of the stomach is normal. No
pathologic dilatation of small bowel or colon.

Vascular/Lymphatic: Aortic atherosclerosis with fusiform aneurysmal
dilatation of the infrarenal abdominal aorta measuring up to 5 cm in
diameter. No lymphadenopathy noted in the abdomen or pelvis.

Reproductive: Prostate gland and seminal vesicles are unremarkable
in appearance.

Other: No significant volume of ascites.  No pneumoperitoneum.

Musculoskeletal: There are no aggressive appearing lytic or blastic
lesions noted in the visualized portions of the skeleton.
IMPRESSION: 1. A few scattered pulmonary nodules in the lungs bilaterally
measuring 5 mm or less in size are nonspecific, but new compared to
the prior study 04/08/2019. Close attention on follow-up imaging is
recommended to ensure the resolution of these findings, as the
possibility of metastatic disease is not excluded.
2. Very mild patchy peribronchovascular ground-glass attenuation
micronodularity in the left lower lobe which may represent
developing bronchopneumonia or sequela of mild aspiration.
3. No evidence of metastatic disease in the abdomen or pelvis.
4. Aortic atherosclerosis, in addition to left main and 3 vessel
coronary artery disease. Assessment for potential risk factor
modification, dietary therapy or pharmacologic therapy may be
warranted, if clinically indicated.
5. There is also fusiform aneurysmal dilatation of the infrarenal
abdominal aorta measuring up to 5 cm in diameter. Recommend followup
by abdomen and pelvis CTA in 6 months, and vascular surgery
referral/consultation if not already obtained. This recommendation
follows ACR consensus guidelines: White Paper of the ACR [employer]

## 2021-11-21 ENCOUNTER — Other Ambulatory Visit: Payer: Self-pay

## 2021-11-21 MED ORDER — ENALAPRIL MALEATE 20 MG PO TABS: 20.0000 mg | ORAL_TABLET | Freq: Two times a day (BID) | ORAL | 0 refills | Status: DC

## 2021-11-22 ENCOUNTER — Other Ambulatory Visit: Payer: Self-pay

## 2021-11-22 ENCOUNTER — Encounter: Payer: Self-pay | Admitting: Cardiology

## 2021-11-22 MED ORDER — KLOR-CON M10 10 MEQ PO TBCR: 20.0000 meq | EXTENDED_RELEASE_TABLET | Freq: Two times a day (BID) | ORAL | 3 refills | Status: DC

## 2021-12-02 ENCOUNTER — Other Ambulatory Visit: Payer: Self-pay | Admitting: Pulmonary Disease

## 2021-12-02 DIAGNOSIS — R911 Solitary pulmonary nodule: Secondary | ICD-10-CM

## 2021-12-14 ENCOUNTER — Other Ambulatory Visit: Payer: Self-pay | Admitting: Pulmonary Disease

## 2021-12-14 DIAGNOSIS — I714 Abdominal aortic aneurysm, without rupture, unspecified: Secondary | ICD-10-CM

## 2021-12-15 ENCOUNTER — Other Ambulatory Visit (INDEPENDENT_AMBULATORY_CARE_PROVIDER_SITE_OTHER): Payer: Self-pay | Admitting: Vascular Surgery

## 2021-12-15 DIAGNOSIS — I714 Abdominal aortic aneurysm, without rupture, unspecified: Secondary | ICD-10-CM

## 2021-12-18 NOTE — Progress Notes (Unsigned)
MRN : [id number]  Leslie Prince is a 76 y.o. (06-26-1945) male who presents with chief complaint of check circulation.  History of Present Illness:   The patient returns to the office for surveillance of an abdominal aortic aneurysm status post stent graft placement on 11/26/2019.    Patient denies abdominal pain or back pain, no other abdominal complaints. No groin related complaints. No symptoms consistent with distal embolization No changes in claudication distance.   He is also concerned about increasing edema of the left ankle.  This is the leg that the GSV was harvested from for GABG.   There have been no interval changes in his overall healthcare since his last visit.    Patient denies amaurosis fugax or TIA symptoms. There is no history of claudication or rest pain symptoms of the lower extremities. The patient denies angina or shortness of breath.    Duplex US of the aorta and iliac arteries shows a 5.31 AAA sac with type II endoleak, previous sac measurement was 5.37 cm.  No outpatient medications have been marked as taking for the 12/19/21 encounter (Appointment) with Delana Meyer, Dolores Lory, MD.    Past Medical History:  Diagnosis Date   Atrial fibrillation ([place])    Cancer [facility])    prostate cancer   CHF (congestive heart failure) ([place])    COPD (chronic obstructive pulmonary disease) ([place])    Coronary artery disease    Depression    Hepatitis A infection    History of colon polyps    Hyperlipidemia    Hypertension    Mitral valve problem    Sleep apnea     Past Surgical History:  Procedure Laterality Date   CARDIAC SURGERY     CARDIAC VALVE REPLACEMENT     CARDIOVERSION  2013   CATARACT EXTRACTION W/PHACO Right 08/06/2019   Procedure: CATARACT EXTRACTION PHACO AND INTRAOCULAR LENS PLACEMENT (IOC) RIGHT 12.34  01:25.4  14.5%;  Surgeon: Leandrew Koyanagi, MD;  Location: [place];  Service: Ophthalmology;  Laterality: Right;   CATARACT  EXTRACTION W/PHACO Left 08/27/2019   Procedure: CATARACT EXTRACTION PHACO AND INTRAOCULAR LENS PLACEMENT (IOC) LEFT 7.92 01:16.9 10.3%;  Surgeon: Leandrew Koyanagi, MD;  Location: [place];  Service: Ophthalmology;  Laterality: Left;  sleep apnea   COLONOSCOPY N/A 09/28/2021   Procedure: COLONOSCOPY;  Surgeon: Toledo, Benay Pike, MD;  Location: [facility] ENDOSCOPY;  Service: Gastroenterology;  Laterality: N/A;  Cannot move up   COLONOSCOPY WITH PROPOFOL N/A 11/07/2017   Procedure: COLONOSCOPY WITH PROPOFOL;  Surgeon: Manya Silvas, MD;  Location: [facility] ENDOSCOPY;  Service: Endoscopy;  Laterality: N/A;   CORONARY ANGIOPLASTY WITH STENT PLACEMENT  2011   CORONARY ARTERY BYPASS GRAFT     during mitral valve repair   ENDOVASCULAR REPAIR/STENT GRAFT N/A 11/26/2019   Procedure: ENDOVASCULAR REPAIR/STENT GRAFT;  Surgeon: Katha Cabal, MD;  Location: [place] CV LAB;  Service: Cardiovascular;  Laterality: N/A;   EYE SURGERY     MITRAL VALVE REPAIR  1997    Social History Social History   Tobacco Use   Smoking status: Former    Packs/day: 1.00    Years: 25.00    Total pack years: 25.00    Types: Cigarettes    Quit date: 06/21/1995    Years since quitting: 26.5   Smokeless tobacco: Never  Vaping Use   Vaping Use: Never used  Substance Use Topics  Alcohol use: Not Currently    Comment: Rare   Drug use: Never    Family History Family History  Problem Relation Age of Onset   Hypertension Mother    Valvular heart disease Father     Allergies  Allergen Reactions   Lisinopril Other (See Comments)    Severe urination Other reaction(s): Other (See Comments), Other (See Comments) Severe urination      REVIEW OF SYSTEMS (Negative unless checked)  Constitutional: '[]'$ Weight loss  '[]'$ Fever  '[]'$ Chills Cardiac: '[]'$ Chest pain   '[]'$ Chest pressure   '[]'$ Palpitations   '[]'$ Shortness of breath when laying flat   '[]'$ Shortness of breath with exertion. Vascular:  '[x]'$ Pain in legs  with walking   '[]'$ Pain in legs at rest  '[]'$ History of DVT   '[]'$ Phlebitis   '[]'$ Swelling in legs   '[]'$ Varicose veins   '[]'$ Non-healing ulcers Pulmonary:   '[]'$ Uses home oxygen   '[]'$ Productive cough   '[]'$ Hemoptysis   '[]'$ Wheeze  '[]'$ COPD   '[]'$ Asthma Neurologic:  '[]'$ Dizziness   '[]'$ Seizures   '[]'$ History of stroke   '[]'$ History of TIA  '[]'$ Aphasia   '[]'$ Vissual changes   '[]'$ Weakness or numbness in arm   '[]'$ Weakness or numbness in leg Musculoskeletal:   '[]'$ Joint swelling   '[]'$ Joint pain   '[]'$ Low back pain Hematologic:  '[]'$ Easy bruising  '[]'$ Easy bleeding   '[]'$ Hypercoagulable state   '[]'$ Anemic Gastrointestinal:  '[]'$ Diarrhea   '[]'$ Vomiting  '[]'$ Gastroesophageal reflux/heartburn   '[]'$ Difficulty swallowing. Genitourinary:  '[]'$ Chronic kidney disease   '[]'$ Difficult urination  '[]'$ Frequent urination   '[]'$ Blood in urine Skin:  '[]'$ Rashes   '[]'$ Ulcers  Psychological:  '[]'$ History of anxiety   '[]'$  History of major depression.  Physical Examination  There were no vitals filed for this visit. There is no height or weight on file to calculate BMI. Gen: WD/WN, NAD Head: [place]/AT, No temporalis wasting.  Ear/Nose/Throat: Hearing grossly intact, nares w/o erythema or drainage Eyes: PER, EOMI, sclera nonicteric.  Neck: Supple, no masses.  No bruit or JVD.  Pulmonary:  Good air movement, no audible wheezing, no use of accessory muscles.  Cardiac: RRR, normal S1, S2, no Murmurs. Vascular:  mild trophic changes, no open wounds Vessel Right Left  Radial Palpable Palpable  Gastrointestinal: soft, non-distended. No guarding/no peritoneal signs.  Musculoskeletal: M/S 5/5 throughout.  No visible deformity.  Neurologic: CN 2-12 intact. Pain and light touch intact in extremities.  Symmetrical.  Speech is fluent. Motor exam as listed above. Psychiatric: Judgment intact, Mood & affect appropriate for pt's clinical situation. Dermatologic: No rashes or ulcers noted.  No changes consistent with cellulitis.   CBC Lab Results  Component Value Date   WBC 5.6 08/17/2021   HGB  11.5 (L) 08/17/2021   HCT 35.0 (L) 08/17/2021   MCV 93.1 08/17/2021   PLT 140 (L) 08/17/2021    BMET    Component Value Date/Time   NA 137 08/17/2021 1229   K 4.0 08/17/2021 1229   CL 105 08/17/2021 1229   CO2 25 08/17/2021 1229   GLUCOSE 94 08/17/2021 1229   BUN 18 08/17/2021 1229   CREATININE 0.92 08/17/2021 1229   CALCIUM 9.3 08/17/2021 1229   GFRNONAA >60 08/17/2021 1229   GFRAA >60 11/27/2019 0545   CrCl cannot be calculated (Patient's most recent lab result is older than the maximum 21 days allowed.).  COAG Lab Results  Component Value Date   INR 2.0 (H) 11/19/2019   INR 1.7 (H) 11/15/2018    Radiology No results found.   Assessment/Plan 1. Infrarenal abdominal aortic aneurysm (AAA) without  rupture [facility] Recommend:  Patient is status post successful endovascular repair of the AAA.  There is a type II endoleak noted which is stable compared to last visit.   No further intervention is required at this time.   No endoleak is detected and the aneurysm sac is stable.  The patient will continue antiplatelet therapy as prescribed as well as aggressive management of hyperlipidemia. Exercise is again strongly encouraged.   However, endografts require continued surveillance with ultrasound or CT scan. This is mandatory to detect any changes that allow repressurization of the aneurysm sac.  The patient is informed that this would be asymptomatic.  The patient is reminded that lifelong routine surveillance is a necessity with an endograft. Patient will continue to follow-up at the specified interval with ultrasound of the aorta.   - VAS US AORTA/IVC/ILIACS; Future  2. PAD (peripheral artery disease) (HCC)  Recommend:  The patient has evidence of atherosclerosis of the lower extremities with claudication.  The patient does not voice lifestyle limiting changes at this point in time.  Noninvasive studies do not suggest clinically significant change.  No invasive  studies, angiography or surgery at this time The patient should continue walking and begin a more formal exercise program.  The patient should continue antiplatelet therapy and aggressive treatment of the lipid abnormalities  No changes in the patient's medications at this time  Continued surveillance is indicated as atherosclerosis is likely to progress with time.    The patient will continue follow up with noninvasive studies as ordered.    3. Lymphedema Recommend:  No surgery or intervention at this point in time.  I have reviewed my discussion with the patient regarding venous insufficiency and why it causes symptoms. I have discussed with the patient the chronic skin changes that accompany venous insufficiency and the long term sequela such as ulceration. Patient will contnue wearing graduated compression stockings on a daily basis, as this has provided excellent control of his edema. The patient will put the stockings on first thing in the morning and removing them in the evening. The patient is reminded not to sleep in the stockings.  In addition, behavioral modification including elevation during the day will be initiated. Exercise is strongly encouraged.  Previous duplex ultrasound of the lower extremities shows normal deep system, no significant superficial reflux was identified.  Given the patient's good control and lack of any problems regarding the venous insufficiency and lymphedema a lymph pump in not need at this time.    4. Arteriosclerosis of coronary artery Continue cardiac and antihypertensive medications as already ordered and reviewed, no changes at this time.  Continue statin as ordered and reviewed, no changes at this time  Nitrates PRN for chest pain   5. Permanent atrial fibrillation (HCC) Continue antiarrhythmia medications as already ordered, these medications have been reviewed and there are no changes at this time.  Continue anticoagulation as ordered  by Cardiology Service   6. Primary hypertension Continue antihypertensive medications as already ordered, these medications have been reviewed and there are no changes at this time.   7. Abdominal aortic aneurysm (AAA) without rupture, unspecified part ([place]) Recommend:  Patient is status post successful endovascular repair of the AAA.  There is a type II endoleak noted which is stable compared to last visit.   No further intervention is required at this time.   No endoleak is detected and the aneurysm sac is stable.  The patient will continue antiplatelet therapy as prescribed as well as  aggressive management of hyperlipidemia. Exercise is again strongly encouraged.   However, endografts require continued surveillance with ultrasound or CT scan. This is mandatory to detect any changes that allow repressurization of the aneurysm sac.  The patient is informed that this would be asymptomatic.  The patient is reminded that lifelong routine surveillance is a necessity with an endograft. Patient will continue to follow-up at the specified interval with ultrasound of the aorta.   - VAS [place], MD  12/18/2021 8:03 PM

## 2021-12-19 ENCOUNTER — Ambulatory Visit (INDEPENDENT_AMBULATORY_CARE_PROVIDER_SITE_OTHER): Payer: Medicare PPO | Admitting: Vascular Surgery

## 2021-12-19 ENCOUNTER — Ambulatory Visit (INDEPENDENT_AMBULATORY_CARE_PROVIDER_SITE_OTHER): Payer: Medicare PPO

## 2021-12-19 ENCOUNTER — Encounter (INDEPENDENT_AMBULATORY_CARE_PROVIDER_SITE_OTHER): Payer: Self-pay | Admitting: Vascular Surgery

## 2021-12-19 VITALS — BP 131/65 | HR 61 | Resp 16 | Wt 281.6 lb

## 2021-12-19 DIAGNOSIS — I739 Peripheral vascular disease, unspecified: Secondary | ICD-10-CM

## 2021-12-19 DIAGNOSIS — I10 Essential (primary) hypertension: Secondary | ICD-10-CM

## 2021-12-19 DIAGNOSIS — I714 Abdominal aortic aneurysm, without rupture, unspecified: Secondary | ICD-10-CM

## 2021-12-19 DIAGNOSIS — I2510 Atherosclerotic heart disease of native coronary artery without angina pectoris: Secondary | ICD-10-CM | POA: Diagnosis not present

## 2021-12-19 DIAGNOSIS — I890 Lymphedema, not elsewhere classified: Secondary | ICD-10-CM

## 2021-12-19 DIAGNOSIS — I7143 Infrarenal abdominal aortic aneurysm, without rupture: Secondary | ICD-10-CM

## 2021-12-19 DIAGNOSIS — I4821 Permanent atrial fibrillation: Secondary | ICD-10-CM

## 2021-12-26 ENCOUNTER — Other Ambulatory Visit: Payer: Self-pay | Admitting: Pulmonary Disease

## 2021-12-26 ENCOUNTER — Encounter: Payer: Self-pay | Admitting: Cardiology

## 2021-12-26 DIAGNOSIS — J42 Unspecified chronic bronchitis: Secondary | ICD-10-CM

## 2021-12-26 DIAGNOSIS — R911 Solitary pulmonary nodule: Secondary | ICD-10-CM

## 2021-12-27 ENCOUNTER — Ambulatory Visit
Admission: RE | Admit: 2021-12-27 | Discharge: 2021-12-27 | Disposition: A | Discharge status: Home / Self Care | Payer: Medicare PPO | Source: Ambulatory Visit | Attending: Pulmonary Disease | Admitting: Pulmonary Disease

## 2021-12-27 ENCOUNTER — Other Ambulatory Visit: Payer: Self-pay

## 2021-12-27 DIAGNOSIS — J42 Unspecified chronic bronchitis: Secondary | ICD-10-CM

## 2021-12-27 DIAGNOSIS — I714 Abdominal aortic aneurysm, without rupture, unspecified: Secondary | ICD-10-CM

## 2021-12-27 DIAGNOSIS — R911 Solitary pulmonary nodule: Secondary | ICD-10-CM

## 2021-12-27 DIAGNOSIS — I4821 Permanent atrial fibrillation: Secondary | ICD-10-CM

## 2021-12-27 MED ORDER — DABIGATRAN ETEXILATE MESYLATE 150 MG PO CAPS: 150.0000 mg | ORAL_CAPSULE | Freq: Two times a day (BID) | ORAL | 0 refills | Status: DC

## 2021-12-27 MED ORDER — IOPAMIDOL (ISOVUE-300) INJECTION 61%
100.0000 mL | Freq: Once | INTRAVENOUS | Status: AC | PRN
Start: 2021-12-27 — End: 2021-12-27
  Administered 2021-12-27: 100 mL via INTRAVENOUS

## 2021-12-28 ENCOUNTER — Other Ambulatory Visit: Payer: Self-pay

## 2021-12-28 DIAGNOSIS — I4821 Permanent atrial fibrillation: Secondary | ICD-10-CM

## 2021-12-28 MED ORDER — DABIGATRAN ETEXILATE MESYLATE 150 MG PO CAPS: 150.0000 mg | ORAL_CAPSULE | Freq: Two times a day (BID) | ORAL | 1 refills | Status: DC

## 2021-12-29 ENCOUNTER — Encounter (INDEPENDENT_AMBULATORY_CARE_PROVIDER_SITE_OTHER): Payer: Self-pay | Admitting: Vascular Surgery

## 2021-12-29 ENCOUNTER — Encounter (INDEPENDENT_AMBULATORY_CARE_PROVIDER_SITE_OTHER): Payer: Medicare PPO | Admitting: Cardiology

## 2021-12-29 DIAGNOSIS — I77810 Thoracic aortic ectasia: Secondary | ICD-10-CM | POA: Diagnosis not present

## 2021-12-30 NOTE — Telephone Encounter (Signed)
He has a known leak but estimated to be slightly larger than known previously.  Did you want any additional imaging or continue follow-up?

## 2022-01-02 ENCOUNTER — Other Ambulatory Visit: Payer: Medicare PPO

## 2022-01-02 NOTE — Telephone Encounter (Signed)
Please see the MyChart message reply(ies) for my assessment and plan.    This patient gave consent for this Medical Advice Message and is aware that it may result in a bill to their insurance company, as well as the possibility of receiving a bill for a co-payment or deductible. They are an established patient, but are not seeking medical advice exclusively about a problem treated during an in person or video visit in the last seven days. I did not recommend an in person or video visit within seven days of my reply.    I spent a total of 13 minutes cumulative time within 7 days through MyChart messaging.  Mara Favero Agbor-Etang, MD   

## 2022-01-12 ENCOUNTER — Ambulatory Visit (INDEPENDENT_AMBULATORY_CARE_PROVIDER_SITE_OTHER): Payer: Medicare PPO | Admitting: Vascular Surgery

## 2022-01-16 ENCOUNTER — Ambulatory Visit (INDEPENDENT_AMBULATORY_CARE_PROVIDER_SITE_OTHER): Payer: Medicare PPO | Admitting: Vascular Surgery

## 2022-01-16 ENCOUNTER — Encounter (INDEPENDENT_AMBULATORY_CARE_PROVIDER_SITE_OTHER): Payer: Self-pay | Admitting: Vascular Surgery

## 2022-01-16 VITALS — BP 125/65 | HR 80 | Resp 16 | Wt 282.6 lb

## 2022-01-16 DIAGNOSIS — I2510 Atherosclerotic heart disease of native coronary artery without angina pectoris: Secondary | ICD-10-CM

## 2022-01-16 DIAGNOSIS — I7143 Infrarenal abdominal aortic aneurysm, without rupture: Secondary | ICD-10-CM | POA: Diagnosis not present

## 2022-01-16 DIAGNOSIS — J439 Emphysema, unspecified: Secondary | ICD-10-CM

## 2022-01-16 DIAGNOSIS — I10 Essential (primary) hypertension: Secondary | ICD-10-CM

## 2022-01-16 DIAGNOSIS — I7121 Aneurysm of the ascending aorta, without rupture: Secondary | ICD-10-CM | POA: Diagnosis not present

## 2022-01-16 DIAGNOSIS — I4821 Permanent atrial fibrillation: Secondary | ICD-10-CM

## 2022-01-21 ENCOUNTER — Encounter (INDEPENDENT_AMBULATORY_CARE_PROVIDER_SITE_OTHER): Payer: Self-pay | Admitting: Vascular Surgery

## 2022-01-21 DIAGNOSIS — I7121 Aneurysm of the ascending aorta, without rupture: Secondary | ICD-10-CM | POA: Insufficient documentation

## 2022-01-21 NOTE — Progress Notes (Signed)
MRN : [id number]  Leslie Prince is a 76 y.o. (Jan 16, 1946) male who presents with chief complaint of check circulation.  History of Present Illness:   The patient is seen for follow up evaluation of AAA status post CTA. There were no problems or complications related to the CT scan. The patient denies interval development of abdominal or back pain. No new lower extremity pain or discoloration of the toes.   The patient denies recent episodes of angina or shortness of breath. The patient denies interval anaurosis fugax. There is no recent history of TIA symptoms or focal motor deficits. The patient denies PAD or claudication symptoms.   CT angiography of the abdomen and pelvis shows an infrarenal AAA 5.1 cm.  This agrees with the duplex.  Also noted is an ascending thoracic aortic aneurysm of 4.1 cm  Current Meds  Medication Sig   albuterol (PROVENTIL) (2.5 MG/3ML) 0.083% nebulizer solution Take 2.5 mg by nebulization every 6 (six) hours as needed for wheezing.   albuterol (VENTOLIN HFA) 108 (90 Base) MCG/ACT inhaler Inhale 2 puffs into the lungs every 6 (six) hours as needed for wheezing.    allopurinol (ZYLOPRIM) 100 MG tablet Take 100 mg by mouth daily.   atorvastatin (LIPITOR) 20 MG tablet Take 20 mg by mouth at bedtime.    calcium carbonate (TUMS - DOSED IN MG ELEMENTAL CALCIUM) 500 MG chewable tablet Chew 1 tablet by mouth 2 (two) times daily.   colchicine 0.6 MG tablet Take 0.6 mg by mouth as directed. Take 2 tablets at onset of gout flare, then 1 tablet one hour later   dabigatran (PRADAXA) 150 MG CAPS capsule Take 1 capsule (150 mg total) by mouth 2 (two) times daily.   enalapril (VASOTEC) 20 MG tablet Take 1 tablet (20 mg total) by mouth 2 (two) times daily.   fluticasone (FLONASE) 50 MCG/ACT nasal spray Place into both nostrils daily.   Fluticasone-Salmeterol (ADVAIR) 250-50 MCG/DOSE AEPB Inhale 1 puff into the lungs 2 (two) times daily.    furosemide (LASIX)  40 MG tablet Take 40 mg by mouth daily.    KLOR-CON M10 10 MEQ tablet Take 2 tablets (20 mEq total) by mouth 2 (two) times daily.   metoprolol succinate (TOPROL-XL) 100 MG 24 hr tablet Take 100 mg by mouth daily.   Misc Natural Products (TART CHERRY ADVANCED PO) Take by mouth daily.    montelukast (SINGULAIR) 10 MG tablet Take 10 mg by mouth at bedtime.    Multiple Vitamins tablet Take 1 tablet by mouth daily.    sildenafil (VIAGRA) 100 MG tablet Take 1 tablet (100 mg total) by mouth daily as needed for erectile dysfunction.   tamsulosin (FLOMAX) 0.4 MG CAPS capsule TAKE 1 CAPSULE (0.4 MG TOTAL) BY MOUTH DAILY AFTER SUPPER.   tiotropium (SPIRIVA) 18 MCG inhalation capsule Place 18 mcg into inhaler and inhale daily.    Past Medical History:  Diagnosis Date   Atrial fibrillation ([place])    Cancer [facility])    prostate cancer   CHF (congestive heart failure) (HCC)    COPD (chronic obstructive pulmonary disease) (HCC)    Coronary artery disease    Depression    Hepatitis A infection    History of colon polyps    Hyperlipidemia    Hypertension    Mitral valve problem    Sleep apnea     Past Surgical History:  Procedure Laterality Date  CARDIAC SURGERY     CARDIAC VALVE REPLACEMENT     CARDIOVERSION  2013   CATARACT EXTRACTION W/PHACO Right 08/06/2019   Procedure: CATARACT EXTRACTION PHACO AND INTRAOCULAR LENS PLACEMENT (IOC) RIGHT 12.34  01:25.4  14.5%;  Surgeon: Leandrew Koyanagi, MD;  Location: [place];  Service: Ophthalmology;  Laterality: Right;   CATARACT EXTRACTION W/PHACO Left 08/27/2019   Procedure: CATARACT EXTRACTION PHACO AND INTRAOCULAR LENS PLACEMENT (IOC) LEFT 7.92 01:16.9 10.3%;  Surgeon: Leandrew Koyanagi, MD;  Location: [place];  Service: Ophthalmology;  Laterality: Left;  sleep apnea   COLONOSCOPY N/A 09/28/2021   Procedure: COLONOSCOPY;  Surgeon: Toledo, Benay Pike, MD;  Location: [facility] ENDOSCOPY;  Service: Gastroenterology;  Laterality: N/A;   Cannot move up   COLONOSCOPY WITH PROPOFOL N/A 11/07/2017   Procedure: COLONOSCOPY WITH PROPOFOL;  Surgeon: Manya Silvas, MD;  Location: [facility] ENDOSCOPY;  Service: Endoscopy;  Laterality: N/A;   CORONARY ANGIOPLASTY WITH STENT PLACEMENT  2011   CORONARY ARTERY BYPASS GRAFT     during mitral valve repair   ENDOVASCULAR REPAIR/STENT GRAFT N/A 11/26/2019   Procedure: ENDOVASCULAR REPAIR/STENT GRAFT;  Surgeon: Katha Cabal, MD;  Location: [place] [facility];  Service: Cardiovascular;  Laterality: N/A;   EYE SURGERY     MITRAL VALVE REPAIR  1997    Social History Social History   Tobacco Use   Smoking status: Former    Packs/day: 1.00    Years: 25.00    Total pack years: 25.00    Types: Cigarettes    Quit date: 06/21/1995    Years since quitting: 26.6   Smokeless tobacco: Never  Vaping Use   Vaping Use: Never used  Substance Use Topics   Alcohol use: Not Currently    Comment: Rare   Drug use: Never    Family History Family History  Problem Relation Age of Onset   Hypertension Mother    Valvular heart disease Father     Allergies  Allergen Reactions   Lisinopril Other (See Comments)    Severe urination Other reaction(s): Other (See Comments), Other (See Comments) Severe urination      REVIEW OF SYSTEMS (Negative unless checked)  Constitutional: '[]'$ Weight loss  '[]'$ Fever  '[]'$ Chills Cardiac: '[]'$ Chest pain   '[]'$ Chest pressure   '[]'$ Palpitations   '[]'$ Shortness of breath when laying flat   '[]'$ Shortness of breath with exertion. Vascular:  '[x]'$ Pain in legs with walking   '[]'$ Pain in legs at rest  '[]'$ History of DVT   '[]'$ Phlebitis   '[]'$ Swelling in legs   '[]'$ Varicose veins   '[]'$ Non-healing ulcers Pulmonary:   '[]'$ Uses home oxygen   '[]'$ Productive cough   '[]'$ Hemoptysis   '[]'$ Wheeze  '[]'$ COPD   '[]'$ Asthma Neurologic:  '[]'$ Dizziness   '[]'$ Seizures   '[]'$ History of stroke   '[]'$ History of TIA  '[]'$ Aphasia   '[]'$ Vissual changes   '[]'$ Weakness or numbness in arm   '[]'$ Weakness or numbness in leg Musculoskeletal:    '[]'$ Joint swelling   '[]'$ Joint pain   '[]'$ Low back pain Hematologic:  '[]'$ Easy bruising  '[]'$ Easy bleeding   '[]'$ Hypercoagulable state   '[]'$ Anemic Gastrointestinal:  '[]'$ Diarrhea   '[]'$ Vomiting  '[]'$ Gastroesophageal reflux/heartburn   '[]'$ Difficulty swallowing. Genitourinary:  '[]'$ Chronic kidney disease   '[]'$ Difficult urination  '[]'$ Frequent urination   '[]'$ Blood in urine Skin:  '[]'$ Rashes   '[]'$ Ulcers  Psychological:  '[]'$ History of anxiety   '[]'$  History of major depression.  Physical Examination  Vitals:   01/16/22 1409  BP: 125/65  Pulse: 80  Resp: 16  Weight: 282 lb 9.6 oz (128.2 kg)  Body mass index is 41.73 kg/m. Gen: WD/WN, NAD Head: [place]/AT, No temporalis wasting.  Ear/Nose/Throat: Hearing grossly intact, nares w/o erythema or drainage Eyes: PER, EOMI, sclera nonicteric.  Neck: Supple, no masses.  No bruit or JVD.  Pulmonary:  Good air movement, no audible wheezing, no use of accessory muscles.  Cardiac: RRR, normal S1, S2, no Murmurs. Vascular:  mild trophic changes, no open wounds Vessel Right Left  Radial Palpable Palpable  PT Not Palpable Not Palpable  DP Not Palpable Not Palpable  Gastrointestinal: soft, non-distended. No guarding/no peritoneal signs.  Musculoskeletal: M/S 5/5 throughout.  No visible deformity.  Neurologic: CN 2-12 intact. Pain and light touch intact in extremities.  Symmetrical.  Speech is fluent. Motor exam as listed above. Psychiatric: Judgment intact, Mood & affect appropriate for pt's clinical situation. Dermatologic: No rashes or ulcers noted.  No changes consistent with cellulitis.   CBC Lab Results  Component Value Date   WBC 5.6 08/17/2021   HGB 11.5 (L) 08/17/2021   HCT 35.0 (L) 08/17/2021   MCV 93.1 08/17/2021   PLT 140 (L) 08/17/2021    BMET    Component Value Date/Time   NA 137 08/17/2021 1229   K 4.0 08/17/2021 1229   CL 105 08/17/2021 1229   CO2 25 08/17/2021 1229   GLUCOSE 94 08/17/2021 1229   BUN 18 08/17/2021 1229   CREATININE 0.92 08/17/2021 1229    CALCIUM 9.3 08/17/2021 1229   GFRNONAA >60 08/17/2021 1229   GFRAA >60 11/27/2019 0545   CrCl cannot be calculated (Patient's most recent lab result is older than the maximum 21 days allowed.).  COAG Lab Results  Component Value Date   INR 2.0 (H) 11/19/2019   INR 1.7 (H) 11/15/2018    Radiology CT ABDOMEN PELVIS W CONTRAST  Result Date: 12/29/2021 CLINICAL DATA:  Chronic cough. COPD. History of pulmonary nodules. CABG. Aortic aneurysm repair. History of prostate cancer treated with radiation therapy. * Tracking Code: BO * EXAM: CT CHEST, ABDOMEN, AND PELVIS WITH CONTRAST TECHNIQUE: Multidetector CT imaging of the chest was performed following the standard protocol without IV contrast. Multidetector CT imaging of the abdomen and pelvis was performed following the standard protocol during bolus administration of intravenous contrast. RADIATION DOSE REDUCTION: This exam was performed according to the departmental dose-optimization program which includes automated exposure control, adjustment of the mA and/or kV according to patient size and/or use of iterative reconstruction technique. CONTRAST:  162m ISOVUE-300 IOPAMIDOL (ISOVUE-300) INJECTION 61% COMPARISON:  05/19/2020 CT chest, abdomen and pelvis. FINDINGS: CT CHEST FINDINGS Cardiovascular: Stable mild cardiomegaly. Mitral valve prosthesis in place. No significant pericardial effusion/thickening. Three-vessel coronary atherosclerosis status post CABG. Atherosclerotic thoracic aorta with dilated 4.1 cm ascending thoracic aorta. Normal caliber pulmonary arteries. Mediastinum/Nodes: No significant thyroid nodules. Unremarkable esophagus. No pathologically enlarged axillary, mediastinal or hilar lymph nodes, noting limited sensitivity for the detection of hilar adenopathy on this noncontrast study. Coarsely calcified nonenlarged right subcarinal node is unchanged and compatible with prior granulomatous disease. Lungs/Pleura: No pneumothorax. No  pleural effusion. No acute consolidative airspace disease, lung masses or significant pulmonary nodules. Subcentimeter calcified right middle lobe granuloma is unchanged. A few scattered thin parenchymal bands in lingula and dependent lower lobes bilaterally, not substantially changed and compatible with nonspecific mild postinfectious/postinflammatory scarring. Musculoskeletal: No aggressive appearing focal osseous lesions. Intact sternotomy wires. Moderate thoracic spondylosis. CT ABDOMEN PELVIS FINDINGS Hepatobiliary: Normal liver with no liver mass. Normal gallbladder with no radiopaque cholelithiasis. No biliary ductal dilatation. Pancreas: Normal, with no mass  or duct dilation. Spleen: Normal size. No mass. Adrenals/Urinary Tract: Normal adrenals. Subcentimeter hypodense renal cortical lesions in the kidneys bilaterally are too small to characterize and require no follow-up. No hydronephrosis. Normal bladder. Stomach/Bowel: Normal non-distended stomach. Normal caliber small bowel with no small bowel wall thickening. Normal appendix. Normal large bowel with no diverticulosis, large bowel wall thickening or pericolonic fat stranding. Vascular/Lymphatic: Atherosclerotic abdominal aorta with 5.8 cm infrarenal abdominal aortic aneurysm status post aorto bi-iliac stent graft repair, increased from 5.1 cm on 05/19/2020 CT with evidence of apparent contrast enhancement within aneurysm sac outside of the grafts suspicious for endoleak. Patent portal, splenic, hepatic and renal veins. No pathologically enlarged lymph nodes in the abdomen or pelvis. Reproductive: Normal size prostate with nonspecific internal prostatic calcifications and fiducial markers. Other: No pneumoperitoneum, ascites or focal fluid collection. Musculoskeletal: No aggressive appearing focal osseous lesions. Marked lumbar spondylosis. IMPRESSION: 1. Aorto bi-iliac stent graft repair of 5.8 cm infrarenal abdominal aortic aneurysm, which has increased  in size from 5.1 cm on 05/19/2020 CT with apparent contrast enhancement within the aneurysm sac suspicious for endoleak, incompletely characterized on this routine IV contrast-enhanced CT abdomen/pelvis study. Recommend vascular surgical consultation. Dedicated CT angiogram of the abdomen and pelvis without and with IV contrast suggested. 2. No evidence of metastatic disease in the chest, abdomen or pelvis. 3. No active pulmonary disease. 4. Stable mild cardiomegaly. 5. Dilated 4.1 cm ascending thoracic aorta. Recommend annual imaging followup by CTA or MRA. This recommendation follows 2010 ACCF/AHA/AATS/ACR/ASA/SCA/SCAI/SIR/STS/SVM Guidelines for the Diagnosis and Management of Patients with Thoracic Aortic Disease. Circulation. 2010; 121: O536-U440. Aortic aneurysm NOS (ICD10-I71.9). 6. Aortic Atherosclerosis (ICD10-I70.0). Electronically Signed   By: Ilona Sorrel M.D.   On: 12/29/2021 15:07   CT CHEST WO CONTRAST  Result Date: 12/29/2021 CLINICAL DATA:  Chronic cough. COPD. History of pulmonary nodules. CABG. Aortic aneurysm repair. History of prostate cancer treated with radiation therapy. * Tracking Code: BO * EXAM: CT CHEST, ABDOMEN, AND PELVIS WITH CONTRAST TECHNIQUE: Multidetector CT imaging of the chest was performed following the standard protocol without IV contrast. Multidetector CT imaging of the abdomen and pelvis was performed following the standard protocol during bolus administration of intravenous contrast. RADIATION DOSE REDUCTION: This exam was performed according to the departmental dose-optimization program which includes automated exposure control, adjustment of the mA and/or kV according to patient size and/or use of iterative reconstruction technique. CONTRAST:  158m ISOVUE-300 IOPAMIDOL (ISOVUE-300) INJECTION 61% COMPARISON:  05/19/2020 CT chest, abdomen and pelvis. FINDINGS: CT CHEST FINDINGS Cardiovascular: Stable mild cardiomegaly. Mitral valve prosthesis in place. No significant  pericardial effusion/thickening. Three-vessel coronary atherosclerosis status post CABG. Atherosclerotic thoracic aorta with dilated 4.1 cm ascending thoracic aorta. Normal caliber pulmonary arteries. Mediastinum/Nodes: No significant thyroid nodules. Unremarkable esophagus. No pathologically enlarged axillary, mediastinal or hilar lymph nodes, noting limited sensitivity for the detection of hilar adenopathy on this noncontrast study. Coarsely calcified nonenlarged right subcarinal node is unchanged and compatible with prior granulomatous disease. Lungs/Pleura: No pneumothorax. No pleural effusion. No acute consolidative airspace disease, lung masses or significant pulmonary nodules. Subcentimeter calcified right middle lobe granuloma is unchanged. A few scattered thin parenchymal bands in lingula and dependent lower lobes bilaterally, not substantially changed and compatible with nonspecific mild postinfectious/postinflammatory scarring. Musculoskeletal: No aggressive appearing focal osseous lesions. Intact sternotomy wires. Moderate thoracic spondylosis. CT ABDOMEN PELVIS FINDINGS Hepatobiliary: Normal liver with no liver mass. Normal gallbladder with no radiopaque cholelithiasis. No biliary ductal dilatation. Pancreas: Normal, with no mass or duct dilation. Spleen: Normal  size. No mass. Adrenals/Urinary Tract: Normal adrenals. Subcentimeter hypodense renal cortical lesions in the kidneys bilaterally are too small to characterize and require no follow-up. No hydronephrosis. Normal bladder. Stomach/Bowel: Normal non-distended stomach. Normal caliber small bowel with no small bowel wall thickening. Normal appendix. Normal large bowel with no diverticulosis, large bowel wall thickening or pericolonic fat stranding. Vascular/Lymphatic: Atherosclerotic abdominal aorta with 5.8 cm infrarenal abdominal aortic aneurysm status post aorto bi-iliac stent graft repair, increased from 5.1 cm on 05/19/2020 CT with evidence of  apparent contrast enhancement within aneurysm sac outside of the grafts suspicious for endoleak. Patent portal, splenic, hepatic and renal veins. No pathologically enlarged lymph nodes in the abdomen or pelvis. Reproductive: Normal size prostate with nonspecific internal prostatic calcifications and fiducial markers. Other: No pneumoperitoneum, ascites or focal fluid collection. Musculoskeletal: No aggressive appearing focal osseous lesions. Marked lumbar spondylosis. IMPRESSION: 1. Aorto bi-iliac stent graft repair of 5.8 cm infrarenal abdominal aortic aneurysm, which has increased in size from 5.1 cm on 05/19/2020 CT with apparent contrast enhancement within the aneurysm sac suspicious for endoleak, incompletely characterized on this routine IV contrast-enhanced CT abdomen/pelvis study. Recommend vascular surgical consultation. Dedicated CT angiogram of the abdomen and pelvis without and with IV contrast suggested. 2. No evidence of metastatic disease in the chest, abdomen or pelvis. 3. No active pulmonary disease. 4. Stable mild cardiomegaly. 5. Dilated 4.1 cm ascending thoracic aorta. Recommend annual imaging followup by CTA or MRA. This recommendation follows 2010 ACCF/AHA/AATS/ACR/ASA/SCA/SCAI/SIR/STS/SVM Guidelines for the Diagnosis and Management of Patients with Thoracic Aortic Disease. Circulation. 2010; 121: Q947-M546. Aortic aneurysm NOS (ICD10-I71.9). 6. Aortic Atherosclerosis (ICD10-I70.0). Electronically Signed   By: Ilona Sorrel M.D.   On: 12/29/2021 15:07     Assessment/Plan 1. Infrarenal abdominal aortic aneurysm (AAA) without rupture (HCC) Recommend:  Patient is status post successful endovascular repair of the AAA.   No further intervention is required at this time.   No endoleak is detected and the aneurysm sac is stable.  The patient will continue antiplatelet therapy as prescribed as well as aggressive management of hyperlipidemia. Exercise is again strongly encouraged.   However,  endografts require continued surveillance with ultrasound or CT scan. This is mandatory to detect any changes that allow repressurization of the aneurysm sac.  The patient is informed that this would be asymptomatic.  The patient is reminded that lifelong routine surveillance is a necessity with an endograft. Patient will continue to follow-up at the specified interval with ultrasound of the aorta.  - VAS US AORTA/IVC/ILIACS; Future  2. Aneurysm of ascending aorta without rupture (HCC) Will check every two to three years with CT scan  3. Permanent atrial fibrillation (HCC) Continue antiarrhythmia medications as already ordered, these medications have been reviewed and there are no changes at this time.  Continue anticoagulation as ordered by Cardiology Service   4. Arteriosclerosis of coronary artery Continue cardiac and antihypertensive medications as already ordered and reviewed, no changes at this time.  Continue statin as ordered and reviewed, no changes at this time  Nitrates PRN for chest pain   5. Primary hypertension Continue antihypertensive medications as already ordered, these medications have been reviewed and there are no changes at this time.   6. Pulmonary emphysema, unspecified emphysema type ([place]) Continue pulmonary medications and aerosols as already ordered, these medications have been reviewed and there are no changes at this time.      [name], MD  01/21/2022 2:49 PM

## 2022-01-26 ENCOUNTER — Ambulatory Visit: Payer: Medicare PPO | Attending: Cardiology | Admitting: Cardiology

## 2022-01-26 ENCOUNTER — Encounter: Payer: Self-pay | Admitting: Cardiology

## 2022-01-26 VITALS — BP 126/70 | HR 72 | Ht 69.5 in | Wt 284.0 lb

## 2022-01-26 DIAGNOSIS — I4821 Permanent atrial fibrillation: Secondary | ICD-10-CM

## 2022-01-26 DIAGNOSIS — I10 Essential (primary) hypertension: Secondary | ICD-10-CM | POA: Diagnosis not present

## 2022-01-26 DIAGNOSIS — Z951 Presence of aortocoronary bypass graft: Secondary | ICD-10-CM | POA: Diagnosis not present

## 2022-01-26 DIAGNOSIS — I77810 Thoracic aortic ectasia: Secondary | ICD-10-CM | POA: Diagnosis not present

## 2022-01-26 NOTE — Patient Instructions (Signed)
Medication Instructions:   Your physician recommends that you continue on your current medications as directed. Please refer to the Current Medication list given to you today.   *If you need a refill on your cardiac medications before your next appointment, please call your pharmacy*   Follow-Up: At [facility], you and your health needs are our priority.  As part of our continuing mission to provide you with exceptional heart care, we have created designated Provider Care Teams.  These Care Teams include your primary Cardiologist (physician) and Advanced Practice Providers (APPs -  Physician Assistants and Nurse Practitioners) who all work together to provide you with the care you need, when you need it.  We recommend signing up for the patient portal called "MyChart".  Sign up information is provided on this After Visit Summary.  MyChart is used to connect with patients for Virtual Visits (Telemedicine).  Patients are able to view lab/test results, encounter notes, upcoming appointments, etc.  Non-urgent messages can be sent to your provider as well.   To learn more about what you can do with MyChart, go to [email or web address].    Your next appointment:   1 year(s)  The format for your next appointment:   In Person  Provider:   You may see Brian Agbor-Etang, MD or one of the following Advanced Practice Providers on your designated Care Team:   Christopher Berge, NP Ryan Dunn, PA-C Cadence Furth, PA-C Sheri Hammock, NP    Other Instructions   Important Information About Sugar       

## 2022-01-26 NOTE — Progress Notes (Signed)
**Note Leslie-Identified via Obfuscation** Cardiology Office Note:    Date:  01/26/2022   ID:  Leslie Prince, DOB 09/25/1945, MRN [id number]  PCP:  Leslie Pitch, MD  [facility] Cardiologist:  Leslie Sable, MD  [facility] Electrophysiologist:  None   Referring MD: Leslie Pitch, MD   Chief Complaint  Patient presents with   Follow-up    6 month f/u, concerns about Afib getting worse, fatigue    History of Present Illness:    Leslie Prince is a 76 y.o. male with a hx of chronic atrial fibrillation on Pradaxa, CAD PCI w/ DES to LCx 2011, CABG x 3 in 1997, mitral valve repair 1997, AAA s/p endovascular stent repair 11/2019, hypertension, former smoker, COPD who presents for follow-up.    Being seen for mild ascending aorta dilatation, atrial fibrillation.  Recent chest CT obtained for lung cancer screening last month showed stable ascending aorta size measuring 4.1 cm.  Occasionally has leg edema, usually controlled with current dose of Lasix 40 mg daily.  Follows up with vascular surgery due to AAA and endovascular repair.  Compliant with medications as prescribed, occasional bruises with Pradaxa but no overt bleeding.   Prior notes Echo 08/2021 EF 55 to 60%, normal functioning mitral valve prosthesis/ring. Patient had a recent abdominal CT showing abdominal aortic aneurysm up to 5 cm.  Endovascular repair by vascular surgery was performed on 11/27/2019.   Echocardiogram 11/2019 showed low normal ejection fraction, EF 50 to 55%.  Mild ascending aorta dilatation, 42 mm.   Past Medical History:  Diagnosis Date   Atrial fibrillation (Leslie [facility])    Cancer [facility])    prostate cancer   CHF (congestive heart failure) (HCC)    COPD (chronic obstructive pulmonary disease) (HCC)    Coronary artery disease    Depression    Hepatitis A infection    History of colon polyps    Hyperlipidemia    Hypertension    Mitral valve problem    Sleep apnea     Past Surgical History:  Procedure Laterality Date   CARDIAC  SURGERY     CARDIAC VALVE REPLACEMENT     CARDIOVERSION  2013   CATARACT EXTRACTION W/PHACO Right 08/06/2019   Procedure: CATARACT EXTRACTION PHACO AND INTRAOCULAR LENS PLACEMENT (IOC) RIGHT 12.34  01:25.4  14.5%;  Surgeon: Leslie Koyanagi, MD;  Location: [facility];  Service: Ophthalmology;  Laterality: Right;   CATARACT EXTRACTION W/PHACO Left 08/27/2019   Procedure: CATARACT EXTRACTION PHACO AND INTRAOCULAR LENS PLACEMENT (IOC) LEFT 7.92 01:16.9 10.3%;  Surgeon: Leslie Koyanagi, MD;  Location: Leslie [facility];  Service: Ophthalmology;  Laterality: Left;  sleep apnea   COLONOSCOPY N/A 09/28/2021   Procedure: COLONOSCOPY;  Surgeon: Leslie Prince, Leslie Pike, MD;  Location: Leslie [facility];  Service: Gastroenterology;  Laterality: N/A;  Cannot move up   COLONOSCOPY WITH PROPOFOL N/A 11/07/2017   Procedure: COLONOSCOPY WITH PROPOFOL;  Surgeon: Leslie Silvas, MD;  Location: Leslie [facility];  Service: [facility];  Laterality: N/A;   CORONARY ANGIOPLASTY WITH STENT PLACEMENT  2011   CORONARY ARTERY BYPASS GRAFT     during mitral valve repair   ENDOVASCULAR REPAIR/STENT GRAFT N/A 11/26/2019   Procedure: ENDOVASCULAR REPAIR/STENT GRAFT;  Surgeon: Leslie Cabal, MD;  Location: [facility];  Service: Cardiovascular;  Laterality: N/A;   EYE SURGERY     MITRAL VALVE REPAIR  1997    Current Medications: Current Meds  Medication Sig   albuterol (PROVENTIL) (2.5 MG/3ML) 0.083% nebulizer solution Take 2.5 mg by nebulization every  6 (six) hours as needed for wheezing.   albuterol (VENTOLIN HFA) 108 (90 Base) MCG/ACT inhaler Inhale 2 puffs into the lungs every 6 (six) hours as needed for wheezing.    allopurinol (ZYLOPRIM) 100 MG tablet Take 100 mg by mouth daily.   atorvastatin (LIPITOR) 20 MG tablet Take 20 mg by mouth at bedtime.    calcium carbonate (TUMS - DOSED IN MG ELEMENTAL CALCIUM) 500 MG chewable tablet Chew 1 tablet by mouth 2 (two) times daily.   colchicine 0.6  MG tablet Take 0.6 mg by mouth as directed. Take 2 tablets at onset of gout flare, then 1 tablet one hour later   dabigatran (PRADAXA) 150 MG CAPS capsule Take 1 capsule (150 mg total) by mouth 2 (two) times daily.   enalapril (VASOTEC) 20 MG tablet Take 1 tablet (20 mg total) by mouth 2 (two) times daily.   fluticasone (FLONASE) 50 MCG/ACT nasal spray Place into both nostrils daily.   Fluticasone-Salmeterol (ADVAIR) 250-50 MCG/DOSE AEPB Inhale 1 puff into the lungs 2 (two) times daily.    furosemide (LASIX) 40 MG tablet Take 40 mg by mouth daily.    KLOR-CON M10 10 MEQ tablet Take 2 tablets (20 mEq total) by mouth 2 (two) times daily.   metoprolol succinate (TOPROL-XL) 100 MG 24 hr tablet Take 100 mg by mouth daily.   Misc Natural Products (TART CHERRY ADVANCED PO) Take by mouth daily.    montelukast (SINGULAIR) 10 MG tablet Take 10 mg by mouth at bedtime.    Multiple Vitamins tablet Take 1 tablet by mouth daily.    sildenafil (VIAGRA) 100 MG tablet Take 1 tablet (100 mg total) by mouth daily as needed for erectile dysfunction.   tamsulosin (FLOMAX) 0.4 MG CAPS capsule TAKE 1 CAPSULE (0.4 MG TOTAL) BY MOUTH DAILY AFTER SUPPER.   tiotropium (SPIRIVA) 18 MCG inhalation capsule Place 18 mcg into inhaler and inhale daily.     Allergies:   Lisinopril   Social History   Socioeconomic History   Marital status: Married    Spouse name: Not on file   Number of children: Not on file   Years of education: Not on file   Highest education level: Not on file  Occupational History   Not on file  Tobacco Use   Smoking status: Former    Packs/day: 1.00    Years: 25.00    Total pack years: 25.00    Types: Cigarettes    Quit date: 06/21/1995    Years since quitting: 26.6   Smokeless tobacco: Never  Vaping Use   Vaping Use: Never used  Substance and Sexual Activity   Alcohol use: Not Currently    Comment: Rare   Drug use: Never   Sexual activity: Yes    Birth control/protection: None  Other  Topics Concern   Not on file  Social History Narrative   Not on file   Social Determinants of Health   Financial Resource Strain: Not on file  Food Insecurity: Not on file  Transportation Needs: Not on file  Physical Activity: Not on file  Stress: Not on file  Social Connections: Not on file     Family History: The patient's family history includes Hypertension in his mother; Valvular heart disease in his father.  ROS:   Please see the history of present illness.     All other systems reviewed and are negative.  EKGs/Labs/Other Studies Reviewed:    The following studies were reviewed today:   EKG:  EKG is ordered today.  EKG shows atrial fibrillation, heart rate 72.  Recent Labs: 08/17/2021: BUN 18; Creatinine, Ser 0.92; Hemoglobin 11.5; Platelets 140; Potassium 4.0; Sodium 137  Recent Lipid Panel No results found for: "CHOL", "TRIG", "HDL", "CHOLHDL", "VLDL", "LDLCALC", "LDLDIRECT"  Physical Exam:    VS:  BP 126/70 (BP Location: Left Arm, Patient Position: Sitting, Cuff Size: Large)   Pulse 72   Ht 5' 9.5" (1.765 m)   Wt 284 lb (128.8 kg)   SpO2 93%   BMI 41.34 kg/m     Wt Readings from Last 3 Encounters:  01/26/22 284 lb (128.8 kg)  01/16/22 282 lb 9.6 oz (128.2 kg)  12/19/21 281 lb 9.6 oz (127.7 kg)     GEN:  Well nourished, well developed in no acute distress HEENT: Normal NECK: No JVD; No carotid bruits CARDIAC: Irregular irregular, no murmurs, rubs, gallops RESPIRATORY: Diminished breath sounds at bases ABDOMEN: Soft, non-tender, non-distended MUSCULOSKELETAL:  1+ edema; No deformity  SKIN: Warm and dry NEUROLOGIC:  Alert and oriented x 3 PSYCHIATRIC:  Normal affect   ASSESSMENT:    1. Hx of CABG   2. Permanent atrial fibrillation ([place])   3. Primary hypertension   4. Ascending aorta dilation (HCC)    PLAN:    In order of problems listed above:  CAD/CABG x 3 1997, PCI to the left circumflex.  Denies chest pain.  Echo on 11/2019 preserved, EF  50 to 55%. Cont statin, beta-blocker, Pradaxa.  Take extra Lasix if needed for edema.  Continue Lasix 40 daily. permanent atrial fibrillation, heart rate controlled.  Continue Toprol-XL and Pradaxa.   hypertension, BP elevated, usually controlled   Continue Toprol, enalapril.   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  Stable from prior, serial monitoring with CT scans obtained for lung cancer screening.  Follow-up in 12 months   Total encounter 40 minutes  Greater than 50% was spent in counseling and coordination of care with the patient  Medication Adjustments/Labs and Tests Ordered: Current medicines are reviewed at length with the patient today.  Concerns regarding medicines are outlined above.  Orders Placed This Encounter  Procedures   EKG 12-Lead    No orders of the defined types were placed in this encounter.    Patient Instructions  Medication Instructions:   Your physician recommends that you continue on your current medications as directed. Please refer to the Current Medication list given to you today.  *If you need a refill on your cardiac medications before your next appointment, please call your pharmacy*   Follow-Up: At [facility], you and your health needs are our priority.  As part of our continuing mission to provide you with exceptional heart care, we have created designated Provider Care Teams.  These Care Teams include your primary Cardiologist (physician) and Advanced Practice Providers (APPs -  Physician Assistants and Nurse Practitioners) who all work together to provide you with the care you need, when you need it.  We recommend signing up for the patient portal called "MyChart".  Sign up information is provided on this After Visit Summary.  MyChart is used to connect with patients for Virtual Visits (Telemedicine).  Patients are able to view Prince/test results, encounter notes, upcoming appointments, etc.  Non-urgent messages can be sent to your  provider as well.   To learn more about what you can do with MyChart, go to [email or web address].    Your next appointment:   1 year(s)  The format for your next appointment:  In Person  Provider:   You may see Leslie Sable, MD or one of the following Advanced Practice Providers on your designated Care Team:   Murray Hodgkins, NP Christell Faith, PA-C Cadence Kathlen Mody, PA-C Gerrie Nordmann, NP    Other Instructions   Important Information About Sugar         Signed, Leslie Sable, MD  01/26/2022 2:01 PM    [place]

## 2022-02-06 ENCOUNTER — Encounter (INDEPENDENT_AMBULATORY_CARE_PROVIDER_SITE_OTHER): Payer: Self-pay

## 2022-03-10 ENCOUNTER — Other Ambulatory Visit: Payer: Self-pay

## 2022-03-10 MED ORDER — ENALAPRIL MALEATE 20 MG PO TABS: 20.0000 mg | ORAL_TABLET | Freq: Two times a day (BID) | ORAL | 2 refills | Status: DC

## 2022-03-27 ENCOUNTER — Other Ambulatory Visit: Payer: Self-pay

## 2022-03-27 DIAGNOSIS — I4821 Permanent atrial fibrillation: Secondary | ICD-10-CM

## 2022-03-27 MED ORDER — DABIGATRAN ETEXILATE MESYLATE 150 MG PO CAPS: 150.0000 mg | ORAL_CAPSULE | Freq: Two times a day (BID) | ORAL | 1 refills | Status: DC

## 2022-03-27 NOTE — Telephone Encounter (Signed)
Prescription refill request for Pradaxa received.  Indication:afib Last office visit:10/23 Weight:128.8  kg Age:76 Scr:0.8 CrCl:138.64  ml/min  Prescription refilled

## 2022-05-24 ENCOUNTER — Inpatient Hospital Stay: Payer: Medicare PPO | Attending: Radiation Oncology

## 2022-05-24 DIAGNOSIS — C61 Malignant neoplasm of prostate: Secondary | ICD-10-CM | POA: Diagnosis not present

## 2022-05-24 LAB — PSA: Prostatic Specific Antigen: <0.01 ng/mL (ref 0.00–4.00)

## 2022-05-31 ENCOUNTER — Ambulatory Visit
Admission: RE | Admit: 2022-05-31 | Discharge: 2022-05-31 | Disposition: A | Discharge status: Home / Self Care | Payer: Medicare PPO | Source: Ambulatory Visit | Attending: Radiation Oncology | Admitting: Radiation Oncology

## 2022-05-31 ENCOUNTER — Encounter: Payer: Self-pay | Admitting: Radiation Oncology

## 2022-05-31 VITALS — BP 136/78 | HR 69 | Temp 98.6°F | Resp 20 | Ht 69.0 in | Wt 278.0 lb

## 2022-05-31 DIAGNOSIS — C61 Malignant neoplasm of prostate: Secondary | ICD-10-CM | POA: Diagnosis present

## 2022-05-31 DIAGNOSIS — Z923 Personal history of irradiation: Secondary | ICD-10-CM | POA: Diagnosis not present

## 2022-05-31 NOTE — Progress Notes (Signed)
Radiation Oncology Follow up Note  Name: Leslie Prince   Date:   05/31/2022 MRN:  [id number] DOB: Mar 24, 1946    This 77 y.o. male presents to the clinic today for 4-year follow-up status post IMRT radiation therapy for Gleason 8 (4+4) adenocarcinoma the prostate presenting the PSA of 5.9.  REFERRING PROVIDER: [name], MD  HPI: Patient is a 77 year old male now out over 4 years having completed IMRT radiation therapy to his prostate for Gleason 8 adenocarcinoma.  Seen today in routine follow-up he is doing well.  Specifically denies any increased lower urinary tract symptoms diarrhea or fatigue.  His most recent PSA continues to be less than 0.01.Marland Kitchen  COMPLICATIONS OF TREATMENT: none  FOLLOW UP COMPLIANCE: keeps appointments   PHYSICAL EXAM:  BP 136/78 (BP Location: Right Arm, Patient Position: Sitting, Cuff Size: Normal)   Pulse 69   Temp 98.6 F (37 C) (Tympanic)   Resp 20   Ht 5' 9"$  (1.753 m) Comment: Stated HT  Wt 278 lb (126.1 kg)   BMI 41.05 kg/m  Well-developed well-nourished patient in NAD. HEENT reveals PERLA, EOMI, discs not visualized.  Oral cavity is clear. No oral mucosal lesions are identified. Neck is clear without evidence of cervical or supraclavicular adenopathy. Lungs are clear to A&P. Cardiac examination is essentially unremarkable with regular rate and rhythm without murmur rub or thrill. Abdomen is benign with no organomegaly or masses noted. Motor sensory and DTR levels are equal and symmetric in the upper and lower extremities. Cranial nerves II through XII are grossly intact. Proprioception is intact. No peripheral adenopathy or edema is identified. No motor or sensory levels are noted. Crude visual fields are within normal range.  RADIOLOGY RESULTS: No current films for review  PLAN: Present time patient is doing well under excellent biochemical control of his prostate cancer.  At this time I am comfortable turning over care to his PMD with a yearly PSA.   I would be happy to reevaluate him anytime should there be any abnormality that test or any problems develop.  Patient is to call with any concerns.  I would like to take this opportunity to thank you for allowing me to participate in the care of your patient.Noreene Filbert, MD

## 2022-06-02 ENCOUNTER — Other Ambulatory Visit: Payer: Medicare PPO

## 2022-06-08 ENCOUNTER — Ambulatory Visit: Payer: Medicare PPO | Admitting: Urology

## 2022-06-08 VITALS — BP 130/73 | HR 83 | Ht 69.0 in | Wt 275.0 lb

## 2022-06-08 DIAGNOSIS — C61 Malignant neoplasm of prostate: Secondary | ICD-10-CM | POA: Diagnosis not present

## 2022-06-08 DIAGNOSIS — N529 Male erectile dysfunction, unspecified: Secondary | ICD-10-CM | POA: Diagnosis not present

## 2022-06-08 MED ORDER — TAMSULOSIN HCL 0.4 MG PO CAPS: 0.4000 mg | ORAL_CAPSULE | Freq: Every day | ORAL | 3 refills | Status: DC

## 2022-06-08 NOTE — Progress Notes (Signed)
   06/08/2022 2:51 PM   [name] May 26, 1945 [id number]  Reason for visit: Follow up prostate cancer, OAB, ED  HPI: I saw Mr. Leslie Prince back in clinic for follow-up of the above issues.  He is a 77 year old male with morbid obesity BMI 41 who was diagnosed with high risk prostate cancer in December 2019 for an abnormal DRE with a PSA of 5.9.  He ultimately underwent treatment with 2 years of ADT and external beam radiation.  Last dose of ADT was in July 2021, and he completed 2 years total.  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  He has overall been doing well.  PSA remains undetectable, and was most recently checked on 05/24/22 indicating excellent cancer control.  We discussed the need to continue to follow the PSA on a yearly basis.  He really denies any significant urinary symptoms today, he continues on Flomax.  We discussed he could trial off the Flomax to see if his urinary symptoms worsen at all.  Flomax was refilled and he would like to continue for the time being.  He started cranberry supplements recently which he also feels has improved his urinary symptoms.  In terms of erections, he is no longer sexually active, and does not need a refill on the sildenafil.  Flomax refilled RTC 1 year PSA prior(prefers to have PSA done through PCP)   Billey Co, MD  [facility] [address], [place], [place] [address] [phone]

## 2022-06-15 ENCOUNTER — Encounter: Payer: Self-pay | Admitting: Cardiology

## 2022-06-15 ENCOUNTER — Encounter (INDEPENDENT_AMBULATORY_CARE_PROVIDER_SITE_OTHER): Payer: Self-pay | Admitting: Vascular Surgery

## 2022-06-17 NOTE — Progress Notes (Unsigned)
MRN : [id number]  Leslie Prince is a 77 y.o. (28-Sep-1945) male who presents with chief complaint of check circulation.  History of Present Illness:  The patient is seen for follow up evaluation of AAA status post EVAR.   Procedure on 11/26/2019:  Placement of a 28 x 14 x 18 C3 Gore Excluder Endoprosthesis main body 16 x 14 contralateral limb and a 16 x 10 extender on the left  The patient denies interval development of abdominal or lower back pain. No new lower extremity pain or discoloration of the toes.   Patient is also followed for a small ascending thoracic aortic aneurysm.  He denies unusual chest pain or upper back pain.   The patient denies recent episodes of angina or shortness of breath. The patient denies interval anaurosis fugax. There is no recent history of TIA symptoms or focal motor deficits. The patient denies PAD or claudication symptoms.    CT angiography of the abdomen and pelvis dated 12/27/2021 showed an infrarenal AAA 5.3 cm.  This agrees with the duplex.   Also noted is an ascending thoracic aortic aneurysm of 4.1 cm  Duplex ultrasound of the abdominal aorta demonstrates the stent is patent a probable type II endoleak is identified.  The sac now measures 5.56 cm  No outpatient medications have been marked as taking for the 06/19/22 encounter (Appointment) with Delana Meyer, Dolores Lory, MD.    Past Medical History:  Diagnosis Date   Atrial fibrillation ([place])    Cancer [facility])    prostate cancer   CHF (congestive heart failure) ([place])    COPD (chronic obstructive pulmonary disease) ([place])    Coronary artery disease    Depression    Hepatitis A infection    History of colon polyps    Hyperlipidemia    Hypertension    Mitral valve problem    Sleep apnea     Past Surgical History:  Procedure Laterality Date   CARDIAC SURGERY     CARDIAC VALVE REPLACEMENT     CARDIOVERSION  2013   CATARACT EXTRACTION W/PHACO Right 08/06/2019   Procedure:  CATARACT EXTRACTION PHACO AND INTRAOCULAR LENS PLACEMENT (IOC) RIGHT 12.34  01:25.4  14.5%;  Surgeon: Leandrew Koyanagi, MD;  Location: [place];  Service: Ophthalmology;  Laterality: Right;   CATARACT EXTRACTION W/PHACO Left 08/27/2019   Procedure: CATARACT EXTRACTION PHACO AND INTRAOCULAR LENS PLACEMENT (IOC) LEFT 7.92 01:16.9 10.3%;  Surgeon: Leandrew Koyanagi, MD;  Location: [place];  Service: Ophthalmology;  Laterality: Left;  sleep apnea   COLONOSCOPY N/A 09/28/2021   Procedure: COLONOSCOPY;  Surgeon: Toledo, Benay Pike, MD;  Location: [facility] ENDOSCOPY;  Service: Gastroenterology;  Laterality: N/A;  Cannot move up   COLONOSCOPY WITH PROPOFOL N/A 11/07/2017   Procedure: COLONOSCOPY WITH PROPOFOL;  Surgeon: Manya Silvas, MD;  Location: [facility];  Service: Endoscopy;  Laterality: N/A;   CORONARY ANGIOPLASTY WITH STENT PLACEMENT  2011   CORONARY ARTERY BYPASS GRAFT     during mitral valve repair   ENDOVASCULAR REPAIR/STENT GRAFT N/A 11/26/2019   Procedure: ENDOVASCULAR REPAIR/STENT GRAFT;  Surgeon: Katha Cabal, MD;  Location: [place] CV LAB;  Service: Cardiovascular;  Laterality: N/A;   EYE SURGERY     MITRAL VALVE REPAIR  1997    Social History Social History   Tobacco Use   Smoking status: Former    Packs/day: 1.00    Years: 25.00  Total pack years: 25.00    Types: Cigarettes    Quit date: 06/21/1995    Years since quitting: 27.0   Smokeless tobacco: Never  Vaping Use   Vaping Use: Never used  Substance Use Topics   Alcohol use: Not Currently    Comment: Rare   Drug use: Never    Family History Family History  Problem Relation Age of Onset   Hypertension Mother    Valvular heart disease Father     Allergies  Allergen Reactions   Lisinopril Other (See Comments)    Severe urination Other reaction(s): Other (See Comments), Other (See Comments) Severe urination      REVIEW OF SYSTEMS (Negative unless  checked)  Constitutional: '[]'$ Weight loss  '[]'$ Fever  '[]'$ Chills Cardiac: '[]'$ Chest pain   '[]'$ Chest pressure   '[]'$ Palpitations   '[]'$ Shortness of breath when laying flat   '[]'$ Shortness of breath with exertion. Vascular:  '[x]'$ Pain in legs with walking   '[]'$ Pain in legs at rest  '[]'$ History of DVT   '[]'$ Phlebitis   '[]'$ Swelling in legs   '[]'$ Varicose veins   '[]'$ Non-healing ulcers Pulmonary:   '[]'$ Uses home oxygen   '[]'$ Productive cough   '[]'$ Hemoptysis   '[]'$ Wheeze  '[]'$ COPD   '[]'$ Asthma Neurologic:  '[]'$ Dizziness   '[]'$ Seizures   '[]'$ History of stroke   '[]'$ History of TIA  '[]'$ Aphasia   '[]'$ Vissual changes   '[]'$ Weakness or numbness in arm   '[]'$ Weakness or numbness in leg Musculoskeletal:   '[]'$ Joint swelling   '[]'$ Joint pain   '[]'$ Low back pain Hematologic:  '[]'$ Easy bruising  '[]'$ Easy bleeding   '[]'$ Hypercoagulable state   '[]'$ Anemic Gastrointestinal:  '[]'$ Diarrhea   '[]'$ Vomiting  '[]'$ Gastroesophageal reflux/heartburn   '[]'$ Difficulty swallowing. Genitourinary:  '[]'$ Chronic kidney disease   '[]'$ Difficult urination  '[]'$ Frequent urination   '[]'$ Blood in urine Skin:  '[]'$ Rashes   '[]'$ Ulcers  Psychological:  '[]'$ History of anxiety   '[]'$  History of major depression.  Physical Examination  There were no vitals filed for this visit. There is no height or weight on file to calculate BMI. Gen: WD/WN, NAD Head: [place]/AT, No temporalis wasting.  Ear/Nose/Throat: Hearing grossly intact, nares w/o erythema or drainage Eyes: PER, EOMI, sclera nonicteric.  Neck: Supple, no masses.  No bruit or JVD.  Pulmonary:  Good air movement, no audible wheezing, no use of accessory muscles.  Cardiac: RRR, normal S1, S2, no Murmurs. Vascular:  mild trophic changes, no open wounds Vessel Right Left  Radial Palpable Palpable  Gastrointestinal: soft, non-distended. No guarding/no peritoneal signs.  Musculoskeletal: M/S 5/5 throughout.  No visible deformity.  Neurologic: CN 2-12 intact. Pain and light touch intact in extremities.  Symmetrical.  Speech is fluent. Motor exam as listed above. Psychiatric:  Judgment intact, Mood & affect appropriate for pt's clinical situation. Dermatologic: No rashes or ulcers noted.  No changes consistent with cellulitis.   CBC Lab Results  Component Value Date   WBC 5.6 08/17/2021   HGB 11.5 (L) 08/17/2021   HCT 35.0 (L) 08/17/2021   MCV 93.1 08/17/2021   PLT 140 (L) 08/17/2021    BMET    Component Value Date/Time   NA 137 08/17/2021 1229   K 4.0 08/17/2021 1229   CL 105 08/17/2021 1229   CO2 25 08/17/2021 1229   GLUCOSE 94 08/17/2021 1229   BUN 18 08/17/2021 1229   CREATININE 0.92 08/17/2021 1229   CALCIUM 9.3 08/17/2021 1229   GFRNONAA >60 08/17/2021 1229   GFRAA >60 11/27/2019 0545   CrCl cannot be calculated (Patient's most recent lab result is older than  the maximum 21 days allowed.).  COAG Lab Results  Component Value Date   INR 2.0 (H) 11/19/2019   INR 1.7 (H) 11/15/2018    Radiology No results found.   Assessment/Plan 1. 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  The sac now measures 5.6 cm by duplex scan and based on this study it appears that it is a type II endoleak.  The patient is otherwise in reasonable health.   Therefore, the patient should undergo further evaluation to prevent future leathal rupture.   Patient will require CT angiography of the abdomen and pelvis in order to appropriately plan further treatment of the AAA and the probable endoleak if needed.  The risks and benefits as well as the alternative therapies was discussed in detail with the patient. All questions were answered. The patient agrees to move forward the AAA repair.  Therefore a CT angiogram with be scheduled as an outpatient.  The patient will follow up with me in the office after the CT scan to review the study and finalize plan for repair.  CT is ordered although it does not appear  to be populating here  2. Aneurysm of ascending aorta without rupture (HCC) Recommend:  No surgery or intervention is indicated at this time.  The patient has an asymptomatic thoracic aortic aneurysm that is less than 6.0 cm in maximal diameter.  I have discussed the natural history of thoracic aortic aneurysm and the small risk of rupture for aneurysm less than 6.5 cm in size.  However, as these small aneurysms tend to enlarge over time, continued surveillance with CT scan is mandatory.   I have also discussed optimizing medical management with hypertension and lipid control and the importance of abstinence from tobacco.  The patient is also encouraged to exercise a minimum of 30 minutes 4 times a week.   Should the patient develop new onset chest or back pain or signs of peripheral embolization they are instructed to seek medical attention immediately and to alert the physician providing care that they have an aneurysm in the chest.   The patient voices their understanding.  The patient will return as ordered with a CT scan of the chest  3. PAD (peripheral artery disease) (HCC) Recommend:  I do not find evidence of life style limiting vascular disease. The patient specifically denies life style limitation.  Previous noninvasive studies including ABI's of the legs do not identify critical vascular problems.  The patient should continue walking and begin a more formal exercise program. The patient should continue his antiplatelet therapy and aggressive treatment of the lipid abnormalities.  The patient is instructed to call the office if there is a significant change in the lower extremity symptoms, particularly if a wound develops or there is an abrupt increase in leg pain.  4. Coronary artery disease of native artery of native heart with stable angina pectoris (HCC) Continue cardiac and antihypertensive medications as already ordered and reviewed, no changes at this time.  Continue  statin as ordered and reviewed, no changes at this time  Nitrates PRN for chest pain  5. Permanent atrial fibrillation (HCC) Continue antiarrhythmia medications as already ordered, these medications have been reviewed and there are no changes at this time.  Continue anticoagulation as ordered by Cardiology Service  6. Infrarenal abdominal aortic aneurysm (AAA) ([place]) See #1 - CT ANGIO ABDOMEN  PELVIS  W &/OR WO CONTRAST; Future    [name], MD  06/17/2022 2:30 PM

## 2022-06-19 ENCOUNTER — Encounter (INDEPENDENT_AMBULATORY_CARE_PROVIDER_SITE_OTHER): Payer: Self-pay | Admitting: Vascular Surgery

## 2022-06-19 ENCOUNTER — Ambulatory Visit (INDEPENDENT_AMBULATORY_CARE_PROVIDER_SITE_OTHER): Payer: Medicare PPO

## 2022-06-19 ENCOUNTER — Ambulatory Visit (INDEPENDENT_AMBULATORY_CARE_PROVIDER_SITE_OTHER): Payer: Medicare PPO | Admitting: Vascular Surgery

## 2022-06-19 VITALS — BP 133/73 | HR 68 | Resp 18 | Ht 69.0 in | Wt 280.0 lb

## 2022-06-19 DIAGNOSIS — I7133 Infrarenal abdominal aortic aneurysm, ruptured: Secondary | ICD-10-CM

## 2022-06-19 DIAGNOSIS — I4821 Permanent atrial fibrillation: Secondary | ICD-10-CM

## 2022-06-19 DIAGNOSIS — I7121 Aneurysm of the ascending aorta, without rupture: Secondary | ICD-10-CM

## 2022-06-19 DIAGNOSIS — I739 Peripheral vascular disease, unspecified: Secondary | ICD-10-CM | POA: Diagnosis not present

## 2022-06-19 DIAGNOSIS — I7143 Infrarenal abdominal aortic aneurysm, without rupture: Secondary | ICD-10-CM

## 2022-06-19 DIAGNOSIS — I25118 Atherosclerotic heart disease of native coronary artery with other forms of angina pectoris: Secondary | ICD-10-CM | POA: Diagnosis not present

## 2022-07-04 ENCOUNTER — Encounter (INDEPENDENT_AMBULATORY_CARE_PROVIDER_SITE_OTHER): Payer: Self-pay | Admitting: Vascular Surgery

## 2022-07-10 NOTE — Telephone Encounter (Signed)
Spoke with pt to advise that CT received a prior auth. I advised pt to call [phone] and schedule. Once scheduled, please call us back at the office to get an appt for CT results with Dr. Delana Meyer.

## 2022-07-21 ENCOUNTER — Ambulatory Visit
Admission: RE | Admit: 2022-07-21 | Discharge: 2022-07-21 | Disposition: A | Discharge status: Home / Self Care | Payer: Medicare PPO | Source: Ambulatory Visit | Attending: Vascular Surgery | Admitting: Vascular Surgery

## 2022-07-21 DIAGNOSIS — I7133 Infrarenal abdominal aortic aneurysm, ruptured: Secondary | ICD-10-CM | POA: Diagnosis present

## 2022-07-21 MED ORDER — IOHEXOL 350 MG/ML SOLN
100.0000 mL | Freq: Once | INTRAVENOUS | Status: AC | PRN
  Administered 2022-07-21: 100 mL via INTRAVENOUS

## 2022-07-30 NOTE — Progress Notes (Unsigned)
MRN : [id number]  Leslie Prince is a 77 y.o. (June 16, 1945) male who presents with chief complaint of check circulation.  History of Present Illness:   The patient is seen for follow up evaluation of AAA status post EVAR.    Procedure on 11/26/2019:  Placement of a 28 x 14 x 18 C3 Gore Excluder Endoprosthesis main body 16 x 14 contralateral limb and a 16 x 10 extender on the left   The patient denies interval development of abdominal or lower back pain. No new lower extremity pain or discoloration of the toes.    Patient is also followed for a small ascending thoracic aortic aneurysm.  He denies unusual chest pain or upper back pain.   The patient denies recent episodes of angina or shortness of breath. The patient denies interval anaurosis fugax. There is no recent history of TIA symptoms or focal motor deficits. The patient denies PAD or claudication symptoms.    CT angiography of the abdomen and pelvis dated 12/27/2021 showed an infrarenal AAA 5.3 cm.  This agrees with the duplex.   Also noted is an ascending thoracic aortic aneurysm of 4.1 cm   Duplex ultrasound of the abdominal aorta demonstrates the stent is patent a probable type II endoleak is identified.  The sac now measures 5.56 cm  No outpatient medications have been marked as taking for the 07/31/22 encounter (Appointment) with Gilda Crease, Latina Craver, MD.    Past Medical History:  Diagnosis Date   Atrial fibrillation (HCC)    Cancer [facility])    prostate cancer   CHF (congestive heart failure) (HCC)    COPD (chronic obstructive pulmonary disease) (HCC)    Coronary artery disease    Depression    Hepatitis A infection    History of colon polyps    Hyperlipidemia    Hypertension    Mitral valve problem    Sleep apnea     Past Surgical History:  Procedure Laterality Date   CARDIAC SURGERY     CARDIAC VALVE REPLACEMENT     CARDIOVERSION  2013   CATARACT EXTRACTION W/PHACO Right 08/06/2019    Procedure: CATARACT EXTRACTION PHACO AND INTRAOCULAR LENS PLACEMENT (IOC) RIGHT 12.34  01:25.4  14.5%;  Surgeon: Lockie Mola, MD;  Location: [facility];  Service: Ophthalmology;  Laterality: Right;   CATARACT EXTRACTION W/PHACO Left 08/27/2019   Procedure: CATARACT EXTRACTION PHACO AND INTRAOCULAR LENS PLACEMENT (IOC) LEFT 7.92 01:16.9 10.3%;  Surgeon: Lockie Mola, MD;  Location: [facility];  Service: Ophthalmology;  Laterality: Left;  sleep apnea   COLONOSCOPY N/A 09/28/2021   Procedure: COLONOSCOPY;  Surgeon: Toledo, Boykin Nearing, MD;  Location: [facility] ENDOSCOPY;  Service: Gastroenterology;  Laterality: N/A;  Cannot move up   COLONOSCOPY WITH PROPOFOL N/A 11/07/2017   Procedure: COLONOSCOPY WITH PROPOFOL;  Surgeon: Scot Jun, MD;  Location: [facility] END[facility]COPY;  Service: Endoscopy;  Laterality: N/A;   CORONARY ANGIOPLASTY WITH STENT PLACEMENT  2011   CORONARY ARTERY BYPASS GRAFT     during mitral valve repair   ENDOVASCULAR REPAIR/STENT GRAFT N/A 11/26/2019   Procedure: ENDOVASCULAR REPAIR/STENT GRAFT;  Surgeon: Renford Dills, MD;  Location: [facility] INVASIVE CV LAB;  Service: Cardiovascular;  Laterality: N/A;   EYE SURGERY     MITRAL VALVE REPAIR  1997    Social History Social History   Tobacco Use   Smoking status: Former    Packs/day: 1.00  Years: 25.00    Additional pack years: 0.00    Total pack years: 25.00    Types: Cigarettes    Quit date: 06/21/1995    Years since quitting: 27.1   Smokeless tobacco: Never  Vaping Use   Vaping Use: Never used  Substance Use Topics   Alcohol use: Not Currently    Comment: Rare   Drug use: Never    Family History Family History  Problem Relation Age of Onset   Hypertension Mother    Valvular heart disease Father     Allergies  Allergen Reactions   Lisinopril Other (See Comments)    Severe urination Other reaction(s): Other (See Comments), Other (See Comments) Severe urination      REVIEW  OF SYSTEMS (Negative unless checked)  Constitutional: Weight loss  Fever  Chills Cardiac: Chest pain   Chest pressure   Palpitations   Shortness of breath when laying flat   Shortness of breath with exertion. Vascular:  Pain in legs with walking   Pain in legs at rest  History of DVT   Phlebitis   Swelling in legs   Varicose veins   Non-healing ulcers Pulmonary:   Uses home oxygen   Productive cough   Hemoptysis   Wheeze  COPD   Asthma Neurologic:  Dizziness   Seizures   History of stroke   History of TIA  Aphasia   Vissual changes   Weakness or numbness in arm   Weakness or numbness in leg Musculoskeletal:   Joint swelling   Joint pain   Low back pain Hematologic:  Easy bruising  Easy bleeding   Hypercoagulable state   Anemic Gastrointestinal:  Diarrhea   Vomiting  Gastroesophageal reflux/heartburn   Difficulty swallowing. Genitourinary:  Chronic kidney disease   Difficult urination  Frequent urination   Blood in urine Skin:  Rashes   Ulcers  Psychological:  History of anxiety    History of major depression.  Physical Examination  There were no vitals filed for this visit. There is no height or weight on file to calculate BMI. Gen: WD/WN, NAD Head: [place]/AT, No temporalis wasting.  Ear/Nose/Throat: Hearing grossly intact, nares w/o erythema or drainage Eyes: PER, EOMI, sclera nonicteric.  Neck: Supple, no masses.  No bruit or JVD.  Pulmonary:  Good air movement, no audible wheezing, no use of accessory muscles.  Cardiac: RRR, normal S1, S2, no Murmurs. Vascular:  mild trophic changes, no open wounds Vessel Right Left  Radial Palpable Palpable  PT Not Palpable Not Palpable  DP Not Palpable Not Palpable  Gastrointestinal: soft, non-distended. No guarding/no peritoneal signs.  Musculoskeletal: M/S 5/5 throughout.  No visible deformity.  Neurologic: CN 2-12 intact. Pain and light touch intact in  extremities.  Symmetrical.  Speech is fluent. Motor exam as listed above. Psychiatric: Judgment intact, Mood & affect appropriate for pt's clinical situation. Dermatologic: No rashes or ulcers noted.  No changes consistent with cellulitis.   CBC Lab Results  Component Value Date   WBC 5.6 08/17/2021   HGB 11.5 (L) 08/17/2021   HCT 35.0 (L) 08/17/2021   MCV 93.1 08/17/2021   PLT 140 (L) 08/17/2021    BMET    Component Value Date/Time   NA 137 08/17/2021 1229   K 4.0 08/17/2021 1229   CL 105 08/17/2021 1229   CO2 25 08/17/2021 1229   GLUCOSE 94 08/17/2021 1229   BUN 18 08/17/2021 1229   CREATININE 0.92 08/17/2021 1229   CALCIUM 9.3 08/17/2021 1229  GFRNONAA >60 08/17/2021 1229   GFRAA >60 11/27/2019 0545   CrCl cannot be calculated (Patient's most recent lab result is older than the maximum 21 days allowed.).  COAG Lab Results  Component Value Date   INR 2.0 (H) 11/19/2019   INR 1.7 (H) 11/15/2018    Radiology CT ANGIO ABDOMEN PELVIS  W &/OR WO CONTRAST  Result Date: 07/22/2022 CLINICAL DATA:  Abdominal aortic aneurysm status post endovascular aortic repair with known type 2 endoleak. Follow-up evaluation. EXAM: CTA ABDOMEN AND PELVIS WITHOUT AND WITH CONTRAST TECHNIQUE: Multidetector CT imaging of the abdomen and pelvis was performed using the standard protocol during bolus administration of intravenous contrast. Multiplanar reconstructed images and MIPs were obtained and reviewed to evaluate the vascular anatomy. RADIATION DOSE REDUCTION: This exam was performed according to the departmental dose-optimization program which includes automated exposure control, adjustment of the mA and/or kV according to patient size and/or use of iterative reconstruction technique. CONTRAST:  OMNIPAQUE IOHEXOL 350 MG/ML SOLN COMPARISON:  Prior CT arteriogram 12/27/2021 FINDINGS: VASCULAR Aorta: Fusiform abdominal aortic aneurysm status post endovascular aortic repair with bifurcated  endoprosthesis extending from just below the renal arteries into the bilateral common iliac arteries. Persistent mild contrast enhancement within the excluded aneurysm sac on arterial phase imaging with some blooming on delayed phase imaging consistent with known type 2 endoleak. The excluded aneurysm sac is stable at 5.8 cm. Celiac: Patent without evidence of aneurysm, dissection, vasculitis or significant stenosis. SMA: Patent without evidence of aneurysm, dissection, vasculitis or significant stenosis. Renals: Solitary right renal artery. Two left-sided renal arteries. Calcified plaque results in mild stenosis at the origin of the right renal artery. No changes of fibromuscular dysplasia. IMA: Patent and may be contributing to the type 2 endoleak. Inflow: Patent without evidence of aneurysm, dissection, vasculitis or significant stenosis. Proximal Outflow: Bilateral common femoral and visualized portions of the superficial and profunda femoral arteries are patent without evidence of aneurysm, dissection, vasculitis or significant stenosis. Veins: No focal venous abnormality. Review of the MIP images confirms the above findings. NON-VASCULAR Lower chest: Marked cardiomegaly. Evidence of prior mitral valve annuloplasty. No pericardial effusion. No acute abnormality. Hepatobiliary: No focal liver abnormality is seen. No gallstones, gallbladder wall thickening, or biliary dilatation. Pancreas: Unremarkable. No pancreatic ductal dilatation or surrounding inflammatory changes. Spleen: Normal in size without focal abnormality. Adrenals/Urinary Tract: Adrenal glands are unremarkable. Kidneys are normal, without renal calculi, focal lesion, or hydronephrosis. Bladder is unremarkable. Stomach/Bowel: Stomach is within normal limits. Appendix appears normal. No evidence of bowel wall thickening, distention, or inflammatory changes. Lymphatic: No suspicious lymphadenopathy. Reproductive: Prostate is unremarkable. Other: No  abdominal wall hernia or abnormality. No abdominopelvic ascites. Musculoskeletal: No acute or significant osseous findings. Multilevel degenerative disc disease. IMPRESSION: VASCULAR 1. Infrarenal abdominal aortic aneurysm status post endovascular aortic repair with persistent known type 2 endoleak. The excluded aneurysm sac remains stable in size at 5.8 cm. No significant interval growth compared to prior imaging from 12/27/2021. NON-VASCULAR 1. No acute abnormality. 2. Ancillary findings as above without significant interval change. Electronically Signed   By: Malachy Moan M.D.   On: 07/22/2022 08:19     Assessment/Plan There are no diagnoses linked to this encounter.   [name], MD  07/30/2022 11:02 AM

## 2022-07-30 NOTE — H&P (View-Only) (Signed)
MRN : [id number]  Leslie Prince is a 77 y.o. (08-11-1945) male who presents with chief complaint of check circulation.  History of Present Illness:   The patient is seen for follow up evaluation of AAA status post EVAR.    Procedure on 11/26/2019:  Placement of a 28 x 14 x 18 C3 Gore Excluder Endoprosthesis main body 16 x 14 contralateral limb and a 16 x 10 extender on the left   The patient denies interval development of abdominal or lower back pain. No new lower extremity pain or discoloration of the toes.    Patient is also followed for a small ascending thoracic aortic aneurysm.  He denies unusual chest pain or upper back pain.   I have personally reviewed the following scans.  CT scan dated 05/2020  AAA sac 4.9 2.    CT scan dated 12/2021  AAA sac 5.4 3.    CT scan dated 07/2022  AAA sac 5.6 Type II endoleak noted   Also noted is an ascending thoracic aortic aneurysm of 4.1 cm   Duplex ultrasound of the abdominal aorta demonstrates the stent is patent a probable type II endoleak is identified.  The sac now measures 5.56 cm  The patient denies recent episodes of angina or shortness of breath. The patient denies interval anaurosis fugax. There is no recent history of TIA symptoms or focal motor deficits. The patient denies PAD or claudication symptoms.   No outpatient medications have been marked as taking for the 07/31/22 encounter (Appointment) with Gilda Crease, Latina Craver, MD.    Past Medical History:  Diagnosis Date   Atrial fibrillation (HCC)    Cancer [facility])    prostate cancer   CHF (congestive heart failure) (HCC)    COPD (chronic obstructive pulmonary disease) (HCC)    Coronary artery disease    Depression    Hepatitis A infection    History of colon polyps    Hyperlipidemia    Hypertension    Mitral valve problem    Sleep apnea     Past Surgical History:  Procedure Laterality Date   CARDIAC SURGERY     CARDIAC VALVE REPLACEMENT      CARDIOVERSION  2013   CATARACT EXTRACTION W/PHACO Right 08/06/2019   Procedure: CATARACT EXTRACTION PHACO AND INTRAOCULAR LENS PLACEMENT (IOC) RIGHT 12.34  01:25.4  14.5%;  Surgeon: Lockie Mola, MD;  Location: [facility];  Service: Ophthalmology;  Laterality: Right;   CATARACT EXTRACTION W/PHACO Left 08/27/2019   Procedure: CATARACT EXTRACTION PHACO AND INTRAOCULAR LENS PLACEMENT (IOC) LEFT 7.92 01:16.9 10.3%;  Surgeon: Lockie Mola, MD;  Location: [facility];  Service: Ophthalmology;  Laterality: Left;  sleep apnea   COLONOSCOPY N/A 09/28/2021   Procedure: COLONOSCOPY;  Surgeon: Toledo, Boykin Nearing, MD;  Location: [facility] ENDOSCOPY;  Service: Gastroenterology;  Laterality: N/A;  Cannot move up   COLONOSCOPY WITH PROPOFOL N/A 11/07/2017   Procedure: COLONOSCOPY WITH PROPOFOL;  Surgeon: Scot Jun, MD;  Location: [facility] ENDOSCOPY;  Service: Endoscopy;  Laterality: N/A;   CORONARY ANGIOPLASTY WITH STENT PLACEMENT  2011   CORONARY ARTERY BYPASS GRAFT     during mitral valve repair   ENDOVASCULAR REPAIR/STENT GRAFT N/A 11/26/2019   Procedure: ENDOVASCULAR REPAIR/STENT GRAFT;  Surgeon: Renford Dills, MD;  Location: [facility] INVASIVE CV LAB;  Service: Cardiovascular;  Laterality: N/A;   EYE SURGERY     MITRAL VALVE REPAIR  1997  Social History Social History   Tobacco Use   Smoking status: Former    Packs/day: 1.00    Years: 25.00    Additional pack years: 0.00    Total pack years: 25.00    Types: Cigarettes    Quit date: 06/21/1995    Years since quitting: 27.1   Smokeless tobacco: Never  Vaping Use   Vaping Use: Never used  Substance Use Topics   Alcohol use: Not Currently    Comment: Rare   Drug use: Never    Family History Family History  Problem Relation Age of Onset   Hypertension Mother    Valvular heart disease Father     Allergies  Allergen Reactions   Lisinopril Other (See Comments)    Severe urination Other reaction(s): Other  (See Comments), Other (See Comments) Severe urination      REVIEW OF SYSTEMS (Negative unless checked)  Constitutional: [] Weight loss  [] Fever  [] Chills Cardiac: [] Chest pain   [] Chest pressure   [] Palpitations   [] Shortness of breath when laying flat   [] Shortness of breath with exertion. Vascular:  [x] Pain in legs with walking   [] Pain in legs at rest  [] History of DVT   [] Phlebitis   [] Swelling in legs   [] Varicose veins   [] Non-healing ulcers Pulmonary:   [] Uses home oxygen   [] Productive cough   [] Hemoptysis   [] Wheeze  [] COPD   [] Asthma Neurologic:  [] Dizziness   [] Seizures   [] History of stroke   [] History of TIA  [] Aphasia   [] Vissual changes   [] Weakness or numbness in arm   [] Weakness or numbness in leg Musculoskeletal:   [] Joint swelling   [] Joint pain   [] Low back pain Hematologic:  [] Easy bruising  [] Easy bleeding   [] Hypercoagulable state   [] Anemic Gastrointestinal:  [] Diarrhea   [] Vomiting  [] Gastroesophageal reflux/heartburn   [] Difficulty swallowing. Genitourinary:  [] Chronic kidney disease   [] Difficult urination  [] Frequent urination   [] Blood in urine Skin:  [] Rashes   [] Ulcers  Psychological:  [] History of anxiety   []  History of major depression.  Physical Examination  There were no vitals filed for this visit. There is no height or weight on file to calculate BMI. Gen: WD/WN, NAD Head: [place]/AT, No temporalis wasting.  Ear/Nose/Throat: Hearing grossly intact, nares w/o erythema or drainage Eyes: PER, EOMI, sclera nonicteric.  Neck: Supple, no masses.  No bruit or JVD.  Pulmonary:  Good air movement, no audible wheezing, no use of accessory muscles.  Cardiac: RRR, normal S1, S2, no Murmurs. Vascular:  mild trophic changes, no open wounds Vessel Right Left  Radial Palpable Palpable  Gastrointestinal: soft, non-distended. No guarding/no peritoneal signs.  Musculoskeletal: M/S 5/5 throughout.  No visible deformity.  Neurologic: CN 2-12 intact. Pain and light touch  intact in extremities.  Symmetrical.  Speech is fluent. Motor exam as listed above. Psychiatric: Judgment intact, Mood & affect appropriate for pt's clinical situation. Dermatologic: No rashes or ulcers noted.  No changes consistent with cellulitis.   CBC Lab Results  Component Value Date   WBC 5.6 08/17/2021   HGB 11.5 (L) 08/17/2021   HCT 35.0 (L) 08/17/2021   MCV 93.1 08/17/2021   PLT 140 (L) 08/17/2021    BMET    Component Value Date/Time   NA 137 08/17/2021 1229   K 4.0 08/17/2021 1229   CL 105 08/17/2021 1229   CO2 25 08/17/2021 1229   GLUCOSE 94 08/17/2021 1229   BUN 18 08/17/2021 1229   CREATININE 0.92 08/17/2021  1229   CALCIUM 9.3 08/17/2021 1229   GFRNONAA >60 08/17/2021 1229   GFRAA >60 11/27/2019 0545   CrCl cannot be calculated (Patient's most recent lab result is older than the maximum 21 days allowed.).  COAG Lab Results  Component Value Date   INR 2.0 (H) 11/19/2019   INR 1.7 (H) 11/15/2018    Radiology CT ANGIO ABDOMEN PELVIS  W &/OR WO CONTRAST  Result Date: 07/22/2022 CLINICAL DATA:  Abdominal aortic aneurysm status post endovascular aortic repair with known type 2 endoleak. Follow-up evaluation. EXAM: CTA ABDOMEN AND PELVIS WITHOUT AND WITH CONTRAST TECHNIQUE: Multidetector CT imaging of the abdomen and pelvis was performed using the standard protocol during bolus administration of intravenous contrast. Multiplanar reconstructed images and MIPs were obtained and reviewed to evaluate the vascular anatomy. RADIATION DOSE REDUCTION: This exam was performed according to the departmental dose-optimization program which includes automated exposure control, adjustment of the mA and/or kV according to patient size and/or use of iterative reconstruction technique. CONTRAST:  OMNIPAQUE IOHEXOL 350 MG/ML SOLN COMPARISON:  Prior CT arteriogram 12/27/2021 FINDINGS: VASCULAR Aorta: Fusiform abdominal aortic aneurysm status post endovascular aortic repair with  bifurcated endoprosthesis extending from just below the renal arteries into the bilateral common iliac arteries. Persistent mild contrast enhancement within the excluded aneurysm sac on arterial phase imaging with some blooming on delayed phase imaging consistent with known type 2 endoleak. The excluded aneurysm sac is stable at 5.8 cm. Celiac: Patent without evidence of aneurysm, dissection, vasculitis or significant stenosis. SMA: Patent without evidence of aneurysm, dissection, vasculitis or significant stenosis. Renals: Solitary right renal artery. Two left-sided renal arteries. Calcified plaque results in mild stenosis at the origin of the right renal artery. No changes of fibromuscular dysplasia. IMA: Patent and may be contributing to the type 2 endoleak. Inflow: Patent without evidence of aneurysm, dissection, vasculitis or significant stenosis. Proximal Outflow: Bilateral common femoral and visualized portions of the superficial and profunda femoral arteries are patent without evidence of aneurysm, dissection, vasculitis or significant stenosis. Veins: No focal venous abnormality. Review of the MIP images confirms the above findings. NON-VASCULAR Lower chest: Marked cardiomegaly. Evidence of prior mitral valve annuloplasty. No pericardial effusion. No acute abnormality. Hepatobiliary: No focal liver abnormality is seen. No gallstones, gallbladder wall thickening, or biliary dilatation. Pancreas: Unremarkable. No pancreatic ductal dilatation or surrounding inflammatory changes. Spleen: Normal in size without focal abnormality. Adrenals/Urinary Tract: Adrenal glands are unremarkable. Kidneys are normal, without renal calculi, focal lesion, or hydronephrosis. Bladder is unremarkable. Stomach/Bowel: Stomach is within normal limits. Appendix appears normal. No evidence of bowel wall thickening, distention, or inflammatory changes. Lymphatic: No suspicious lymphadenopathy. Reproductive: Prostate is unremarkable.  Other: No abdominal wall hernia or abnormality. No abdominopelvic ascites. Musculoskeletal: No acute or significant osseous findings. Multilevel degenerative disc disease. IMPRESSION: VASCULAR 1. Infrarenal abdominal aortic aneurysm status post endovascular aortic repair with persistent known type 2 endoleak. The excluded aneurysm sac remains stable in size at 5.8 cm. No significant interval growth compared to prior imaging from 12/27/2021. NON-VASCULAR 1. No acute abnormality. 2. Ancillary findings as above without significant interval change. Electronically Signed   By: Malachy Moan M.D.   On: 07/22/2022 08:19     Assessment/Plan 1. Infrarenal abdominal aortic aneurysm (AAA) without rupture Recommend:  And spite of successful stent graft placement the aneurysm sac is > 5 cm with a type II endoleak identified and therefore should undergo repair. Patient is status post CT scan of the abdominal aorta. The patient is a candidate  for intervention with coil embolization of the feeding vessels.  Review of the scan suggests lumbar arteries.  The patient will continue antiplatelet therapy as prescribed (since the patient is undergoing endovascular repair as opposed to open repair) as well as aggressive management of hyperlipidemia. Exercise is again strongly encouraged.   The patient is reminded that lifetime routine surveillance is a necessity with an endograft.   The risks and benefits of intervention for treatment of a type II endoleak with an expanding aneurysm sac are reviewed with the patient.  All questions are answered.  Alternative therapies are also discussed.  The patient agrees to proceed with endovascular aneurysm repair.  Patient will follow-up with me in the office after the surgery.  2. PAD (peripheral artery disease)  Recommend:  The patient has evidence of atherosclerosis of the lower extremities with claudication.  The patient does not voice lifestyle limiting changes at this  point in time.  Noninvasive studies do not suggest clinically significant change.  No invasive studies, angiography or surgery at this time The patient should continue walking and begin a more formal exercise program.  The patient should continue antiplatelet therapy and aggressive treatment of the lipid abnormalities  No changes in the patient's medications at this time  Continued surveillance is indicated as atherosclerosis is likely to progress with time.    The patient will continue follow up with noninvasive studies as ordered.   3. Coronary artery disease of native artery of native heart with stable angina pectoris Continue cardiac and antihypertensive medications as already ordered and reviewed, no changes at this time.  Continue statin as ordered and reviewed, no changes at this time  Nitrates PRN for chest pain  4. Permanent atrial fibrillation Continue antiarrhythmia medications as already ordered, these medications have been reviewed and there are no changes at this time.  Continue anticoagulation as ordered by Cardiology Service  5. Mixed hyperlipidemia Continue statin as ordered and reviewed, no changes at this time    [name], MD  07/30/2022 11:02 AM

## 2022-07-31 ENCOUNTER — Encounter (INDEPENDENT_AMBULATORY_CARE_PROVIDER_SITE_OTHER): Payer: Self-pay | Admitting: Vascular Surgery

## 2022-07-31 ENCOUNTER — Ambulatory Visit (INDEPENDENT_AMBULATORY_CARE_PROVIDER_SITE_OTHER): Payer: Medicare PPO | Admitting: Vascular Surgery

## 2022-07-31 VITALS — BP 124/73 | HR 65 | Resp 18 | Ht 69.0 in | Wt 272.4 lb

## 2022-07-31 DIAGNOSIS — I739 Peripheral vascular disease, unspecified: Secondary | ICD-10-CM | POA: Diagnosis not present

## 2022-07-31 DIAGNOSIS — I4821 Permanent atrial fibrillation: Secondary | ICD-10-CM | POA: Diagnosis not present

## 2022-07-31 DIAGNOSIS — I25118 Atherosclerotic heart disease of native coronary artery with other forms of angina pectoris: Secondary | ICD-10-CM | POA: Diagnosis not present

## 2022-07-31 DIAGNOSIS — I7143 Infrarenal abdominal aortic aneurysm, without rupture: Secondary | ICD-10-CM | POA: Diagnosis not present

## 2022-07-31 DIAGNOSIS — E782 Mixed hyperlipidemia: Secondary | ICD-10-CM

## 2022-08-02 ENCOUNTER — Encounter (INDEPENDENT_AMBULATORY_CARE_PROVIDER_SITE_OTHER): Payer: Self-pay | Admitting: Vascular Surgery

## 2022-08-04 ENCOUNTER — Encounter (INDEPENDENT_AMBULATORY_CARE_PROVIDER_SITE_OTHER): Payer: Self-pay | Admitting: Vascular Surgery

## 2022-08-21 ENCOUNTER — Telehealth (INDEPENDENT_AMBULATORY_CARE_PROVIDER_SITE_OTHER): Payer: Self-pay

## 2022-08-21 NOTE — Telephone Encounter (Addendum)
Spoke with the patient and he is scheduled with Dr. Gilda Crease on 08/2122 with a 11:00 am arrival time to the [facility] for a type II Endoleak angio. Pre-procedure instructions were discussed and patient stated he understood this will be sent to Mychart and mailed.

## 2022-08-21 NOTE — Telephone Encounter (Signed)
Authorization has been received and I have attempted to contact the patient to schedule him for a Endoleak II repair with Dr. Gilda Crease. A message was left for a return call.

## 2022-08-21 NOTE — Telephone Encounter (Signed)
Prior authorization has been requested.

## 2022-08-29 ENCOUNTER — Ambulatory Visit
Admission: RE | Admit: 2022-08-29 | Discharge: 2022-08-29 | Disposition: A | Discharge status: Home / Self Care | Payer: Medicare PPO | Attending: Vascular Surgery | Admitting: Vascular Surgery

## 2022-08-29 ENCOUNTER — Other Ambulatory Visit: Payer: Self-pay

## 2022-08-29 ENCOUNTER — Encounter
Admission: RE | Disposition: A | Discharge status: Home / Self Care | Payer: Self-pay | Source: Home / Self Care | Attending: Vascular Surgery

## 2022-08-29 ENCOUNTER — Encounter: Payer: Self-pay | Admitting: Vascular Surgery

## 2022-08-29 DIAGNOSIS — I25118 Atherosclerotic heart disease of native coronary artery with other forms of angina pectoris: Secondary | ICD-10-CM | POA: Insufficient documentation

## 2022-08-29 DIAGNOSIS — Z9889 Other specified postprocedural states: Secondary | ICD-10-CM

## 2022-08-29 DIAGNOSIS — I4821 Permanent atrial fibrillation: Secondary | ICD-10-CM | POA: Insufficient documentation

## 2022-08-29 DIAGNOSIS — T82330A Leakage of aortic (bifurcation) graft (replacement), initial encounter: Secondary | ICD-10-CM | POA: Diagnosis not present

## 2022-08-29 DIAGNOSIS — I509 Heart failure, unspecified: Secondary | ICD-10-CM | POA: Insufficient documentation

## 2022-08-29 DIAGNOSIS — I714 Abdominal aortic aneurysm, without rupture, unspecified: Secondary | ICD-10-CM | POA: Diagnosis not present

## 2022-08-29 DIAGNOSIS — I739 Peripheral vascular disease, unspecified: Secondary | ICD-10-CM | POA: Diagnosis not present

## 2022-08-29 DIAGNOSIS — I7143 Infrarenal abdominal aortic aneurysm, without rupture: Secondary | ICD-10-CM | POA: Diagnosis not present

## 2022-08-29 DIAGNOSIS — E782 Mixed hyperlipidemia: Secondary | ICD-10-CM | POA: Diagnosis not present

## 2022-08-29 DIAGNOSIS — I110 Hypertensive heart disease with heart failure: Secondary | ICD-10-CM | POA: Insufficient documentation

## 2022-08-29 DIAGNOSIS — Z87891 Personal history of nicotine dependence: Secondary | ICD-10-CM | POA: Diagnosis not present

## 2022-08-29 HISTORY — PX: LOWER EXTREMITY ANGIOGRAPHY: CATH118251

## 2022-08-29 LAB — CREATININE, SERUM
Creatinine, Ser: 0.84 mg/dL (ref 0.61–1.24)
GFR, Estimated: >60 mL/min (ref >60)

## 2022-08-29 LAB — BUN: BUN: 19 mg/dL (ref 8–23)

## 2022-08-29 SURGERY — LOWER EXTREMITY ANGIOGRAPHY
Anesthesia: Moderate Sedation

## 2022-08-29 MED ORDER — ACETAMINOPHEN 325 MG PO TABS
650.0000 mg | ORAL_TABLET | ORAL | Status: DC | PRN
  Administered 2022-08-29: 650 mg via ORAL

## 2022-08-29 MED ORDER — FENTANYL CITRATE (PF) 100 MCG/2ML IJ SOLN
INTRAMUSCULAR | Status: AC
  Filled 2022-08-29: qty 2

## 2022-08-29 MED ORDER — DIPHENHYDRAMINE HCL 50 MG/ML IJ SOLN: 50.0000 mg | Freq: Once | INTRAMUSCULAR | Status: DC | PRN

## 2022-08-29 MED ORDER — SODIUM CHLORIDE 0.9 % IV SOLN: INTRAVENOUS | Status: DC

## 2022-08-29 MED ORDER — MORPHINE SULFATE (PF) 4 MG/ML IV SOLN: 2.0000 mg | INTRAVENOUS | Status: DC | PRN

## 2022-08-29 MED ORDER — METHYLPREDNISOLONE SODIUM SUCC 125 MG IJ SOLR: 125.0000 mg | Freq: Once | INTRAMUSCULAR | Status: DC | PRN

## 2022-08-29 MED ORDER — HEPARIN SODIUM (PORCINE) 1000 UNIT/ML IJ SOLN
INTRAMUSCULAR | Status: DC | PRN
  Administered 2022-08-29: 4000 [IU] via INTRAVENOUS

## 2022-08-29 MED ORDER — FAMOTIDINE 20 MG PO TABS: 40.0000 mg | ORAL_TABLET | Freq: Once | ORAL | Status: DC | PRN

## 2022-08-29 MED ORDER — ONDANSETRON HCL 4 MG/2ML IJ SOLN: 4.0000 mg | Freq: Four times a day (QID) | INTRAMUSCULAR | Status: DC | PRN

## 2022-08-29 MED ORDER — HYDRALAZINE HCL 20 MG/ML IJ SOLN: 5.0000 mg | INTRAMUSCULAR | Status: DC | PRN

## 2022-08-29 MED ORDER — SODIUM CHLORIDE 0.9% FLUSH: 3.0000 mL | Freq: Two times a day (BID) | INTRAVENOUS | Status: DC

## 2022-08-29 MED ORDER — LABETALOL HCL 5 MG/ML IV SOLN: 10.0000 mg | INTRAVENOUS | Status: DC | PRN

## 2022-08-29 MED ORDER — FENTANYL CITRATE (PF) 100 MCG/2ML IJ SOLN
INTRAMUSCULAR | Status: DC | PRN
  Administered 2022-08-29: 12.5 ug via INTRAVENOUS
  Administered 2022-08-29: 50 ug via INTRAVENOUS
  Administered 2022-08-29: 25 ug via INTRAVENOUS

## 2022-08-29 MED ORDER — HYDROMORPHONE HCL 1 MG/ML IJ SOLN: 1.0000 mg | Freq: Once | INTRAMUSCULAR | Status: DC | PRN

## 2022-08-29 MED ORDER — CEFAZOLIN SODIUM-DEXTROSE 2-4 GM/100ML-% IV SOLN
2.0000 g | INTRAVENOUS | Status: AC
  Administered 2022-08-29: 2 g via INTRAVENOUS

## 2022-08-29 MED ORDER — SODIUM CHLORIDE 0.9% FLUSH: 3.0000 mL | INTRAVENOUS | Status: DC | PRN

## 2022-08-29 MED ORDER — MIDAZOLAM HCL 2 MG/2ML IJ SOLN
INTRAMUSCULAR | Status: DC | PRN
  Administered 2022-08-29: 1 mg via INTRAVENOUS
  Administered 2022-08-29: .5 mg via INTRAVENOUS
  Administered 2022-08-29: 2 mg via INTRAVENOUS

## 2022-08-29 MED ORDER — IODIXANOL 320 MG/ML IV SOLN
INTRAVENOUS | Status: DC | PRN
  Administered 2022-08-29: 75 mL via INTRA_ARTERIAL

## 2022-08-29 MED ORDER — ACETAMINOPHEN 325 MG PO TABS
ORAL_TABLET | ORAL | Status: AC
  Filled 2022-08-29: qty 2

## 2022-08-29 MED ORDER — MIDAZOLAM HCL 2 MG/ML PO SYRP: 8.0000 mg | ORAL_SOLUTION | Freq: Once | ORAL | Status: DC | PRN

## 2022-08-29 MED ORDER — SODIUM CHLORIDE 0.9 % IV SOLN: 250.0000 mL | INTRAVENOUS | Status: DC | PRN

## 2022-08-29 MED ORDER — HEPARIN SODIUM (PORCINE) 1000 UNIT/ML IJ SOLN
INTRAMUSCULAR | Status: AC
  Filled 2022-08-29: qty 10

## 2022-08-29 MED ORDER — MIDAZOLAM HCL 2 MG/2ML IJ SOLN
INTRAMUSCULAR | Status: AC
  Filled 2022-08-29: qty 4

## 2022-08-29 MED ORDER — OXYCODONE HCL 5 MG PO TABS: 5.0000 mg | ORAL_TABLET | ORAL | Status: DC | PRN

## 2022-08-29 SURGICAL SUPPLY — 28 items
CATH ANGIO 5F PIGTAIL 65CM (CATHETERS) IMPLANT
CATH BEACON 5 .035 40 KMP TP (CATHETERS) IMPLANT
CATH BEACON 5 .038 40 KMP TP (CATHETERS) ×1
CATH MICROCATH PRGRT 2.8F 110 (CATHETERS) IMPLANT
CATH TEMPO 5F RIM 65CM (CATHETERS) IMPLANT
COIL 400 COMPLEX SOFT 2X4CM (Vascular Products) IMPLANT
COIL 400 COMPLEX SOFT 3X5CM (Vascular Products) IMPLANT
COIL DETACH LP 3X10 (Embolic) IMPLANT
COVER PROBE ULTRASOUND 5X96 (MISCELLANEOUS) IMPLANT
DEVICE STARCLOSE SE CLOSURE (Vascular Products) IMPLANT
DEVICE TORQUE (MISCELLANEOUS) IMPLANT
GLIDEWIRE ADV .014X300CM (WIRE) IMPLANT
GLIDEWIRE ADV .035X180CM (WIRE) IMPLANT
GOWN STRL REUS W/ TWL LRG LVL3 (GOWN DISPOSABLE) ×1 IMPLANT
GOWN STRL REUS W/TWL LRG LVL3 (GOWN DISPOSABLE) ×1
GUIDEWIRE ADV .018X180CM (WIRE) IMPLANT
GUIDEWIRE AMPLATZ SHORT (WIRE) IMPLANT
GUIDEWIRE ANGLED .035 180CM (WIRE) IMPLANT
HANDLE DETACHMENT COIL (MISCELLANEOUS) IMPLANT
MICROCATH PROGREAT 2.8F 110 CM (CATHETERS) ×1
NDL ENTRY 21GA 7CM ECHOTIP (NEEDLE) IMPLANT
NEEDLE ENTRY 21GA 7CM ECHOTIP (NEEDLE) ×1 IMPLANT
PACK ANGIOGRAPHY (CUSTOM PROCEDURE TRAY) ×1 IMPLANT
SET INTRO CAPELLA COAXIAL (SET/KITS/TRAYS/PACK) IMPLANT
SHEATH BRITE TIP 5FRX11 (SHEATH) IMPLANT
SYR MEDRAD MARK 7 150ML (SYRINGE) IMPLANT
TUBING CONTRAST HIGH PRESS 72 (TUBING) IMPLANT
WIRE GUIDERIGHT .035X150 (WIRE) IMPLANT

## 2022-08-29 NOTE — Op Note (Signed)
[facility]  Percutaneous Study/Intervention Procedural Note      08/29/2022   Surgeon(s): [employer]   Assistants: none   Pre-operative Diagnosis:  Abdominal aortic aneurysm status post stent graft placement with a type II endoleak and expanding aneurysm sac.  Post-operative diagnosis:  Same   Procedure(s) Performed:             1.  Ultrasound guidance for vascular access bilateral common femoral arteries             2.  Catheter placement into bilateral hypogastric artery branches from ipsilateral femoral approach third order catheter placement             3.  Aortogram and selective angiogram of the bilateral hypogastric artery and its branches             4.  Coil embolization of the branches of the bilateral hypogastric artery feeding into the aneurysm sac using Ruby coils             5.  StarClose closure device bilateral femoral artery   Anesthesia: Continuous ECG pulse oximetry and cardiopulmonary monitors performed the entire procedure by the interventional radiology nurse total sedation time was 131 minutes              EBL: 50 cc   Fluoro Time: 36.1 minutes   Contrast: 75 cc              Indications:  Patient is a 76 y.o.male with aortoiliac aneurysmal disease that has been followed for a known type II endoleak.  Over the past several evaluations the aneurysm sac has continued to enlarge.  Coil embolization of the branches of the hypogastric arteries that are feeding into the endoleak is necessary to continued sac growth and potential rupture. The patient is brought in for angiography for further evaluation and potential treatment. Risks and benefits are discussed and informed consent is obtained   Procedure:  The patient was identified and appropriate procedural time out was performed.  The patient was then placed supine on the table and prepped and draped in the usual sterile fashion. Moderate conscious sedation was administered during a face  to face encounter with the patient throughout the procedure with my supervision of the RN administering medicines and monitoring the patient's vital signs, pulse oximetry, telemetry and mental status throughout from the start of the procedure until the patient was taken to the recovery room.    Ultrasound was used to evaluate the left common femoral artery.  It was patent .  A digital ultrasound image was acquired.  A micropuncture needle was used to access the left common femoral artery under direct ultrasound guidance and a permanent image was performed.  A microwire was then advanced without difficulty followed by microsheath, a 0.035 J wire was advanced without resistance and a 5Fr sheath was placed.  Pigtail catheter was placed into the aorta and an AP aortogram was performed. This demonstrated patent renal arteries with stent graft in good position faint type II endoleak was noted.   The patient was given 4000 units of intravenous heparin during the procedure.    A rim catheter was then used to select the left internal iliac artery.  Hand-injection contrast in multiple views were then used to determine which branch was feeding into the aneurysm sac.  And this was then selected using a prograde catheter.  The catheter was then negotiated as close to the aneurysm sac as I could get  to 3 mm x 5 cm Ruby soft coils were deployed occluding the branch.  Follow-up imaging demonstrated successful embolization.  Attempts at crossing from the left to the right through branches were not successful and I elected to then access the right common femoral artery.  The ultrasound was returned to the field in a sterile sleeve right common femoral was imaged it was echolucent and pulsatile indicating patency.  Lidocaine was infiltrated in soft tissues under ultrasound visualization and then under direct ultrasound visualization the microneedle was inserted followed by microwire and then a microsheath.  Image was recorded  for the permanent record.  J-wire was then advanced followed by placement of a 5 French sheath.  Similar to the left side the rim catheter was then used to select the right internal iliac and then using a combination of prograde 0.014 and advantage wires I selected the branch feeding into the sac hand-injection of contrast through the prograde verified this.  I then proceeded to embolize  this branch using two 2 mm x 4 cm soft coils followed by a 3 mm x 5 cm soft coil and then to 3 mm x 10 cm Ruby LP coils.  Follow-up imaging demonstrates absence of flow and there is no longer any visualization of the type II endoleak.  I elected to terminate the procedure. The diagnostic catheter was removed. StarClose closure device was deployed in usual fashion with excellent hemostatic result. The patient was taken to the recovery room in stable condition having tolerated the procedure well.         Findings:  Successful occlusion of the bilateral hypogastric artery branches that are directly filling the aneurysm sac eliminating the type II endoleak..   Disposition: Patient was taken to the recovery room in stable condition having tolerated the procedure well.   Complications:  None   [name], MD 08/29/2022 3:40 PM     This note was created with Dragon Medical transcription system. Any errors in dictation are purely unintentional.

## 2022-08-29 NOTE — Interval H&P Note (Signed)
History and Physical Interval Note:  08/29/2022 11:35 AM  Leslie Prince  has presented today for surgery, with the diagnosis of Endoleak II Repair Angio      AAA w/o rupture.  The various methods of treatment have been discussed with the patient and family. After consideration of risks, benefits and other options for treatment, the patient has consented to  Procedure(s): Lower Extremity Angiography (N/A) as a surgical intervention.  The patient's history has been reviewed, patient examined, no change in status, stable for surgery.  I have reviewed the patient's chart and labs.  Questions were answered to the patient's satisfaction.     [name]

## 2022-08-31 ENCOUNTER — Encounter: Payer: Self-pay | Admitting: Vascular Surgery

## 2022-09-08 ENCOUNTER — Other Ambulatory Visit: Payer: Self-pay

## 2022-09-08 DIAGNOSIS — I4821 Permanent atrial fibrillation: Secondary | ICD-10-CM

## 2022-09-08 MED ORDER — DABIGATRAN ETEXILATE MESYLATE 150 MG PO CAPS
150.0000 mg | ORAL_CAPSULE | Freq: Two times a day (BID) | ORAL | 0 refills | Status: DC
Start: 2022-09-08 — End: 2022-12-19

## 2022-09-26 ENCOUNTER — Other Ambulatory Visit (INDEPENDENT_AMBULATORY_CARE_PROVIDER_SITE_OTHER): Payer: Self-pay | Admitting: Vascular Surgery

## 2022-09-26 DIAGNOSIS — I714 Abdominal aortic aneurysm, without rupture, unspecified: Secondary | ICD-10-CM

## 2022-09-29 ENCOUNTER — Encounter (INDEPENDENT_AMBULATORY_CARE_PROVIDER_SITE_OTHER): Payer: Self-pay | Admitting: Nurse Practitioner

## 2022-09-29 ENCOUNTER — Ambulatory Visit (INDEPENDENT_AMBULATORY_CARE_PROVIDER_SITE_OTHER): Payer: Medicare PPO

## 2022-09-29 ENCOUNTER — Ambulatory Visit (INDEPENDENT_AMBULATORY_CARE_PROVIDER_SITE_OTHER): Payer: Medicare PPO | Admitting: Nurse Practitioner

## 2022-09-29 ENCOUNTER — Encounter (INDEPENDENT_AMBULATORY_CARE_PROVIDER_SITE_OTHER): Payer: Medicare PPO

## 2022-09-29 VITALS — BP 125/79 | HR 71 | Resp 18 | Ht 69.0 in | Wt 282.2 lb

## 2022-09-29 DIAGNOSIS — I7143 Infrarenal abdominal aortic aneurysm, without rupture: Secondary | ICD-10-CM

## 2022-09-29 DIAGNOSIS — I714 Abdominal aortic aneurysm, without rupture, unspecified: Secondary | ICD-10-CM | POA: Diagnosis not present

## 2022-09-29 DIAGNOSIS — E782 Mixed hyperlipidemia: Secondary | ICD-10-CM

## 2022-09-29 DIAGNOSIS — I10 Essential (primary) hypertension: Secondary | ICD-10-CM | POA: Diagnosis not present

## 2022-09-30 NOTE — Progress Notes (Signed)
Subjective:    Patient ID: Leslie Prince, male    DOB: [birth year], 77 y.o.   MRN: [id number] Chief Complaint  Patient presents with   Follow-up    3 week follow up after procedure with a duplex ultrasound of the aorta, EVAR    The patient returns to the office for followup and review status post angiogram with intervention on 08/29/2022 Procedure: Procedure(s) Performed:             1.  Ultrasound guidance for vascular access bilateral common femoral arteries             2.  Catheter placement into bilateral hypogastric artery branches from ipsilateral femoral approach third order catheter placement             3.  Aortogram and selective angiogram of the bilateral hypogastric artery and its branches             4.  Coil embolization of the branches of the bilateral hypogastric artery feeding into the aneurysm sac using Ruby coils             5.  StarClose closure device bilateral femoral artery   The patient notes no significant issues postintervention.  He denies claudication of his buttocks but that is due to limited mobility from other musculoskeletal issues.  There have been no significant changes to the patient's overall health care.  No documented history of amaurosis fugax or recent TIA symptoms. There are no recent neurological changes noted. No documented history of DVT, PE or superficial thrombophlebitis. The patient denies recent episodes of angina or shortness of breath.   Today noninvasive studies show no evidence of endoleak.  Largest diameter of aorta is 5.58 cm.  This is improved from previous studies which showed endoleak and the previous diameter was 5.56 cm.    Review of Systems  Musculoskeletal:  Positive for arthralgias.  All other systems reviewed and are negative.      Objective:   Physical Exam Vitals reviewed.  HENT:     Head: Normocephalic.  Cardiovascular:     Rate and Rhythm: Normal rate.  Pulmonary:     Effort: Pulmonary effort is normal.   Skin:    General: Skin is warm and dry.  Neurological:     Mental Status: He is alert and oriented to person, place, and time.  Psychiatric:        Mood and Affect: Mood normal.        Behavior: Behavior normal.        Thought Content: Thought content normal.        Judgment: Judgment normal.     BP 125/79 (BP Location: Left Arm)   Pulse 71   Resp 18   Ht 5\' 9"  (1.753 m)   Wt 282 lb 3.2 oz (128 kg)   BMI 41.67 kg/m   Past Medical History:  Diagnosis Date   Atrial fibrillation (HCC)    Cancer (HCC)    prostate cancer   CHF (congestive heart failure) (HCC)    COPD (chronic obstructive pulmonary disease) (HCC)    Coronary artery disease    Depression    Hepatitis A infection    History of colon polyps    Hyperlipidemia    Hypertension    Mitral valve problem    Sleep apnea     Social History   Socioeconomic History   Marital status: Married    Spouse name: Not on file   Number of children:  Not on file   Years of education: Not on file   Highest education level: Not on file  Occupational History   Not on file  Tobacco Use   Smoking status: Former    Packs/day: 1.00    Years: 25.00    Additional pack years: 0.00    Total pack years: 25.00    Types: Cigarettes    Quit date: 06/21/1995    Years since quitting: 27.2   Smokeless tobacco: Never  Vaping Use   Vaping Use: Never used  Substance and Sexual Activity   Alcohol use: Not Currently    Comment: Rare   Drug use: Never   Sexual activity: Yes    Birth control/protection: None  Other Topics Concern   Not on file  Social History Narrative   Lives with wife, Lanora Manis.  Pet, cat, in house.    Social Determinants of Health   Financial Resource Strain: Not on file  Food Insecurity: Not on file  Transportation Needs: Not on file  Physical Activity: Not on file  Stress: Not on file  Social Connections: Not on file  Intimate Partner Violence: Not on file    Past Surgical History:  Procedure  Laterality Date   CARDIAC SURGERY     CARDIAC VALVE REPLACEMENT     CARDIOVERSION  2013   CATARACT EXTRACTION W/PHACO Right 08/06/2019   Procedure: CATARACT EXTRACTION PHACO AND INTRAOCULAR LENS PLACEMENT (IOC) RIGHT 12.34  01:25.4  14.5%;  Surgeon: Lockie Mola, MD;  Location: [facility];  Service: Ophthalmology;  Laterality: Right;   CATARACT EXTRACTION W/PHACO Left 08/27/2019   Procedure: CATARACT EXTRACTION PHACO AND INTRAOCULAR LENS PLACEMENT (IOC) LEFT 7.92 01:16.9 10.3%;  Surgeon: Lockie Mola, MD;  Location: [facility];  Service: Ophthalmology;  Laterality: Left;  sleep apnea   COLONOSCOPY N/A 09/28/2021   Procedure: COLONOSCOPY;  Surgeon: Toledo, Boykin Nearing, MD;  Location: [facility] ENDOSCOPY;  Service: Gastroenterology;  Laterality: N/A;  Cannot move up   COLONOSCOPY WITH PROPOFOL N/A 11/07/2017   Procedure: COLONOSCOPY WITH PROPOFOL;  Surgeon: Scot Jun, MD;  Location: [facility] ENDOSCOPY;  Service: Endoscopy;  Laterality: N/A;   CORONARY ANGIOPLASTY WITH STENT PLACEMENT  2011   CORONARY ARTERY BYPASS GRAFT     during mitral valve repair   ENDOVASCULAR REPAIR/STENT GRAFT N/A 11/26/2019   Procedure: ENDOVASCULAR REPAIR/STENT GRAFT;  Surgeon: Renford Dills, MD;  Location: [facility] INVASIVE CV LAB;  Service: Cardiovascular;  Laterality: N/A;   EYE SURGERY     LOWER EXTREMITY ANGIOGRAPHY N/A 08/29/2022   Procedure: Lower Extremity Angiography;  Surgeon: Renford Dills, MD;  Location: [facility] INVASIVE CV LAB;  Service: Cardiovascular;  Laterality: N/A;   MITRAL VALVE REPAIR  1997    Family History  Problem Relation Age of Onset   Hypertension Mother    Valvular heart disease Father     Allergies  Allergen Reactions   Lisinopril Other (See Comments)    Severe urination Other reaction(s): Other (See Comments), Other (See Comments) Severe urination        Latest Ref Rng & Units 08/17/2021   12:29 PM 11/27/2019    5:45 AM 11/19/2019   10:41 AM   CBC  WBC 4.0 - 10.5 K/uL 5.6  11.8  5.7   Hemoglobin 13.0 - 17.0 g/dL 29.5  62.1  30.8   Hematocrit 39.0 - 52.0 % 35.0  32.9  35.1   Platelets 150 - 400 K/uL 140  125  129       CMP  Component Value Date/Time   NA 137 08/17/2021 1229   K 4.0 08/17/2021 1229   CL 105 08/17/2021 1229   CO2 25 08/17/2021 1229   GLUCOSE 94 08/17/2021 1229   BUN 19 08/29/2022 1121   CREATININE 0.84 08/29/2022 1121   CALCIUM 9.3 08/17/2021 1229   PROT 6.7 11/15/2018 0723   ALBUMIN 3.6 11/15/2018 0723   AST 22 11/15/2018 0723   ALT 15 11/15/2018 0723   ALKPHOS 35 (L) 11/15/2018 0723   BILITOT 2.1 (H) 11/15/2018 0723   GFRNONAA >60 08/29/2022 1121     No results found.     Assessment & Plan:   1. Infrarenal abdominal aortic aneurysm (AAA) without rupture (HCC) Recommend:  Patient is status post successful endovascular repair of the AAA.   No further intervention is required at this time.   No endoleak is detected and the aneurysm sac is stable.  The patient will continue antiplatelet therapy as prescribed as well as aggressive management of hyperlipidemia. Exercise is again strongly encouraged.   However, endografts require continued surveillance with ultrasound or CT scan. This is mandatory to detect any changes that allow repressurization of the aneurysm sac.  The patient is informed that this would be asymptomatic.  The patient is reminded that lifelong routine surveillance is a necessity with an endograft. Patient will continue to follow-up at the specified interval with ultrasound of the aorta.  2. Mixed hyperlipidemia Continue statin as ordered and reviewed, no changes at this time  3. Primary hypertension Continue antihypertensive medications as already ordered, these medications have been reviewed and there are no changes at this time.   Current Outpatient Medications on File Prior to Visit  Medication Sig Dispense Refill   albuterol (PROVENTIL) (2.5 MG/3ML) 0.083%  nebulizer solution Take 2.5 mg by nebulization every 6 (six) hours as needed for wheezing.     albuterol (VENTOLIN HFA) 108 (90 Base) MCG/ACT inhaler Inhale 2 puffs into the lungs every 6 (six) hours as needed for wheezing.      allopurinol (ZYLOPRIM) 100 MG tablet Take 100 mg by mouth daily.     atorvastatin (LIPITOR) 20 MG tablet Take 20 mg by mouth at bedtime.   1   calcium carbonate (TUMS - DOSED IN MG ELEMENTAL CALCIUM) 500 MG chewable tablet Chew 1 tablet by mouth 2 (two) times daily.     colchicine 0.6 MG tablet Take 0.6 mg by mouth as directed. Take 2 tablets at onset of gout flare, then 1 tablet one hour later     CRANBERRY EXTRACT PO Take by mouth.     dabigatran (PRADAXA) 150 MG CAPS capsule Take 1 capsule (150 mg total) by mouth 2 (two) times daily. 180 capsule 0   enalapril (VASOTEC) 20 MG tablet Take 1 tablet (20 mg total) by mouth 2 (two) times daily. 180 tablet 2   fluticasone (FLONASE) 50 MCG/ACT nasal spray Place into both nostrils daily.     Fluticasone-Salmeterol (ADVAIR) 250-50 MCG/DOSE AEPB Inhale 1 puff into the lungs 2 (two) times daily.      furosemide (LASIX) 40 MG tablet Take 40 mg by mouth daily.      KLOR-CON M10 10 MEQ tablet Take 2 tablets (20 mEq total) by mouth 2 (two) times daily. 180 tablet 3   metoprolol succinate (TOPROL-XL) 100 MG 24 hr tablet Take 100 mg by mouth daily.     Misc Natural Products (TART CHERRY ADVANCED PO) Take by mouth daily.      montelukast (SINGULAIR) 10 MG  tablet Take 10 mg by mouth at bedtime.   3   sildenafil (VIAGRA) 100 MG tablet Take 1 tablet (100 mg total) by mouth daily as needed for erectile dysfunction. 15 tablet 11   tamsulosin (FLOMAX) 0.4 MG CAPS capsule Take 1 capsule (0.4 mg total) by mouth daily after supper. 90 capsule 3   tiotropium (SPIRIVA) 18 MCG inhalation capsule Place 18 mcg into inhaler and inhale daily.     No current facility-administered medications on file prior to visit.    There are no Patient Instructions  on file for this visit. No follow-ups on file.   Georgiana Spinner, NP

## 2022-10-13 DIAGNOSIS — C61 Malignant neoplasm of prostate: Secondary | ICD-10-CM | POA: Insufficient documentation

## 2022-11-12 ENCOUNTER — Encounter: Payer: Self-pay | Admitting: Cardiology

## 2022-11-14 MED ORDER — KLOR-CON M10 10 MEQ PO TBCR: 20.0000 meq | EXTENDED_RELEASE_TABLET | Freq: Two times a day (BID) | ORAL | 1 refills | Status: DC

## 2022-11-29 ENCOUNTER — Ambulatory Visit: Payer: Medicare PPO | Attending: Cardiology | Admitting: Cardiology

## 2022-11-29 ENCOUNTER — Encounter: Payer: Self-pay | Admitting: Cardiology

## 2022-11-29 ENCOUNTER — Other Ambulatory Visit: Payer: Self-pay

## 2022-11-29 VITALS — BP 130/74 | HR 71 | Ht 69.0 in | Wt 276.2 lb

## 2022-11-29 DIAGNOSIS — I4821 Permanent atrial fibrillation: Secondary | ICD-10-CM

## 2022-11-29 DIAGNOSIS — I10 Essential (primary) hypertension: Secondary | ICD-10-CM

## 2022-11-29 DIAGNOSIS — Z951 Presence of aortocoronary bypass graft: Secondary | ICD-10-CM | POA: Diagnosis not present

## 2022-11-29 DIAGNOSIS — I77810 Thoracic aortic ectasia: Secondary | ICD-10-CM

## 2022-11-29 DIAGNOSIS — I5030 Unspecified diastolic (congestive) heart failure: Secondary | ICD-10-CM

## 2022-11-29 MED ORDER — FUROSEMIDE 40 MG PO TABS
40.0000 mg | ORAL_TABLET | Freq: Every day | ORAL | 0 refills | Status: DC
  Filled 2022-11-29: qty 90, 90d supply, fill #0

## 2022-11-29 MED ORDER — TORSEMIDE 20 MG PO TABS: 40.0000 mg | ORAL_TABLET | Freq: Every day | ORAL | 3 refills | Status: DC

## 2022-11-29 MED ORDER — MOUNJARO 2.5 MG/0.5ML ~~LOC~~ SOAJ
2.5000 mg | SUBCUTANEOUS | 0 refills | Status: DC
Start: 2022-11-29 — End: 2023-01-23
  Filled 2022-11-29 (×2): qty 2, 28d supply, fill #0

## 2022-11-29 MED ORDER — MOUNJARO 5 MG/0.5ML ~~LOC~~ SOAJ
5.0000 mg | SUBCUTANEOUS | 0 refills | Status: DC
  Filled 2022-11-29: qty 2, 28d supply, fill #0

## 2022-11-29 NOTE — Progress Notes (Signed)
Cardiology Office Note:    Date:  11/29/2022   ID:  QUANTEL DRISKEL, DOB [birth year], MRN [id number]  PCP:  Dorothey Baseman, MD  [facility] Cardiologist:  Debbe Odea, MD  [facility] Electrophysiologist:  None   Referring MD: Dorothey Baseman, MD   Chief Complaint  Patient presents with   Follow-up    Patient reports worsening dyspnea on exertion.      History of Present Illness:    Leslie Prince is a 77 y.o. male with a hx of chronic atrial fibrillation on Pradaxa, CAD PCI w/ DES to LCx 2011, CABG x 3 in 1997, mitral valve repair 1997, AAA s/p endovascular stent repair 11/2019, hypertension, former smoker, COPD who presents due to worsening shortness of breath.  Has noticed worsening shortness of breath over the past month or so.  Endorses leg edema.  Takes Lasix 40 mg daily as prescribed, some days he does not have adequate urination.  Recently prescribed Mounjaro but medication not available in pharmacy.  Denies chest pain.  Compliant with meds as prescribed.  Prior notes Echo 08/2021 EF 55 to 60%, normal functioning mitral valve prosthesis/ring. Patient had a recent abdominal CT showing abdominal aortic aneurysm up to 5 cm.  Endovascular repair by vascular surgery was performed on 11/27/2019.   Echocardiogram 11/2019 showed low normal ejection fraction, EF 50 to 55%.  Mild ascending aorta dilatation, 42 mm.   Past Medical History:  Diagnosis Date   Atrial fibrillation (HCC)    Cancer [facility])    prostate cancer   CHF (congestive heart failure) (HCC)    COPD (chronic obstructive pulmonary disease) (HCC)    Coronary artery disease    Depression    Hepatitis A infection    History of colon polyps    Hyperlipidemia    Hypertension    Mitral valve problem    Sleep apnea     Past Surgical History:  Procedure Laterality Date   CARDIAC SURGERY     CARDIAC VALVE REPLACEMENT     CARDIOVERSION  2013   CATARACT EXTRACTION W/PHACO Right 08/06/2019   Procedure:  CATARACT EXTRACTION PHACO AND INTRAOCULAR LENS PLACEMENT (IOC) RIGHT 12.34  01:25.4  14.5%;  Surgeon: Lockie Mola, MD;  [facility];  Service: Ophthalmology;  Laterality: Right;   CATARACT EXTRACTION W/PHACO Left 08/27/2019   Procedure: CATARACT EXTRACTION PHACO AND INTRAOCULAR LENS PLACEMENT (IOC) LEFT 7.92 01:16.9 10.3%;  Surgeon: Lockie Mola, MD;  Location: [facility];  Service: Ophthalmology;  Laterality: Left;  sleep apnea   COLONOSCOPY N/A 09/28/2021   Procedure: COLONOSCOPY;  Surgeon: Toledo, Boykin Nearing, MD;  Location: [facility] ENDOSCOPY;  Service: Gastroenterology;  Laterality: N/A;  Cannot move up   COLONOSCOPY WITH PROPOFOL N/A 11/07/2017   Procedure: COLONOSCOPY WITH PROPOFOL;  Surgeon: Scot Jun, MD;  Location: [facility] ENDOSCOPY;  Service: Endoscopy;  Laterality: N/A;   CORONARY ANGIOPLASTY WITH STENT PLACEMENT  2011   CORONARY ARTERY BYPASS GRAFT     during mitral valve repair   ENDOVASCULAR REPAIR/STENT GRAFT N/A 11/26/2019   Procedure: ENDOVASCULAR REPAIR/STENT GRAFT;  Surgeon: Renford Dills, MD;  Location: [facility] INVAS[facility] CV LAB;  Service: Cardiovascular;  Laterality: N/A;   EYE SURGERY     LOWER EXTREMITY ANGIOGRAPHY N/A 08/29/2022   Procedure: Lower Extremity Angiography;  Surgeon: Renford Dills, MD;  Location: [facility] IN[facility] CV LAB;  Service: Cardiovascular;  Laterality: N/A;   MITRAL VALVE REPAIR  1997    Current Medications: Current Meds  Medication Sig  albuterol (PROVENTIL) (2.5 MG/3ML) 0.083% nebulizer solution Take 2.5 mg by nebulization every 6 (six) hours as needed for wheezing.   albuterol (VENTOLIN HFA) 108 (90 Base) MCG/ACT inhaler Inhale 2 puffs into the lungs every 6 (six) hours as needed for wheezing.    allopurinol (ZYLOPRIM) 100 MG tablet Take 100 mg by mouth daily.   atorvastatin (LIPITOR) 20 MG tablet Take 20 mg by mouth at bedtime.    Calcium Carb-Cholecalciferol (OYSTER SHELL CALCIUM W/D) 500-5 MG-MCG  TABS Take 1 tablet by mouth daily.   calcium carbonate (TUMS - DOSED IN MG ELEMENTAL CALCIUM) 500 MG chewable tablet Chew 1 tablet by mouth 2 (two) times daily.   colchicine 0.6 MG tablet Take 0.6 mg by mouth as directed. Take 2 tablets at onset of gout flare, then 1 tablet one hour later   CRANBERRY EXTRACT PO Take by mouth.   dabigatran (PRADAXA) 150 MG CAPS capsule Take 1 capsule (150 mg total) by mouth 2 (two) times daily.   enalapril (VASOTEC) 20 MG tablet Take 1 tablet (20 mg total) by mouth 2 (two) times daily.   fluticasone (FLONASE) 50 MCG/ACT nasal spray Place into both nostrils daily.   Fluticasone-Salmeterol (ADVAIR) 250-50 MCG/DOSE AEPB Inhale 1 puff into the lungs 2 (two) times daily.    metoprolol succinate (TOPROL-XL) 100 MG 24 hr tablet Take 100 mg by mouth daily.   Misc Natural Products (TART CHERRY ADVANCED PO) Take by mouth daily.    montelukast (SINGULAIR) 10 MG tablet Take 10 mg by mouth at bedtime.    potassium chloride (KLOR-CON M) 10 MEQ tablet Take 10 mEq by mouth 2 (two) times daily.   sildenafil (VIAGRA) 100 MG tablet Take 1 tablet (100 mg total) by mouth daily as needed for erectile dysfunction.   tamsulosin (FLOMAX) 0.4 MG CAPS capsule Take 1 capsule (0.4 mg total) by mouth daily after supper.   tiotropium (SPIRIVA) 18 MCG inhalation capsule Place 18 mcg into inhaler and inhale daily.   tirzepatide [facility]) 2.5 MG/0.5ML Pen Inject 2.5 mg into the skin once a week. For 4 weeks   tirzepatide [facility]) 5 MG/0.5ML Pen Inject 5 mg into the skin once a week. For four weeks   torsemide (DEMADEX) 20 MG tablet Take 2 tablets (40 mg total) by mouth daily.   [DISCONTINUED] furosemide (LASIX) 40 MG tablet Take 40 mg by mouth daily.    [DISCONTINUED] furosemide (LASIX) 40 MG tablet Take 1 tablet (40 mg total) by mouth daily.     Allergies:   Lisinopril   Social History   Socioeconomic History   Marital status: Married    Spouse name: Not on file   Number of children:  Not on file   Years of education: Not on file   Highest education level: Not on file  Occupational History   Not on file  Tobacco Use   Smoking status: Former    Current packs/day: 0.00    Average packs/day: 1 pack/day for 25.0 years (25.0 ttl pk-yrs)    Types: Cigarettes    Start date: 06/21/1970    Quit date: 06/21/1995    Years since quitting: 27.4   Smokeless tobacco: Never  Vaping Use   Vaping status: Never Used  Substance and Sexual Activity   Alcohol use: Not Currently    Comment: Rare   Drug use: Never   Sexual activity: Yes    Birth control/protection: None  Other Topics Concern   Not on file  Social History Narrative   Lives  with wife, Lanora Manis.  Pet, cat, in house.    Social Determinants of Health   Financial Resource Strain: Low Risk  (11/28/2022)   Received from [facility]   Overall Financial Resource Strain (CARDIA)    Difficulty of Paying Living Expenses: Not hard at all  Food Insecurity: No Food Insecurity (11/28/2022)   Received from [facility]   Hunger Vital Sign    Worried About Running Out of Food in the Last Year: Never true    Ran Out of Food in the Last Year: Never true  Transportation Needs: No Transportation Needs (11/28/2022)   Received from [facility] - Transportation    In the past 12 months, has lack of transportation kept you from medical appointments or from getting medications?: No    Lack of Transportation (Non-Medical): No  Physical Activity: Not on file  Stress: Not on file  Social Connections: Not on file     Family History: The patient's family history includes Hypertension in his mother; Valvular heart disease in his father.  ROS:   Please see the history of present illness.     All other systems reviewed and are negative.  EKGs/Labs/Other Studies Reviewed:    The following studies were reviewed today:  EKG Interpretation Date/Time:  Wednesday November 29 2022  09:07:15 EDT Ventricular Rate:  71 PR Interval:    QRS Duration:  102 QT Interval:  454 QTC Calculation: 493 R Axis:   -8  Text Interpretation: Atrial fibrillation with premature ventricular or aberrantly conducted complexes Nonspecific ST abnormality Prolonged QT Confirmed by Debbe Odea ([id number]) on 11/29/2022 9:11:52 AM    Recent Labs: 08/29/2022: BUN 19; Creatinine, Ser 0.84  Recent Lipid Panel No results found for: "CHOL", "TRIG", "HDL", "CHOLHDL", "VLDL", "LDLCALC", "LDLDIRECT"  Physical Exam:    VS:  BP 130/74 (BP Location: Left Arm, Patient Position: Sitting, Cuff Size: Large)   Pulse 71   Ht 5\' 9"  (1.753 m)   Wt 276 lb 3.2 oz (125.3 kg)   SpO2 94%   BMI 40.79 kg/m     Wt Readings from Last 3 Encounters:  11/29/22 276 lb 3.2 oz (125.3 kg)  09/29/22 282 lb 3.2 oz (128 kg)  08/29/22 270 lb (122.5 kg)     GEN:  Well nourished, well developed in no acute distress HEENT: Normal NECK: No JVD; No carotid bruits CARDIAC: Irregular irregular, no murmurs, rubs, gallops RESPIRATORY: Diminished breath sounds at bases ABDOMEN: Soft, non-tender, non-distended MUSCULOSKELETAL:  2+ edema; No deformity  SKIN: Warm and dry NEUROLOGIC:  Alert and oriented x 3 PSYCHIATRIC:  Normal affect   ASSESSMENT:    1. Heart failure with preserved ejection fraction, unspecified HF chronicity (HCC)   2. Hx of CABG   3. Permanent atrial fibrillation (HCC)   4. Primary hypertension   5. Ascending aorta dilation (HCC)   6. Morbid obesity (HCC)    PLAN:    In order of problems listed above:  HFpEF, 2+ edema, abdominal distention.  Stop Lasix, start torsemide 40 mg daily.  Check BMP in 10 days.  Repeat echocardiogram.  Morbid obesity, COPD also contributing to shortness of breath. CAD/CABG x 3 1997, PCI to the left circumflex.  Denies chest pain. Cont statin, metoprolol Pradaxa.  permanent atrial fibrillation, heart rate controlled.  Continue Toprol-XL and Pradaxa.   hypertension, BP   controlled   Continue Toprol 100 mg daily, enalapril 20 mg twice daily.   Mild Ascending aortic  dilation, 4.1cm.  cont serial monitoring with CT scans obtained for lung cancer screening. Morbid obesity, agree with Mounjaro to help with weight loss.  Refill sent to [facility] local pharmacy.  Follow-up in 4 to 6 weeks.  Total encounter 40 minutes  Greater than 50% was spent in counseling and coordination of care with the patient  Medication Adjustments/Labs and Tests Ordered: Current medicines are reviewed at length with the patient today.  Concerns regarding medicines are outlined above.  Orders Placed This Encounter  Procedures   Basic Metabolic Panel (BMET)   EKG 12-Lead   ECHOCARDIOGRAM COMPLETE    Meds ordered this encounter  Medications   DISCONTD: furosemide (LASIX) 40 MG tablet    Sig: Take 1 tablet (40 mg total) by mouth daily.    Dispense:  90 tablet    Refill:  0   tirzepatide (MOUNJARO) 2.5 MG/0.5ML Pen    Sig: Inject 2.5 mg into the skin once a week. For 4 weeks    Dispense:  2 mL    Refill:  0   tirzepatide (MOUNJARO) 5 MG/0.5ML Pen    Sig: Inject 5 mg into the skin once a week. For four weeks    Dispense:  2 mL    Refill:  0   torsemide (DEMADEX) 20 MG tablet    Sig: Take 2 tablets (40 mg total) by mouth daily.    Dispense:  60 tablet    Refill:  3     Patient Instructions  Medication Instructions:   STOP Lasix START Torsemide - Take one tabelt ( 40 mg) by mouth daily.  START taking Tirzepatide (Mounjaro)  - 2.5 MG once a week for 4 weeks,  - 5 MG once a week for 4 weeks. (Call us when you have 2 weeks left at the 5 MG dose so we can send in the next dose).  - 7.5mg  once a week for 4 weeks - 10 mg once a week for 4 weeks (call us when you have 2 weeks left at the 10 mg dose so we can send in the next dose) - 12.5 mg once a week for 4 weeks - 15 mg once a week   *If you need a refill on your cardiac medications before your next appointment, please call your  pharmacy*   Lab Work:  Your provider would like for you to return in 10 days (around the first week of September) to have the following labs drawn: BMP.   Please go to [facility] [address] ([facility] [address], [place] [address] You do not need an appointment.  They are open from 7:30 am-4 pm.  Lunch from 1:00 pm- 2:00 pm You WILL NOT need to be fasting.     If you have labs (blood work) drawn today and your tests are completely normal, you will receive your results only by: MyChart Message (if you have MyChart) OR A paper copy in the mail If you have any lab test that is abnormal or we need to change your treatment, we will call you to review the results.   Testing/Procedures:  Your physician has requested that you have an echocardiogram. Echocardiography is a painless test that uses sound waves to create images of your heart. It provides your doctor with information about the size and shape of your heart and how well your heart's chambers and valves are working. This procedure takes approximately one hour. There are no restrictions for this procedure. Please do NOT wear  cologne, perfume, aftershave, or lotions (deodorant is allowed). Please arrive 15 minutes prior to your appointment time.    Follow-Up: At [facility], you and your health needs are our priority.  As part of our continuing mission to provide you with exceptional heart care, we have created designated Provider Care Teams.  These Care Teams include your primary Cardiologist (physician) and Advanced Practice Providers (APPs -  Physician Assistants and Nurse Practitioners) who all work together to provide you with the care you need, when you need it.  We recommend signing up for the patient portal called "MyChart".  Sign up information is provided on this After Visit Summary.  MyChart is used to connect with patients for Virtual Visits (Telemedicine).  Patients are able to view  lab/test results, encounter notes, upcoming appointments, etc.  Non-urgent messages can be sent to your provider as well.   To learn more about what you can do with MyChart, go to [email or web address].    Your next appointment:   4 - 6 week(s)  Provider:   You may see Debbe Odea, MD ONLY    Signed, Debbe Odea, MD  11/29/2022 10:30 AM    [facility]

## 2022-11-29 NOTE — Patient Instructions (Addendum)
Medication Instructions:   STOP Lasix START Torsemide - Take one tabelt ( 40 mg) by mouth daily.  START taking Tirzepatide (Mounjaro)  - 2.5 MG once a week for 4 weeks,  - 5 MG once a week for 4 weeks. (Call us when you have 2 weeks left at the 5 MG dose so we can send in the next dose).  - 7.5mg  once a week for 4 weeks - 10 mg once a week for 4 weeks (call us when you have 2 weeks left at the 10 mg dose so we can send in the next dose) - 12.5 mg once a week for 4 weeks - 15 mg once a week   *If you need a refill on your cardiac medications before your next appointment, please call your pharmacy*   Lab Work:  Your provider would like for you to return in 10 days (around the first week of September) to have the following labs drawn: BMP.   Please go to [facility] [address] ([facility] [address] [facility] [address], [place] [address] You do not need an appointment.  They are open from 7:30 am-4 pm.  Lunch from 1:00 pm- 2:00 pm You WILL NOT need to be fasting.     If you have labs (blood work) drawn today and your tests are completely normal, you will receive your results only by: MyChart Message (if you have MyChart) OR A paper copy in the mail If you have any lab test that is abnormal or we need to change your treatment, we will call you to review the results.   Testing/Procedures:  Your physician has requested that you have an echocardiogram. Echocardiography is a painless test that uses sound waves to create images of your heart. It provides your doctor with information about the size and shape of your heart and how well your heart's chambers and valves are working. This procedure takes approximately one hour. There are no restrictions for this procedure. Please do NOT wear cologne, perfume, aftershave, or lotions (deodorant is allowed). Please arrive 15 minutes prior to your appointment time.    Follow-Up: At [facility], you and your health needs are  our priority.  As part of our continuing mission to provide you with exceptional heart care, we have created designated Provider Care Teams.  These Care Teams include your primary Cardiologist (physician) and Advanced Practice Providers (APPs -  Physician Assistants and Nurse Practitioners) who all work together to provide you with the care you need, when you need it.  We recommend signing up for the patient portal called "MyChart".  Sign up information is provided on this After Visit Summary.  MyChart is used to connect with patients for Virtual Visits (Telemedicine).  Patients are able to view lab/test results, encounter notes, upcoming appointments, etc.  Non-urgent messages can be sent to your provider as well.   To learn more about what you can do with MyChart, go to [email or web address].    Your next appointment:   4 - 6 week(s)  Provider:   You may see Debbe Odea, MD ONLY

## 2022-11-30 ENCOUNTER — Other Ambulatory Visit: Payer: Self-pay

## 2022-12-06 ENCOUNTER — Other Ambulatory Visit: Payer: Self-pay

## 2022-12-08 ENCOUNTER — Other Ambulatory Visit: Payer: Self-pay

## 2022-12-08 MED ORDER — ENALAPRIL MALEATE 20 MG PO TABS: 20.0000 mg | ORAL_TABLET | Freq: Two times a day (BID) | ORAL | 0 refills | Status: DC

## 2022-12-08 NOTE — Telephone Encounter (Signed)
Requested Prescriptions   Signed Prescriptions Disp Refills   enalapril (VASOTEC) 20 MG tablet 180 tablet 0    Sig: Take 1 tablet (20 mg total) by mouth 2 (two) times daily.    Authorizing Provider: Debbe Odea    Ordering User: Guerry Minors

## 2022-12-14 LAB — BASIC METABOLIC PANEL
BUN/Creatinine Ratio: 21 (ref 10–24)
BUN: 21 mg/dL (ref 8–27)
CO2: 27 mmol/L (ref 20–29)
Calcium: 9.2 mg/dL (ref 8.6–10.2)
Chloride: 103 mmol/L (ref 96–106)
Creatinine, Ser: 1.02 mg/dL (ref 0.76–1.27)
Glucose: 86 mg/dL (ref 70–99)
Potassium: 4 mmol/L (ref 3.5–5.2)
Sodium: 144 mmol/L (ref 134–144)
eGFR: 75 mL/min/{1.73_m2} (ref >59)

## 2022-12-19 ENCOUNTER — Other Ambulatory Visit: Payer: Self-pay

## 2022-12-19 DIAGNOSIS — I4821 Permanent atrial fibrillation: Secondary | ICD-10-CM

## 2022-12-19 MED ORDER — DABIGATRAN ETEXILATE MESYLATE 150 MG PO CAPS: 150.0000 mg | ORAL_CAPSULE | Freq: Two times a day (BID) | ORAL | 1 refills | Status: DC

## 2022-12-19 NOTE — Telephone Encounter (Signed)
Prescription refill request for Pradaxa received.  Indication:afib Last office visit:8/24 Weight:125.3  kg Age:77 Scr:1.02  9/24 CrCl:104.08  ml/min  Prescription refilled

## 2022-12-26 ENCOUNTER — Ambulatory Visit: Payer: Medicare PPO | Attending: Cardiovascular Disease

## 2022-12-26 DIAGNOSIS — I5030 Unspecified diastolic (congestive) heart failure: Secondary | ICD-10-CM

## 2022-12-26 LAB — ECHOCARDIOGRAM COMPLETE
Area-P 1/2: 3.12 cm2
S' Lateral: 4.1 cm

## 2023-01-01 NOTE — Progress Notes (Unsigned)
Cardiology Office Note    Date:  01/04/2023   ID:  DERIAN BOZELL, DOB November 01, 1945, MRN [id number]  PCP:  Dorothey Baseman, MD  Cardiologist:  Debbe Odea, MD  Electrophysiologist:  None   Chief Complaint:  Chief Complaint  Patient presents with   4-6 week follow up     Patient c/o feeling exhausted most days with over exertion, shortness of breath & some mild LE edema. Medications reviewed by the patient verbally.     History of Present Illness:   ALEXANDRA MOSQUEDA is a 77 y.o. male with a hx of chronic atrial fibrillation on Pradaxa, CAD PCI w/ DES to LCx 2011, CABG x 3 in 1997,  mitral valve repair 1997,  AAA s/p endovascular stent repair 11/2019,  hypertension,  former smoker, COPD , quit 1997 OSA on CPAP who presents due to worsening shortness of breath., permanent atrial fibrillation Patient of Dr. Azucena Cecil  Recently seen by one of our providers last month  Echo December 26, 2022 results discussed in detail Normal LV function, normal RV function, severely dilated left atrium Well-functioning mitral valve repair Mild dilation ascending aorta  OSA Uses CPAP  Exhausted with exertion, no regular exercise program On torsemide 20 BID over the past month, leg swelling has improved Unclear if this has helped his breathing, possibly mild improvement  Insurance will not cover [employer] independently reviewed: 12/2022 - BUN 21, serum creatinine 1.02, potassium 4.0 10/2022 - TC 114, TG 132, HDL 35, LDL 53, albumin 4.2, AST/ALT normal, Hgb 12.5, PLT 149 04/2022 - TSH normal  Past Medical History:  Diagnosis Date   Atrial fibrillation (HCC)    Cancer (HCC)    prostate cancer   CHF (congestive heart failure) (HCC)    COPD (chronic obstructive pulmonary disease) (HCC)    Coronary artery disease    Depression    Hepatitis A infection    History of colon polyps    Hyperlipidemia    Hypertension    Mitral valve problem    Sleep apnea     Past Surgical  History:  Procedure Laterality Date   CARDIAC SURGERY     CARDIAC VALVE REPLACEMENT     CARDIOVERSION  2013   CATARACT EXTRACTION W/PHACO Right 08/06/2019   Procedure: CATARACT EXTRACTION PHACO AND INTRAOCULAR LENS PLACEMENT (IOC) RIGHT 12.34  01:25.4  14.5%;  Surgeon: Lockie Mola, MD;  Location: [facility];  Service: Ophthalmology;  Laterality: Right;   CATARACT EXTRACTION W/PHACO Left 08/27/2019   Procedure: CATARACT EXTRACTION PHACO AND INTRAOCULAR LENS PLACEMENT (IOC) LEFT 7.92 01:16.9 10.3%;  Surgeon: Lockie Mola, MD;  Location: [facility];  Service: Ophthalmology;  Laterality: Left;  sleep apnea   COLONOSCOPY N/A 09/28/2021   Procedure: COLONOSCOPY;  Surgeon: Toledo, Boykin Nearing, MD;  Location: [facility] ENDOSCOPY;  Service: Gastroenterology;  Laterality: N/A;  Cannot move up   COLONOSCOPY WITH PROPOFOL N/A 11/07/2017   Procedure: COLONOSCOPY WITH PROPOFOL;  Surgeon: Scot Jun, MD;  Location: [facility];  Service: Endoscopy;  Laterality: N/A;   CORONARY ANGIOPLASTY WITH STENT PLACEMENT  2011   CORONARY ARTERY BYPASS GRAFT     during mitral valve repair   ENDOVASCULAR REPAIR/STENT GRAFT N/A 11/26/2019   Procedure: ENDOVASCULAR REPAIR/STENT GRAFT;  Surgeon: Renford Dills, MD;  Location: [facility] INVASIVE CV LAB;  Service: Cardiovascular;  Laterality: N/A;   EYE SURGERY     LOWER EXTREMITY ANGIOGRAPHY N/A 08/29/2022   Procedure: Lower Extremity Angiography;  Surgeon: Renford Dills, MD;  Location: [facility] INVASIVE CV LAB;  Service: Cardiovascular;  Laterality: N/A;   MITRAL VALVE REPAIR  1997    Current Medications: Current Meds  Medication Sig   albuterol (PROVENTIL) (2.5 MG/3ML) 0.083% nebulizer solution Take 2.5 mg by nebulization every 6 (six) hours as needed for wheezing.   albuterol (VENTOLIN HFA) 108 (90 Base) MCG/ACT inhaler Inhale 2 puffs into the lungs every 6 (six) hours as needed for wheezing.    allopurinol (ZYLOPRIM) 100 MG tablet  Take 100 mg by mouth daily.   atorvastatin (LIPITOR) 20 MG tablet Take 20 mg by mouth at bedtime.    Calcium Carb-Cholecalciferol (OYSTER SHELL CALCIUM W/D) 500-5 MG-MCG TABS Take 1 tablet by mouth daily.   calcium carbonate (TUMS - DOSED IN MG ELEMENTAL CALCIUM) 500 MG chewable tablet Chew 1 tablet by mouth 2 (two) times daily.   colchicine 0.6 MG tablet Take 0.6 mg by mouth as directed. Take 2 tablets at onset of gout flare, then 1 tablet one hour later   CRANBERRY EXTRACT PO Take by mouth.   dabigatran (PRADAXA) 150 MG CAPS capsule Take 1 capsule (150 mg total) by mouth 2 (two) times daily.   enalapril (VASOTEC) 20 MG tablet Take 1 tablet (20 mg total) by mouth 2 (two) times daily.   fluticasone (FLONASE) 50 MCG/ACT nasal spray Place into both nostrils daily.   Fluticasone-Salmeterol (ADVAIR) 250-50 MCG/DOSE AEPB Inhale 1 puff into the lungs 2 (two) times daily.    metoprolol succinate (TOPROL-XL) 100 MG 24 hr tablet Take 100 mg by mouth daily.   Misc Natural Products (TART CHERRY ADVANCED PO) Take by mouth daily.    montelukast (SINGULAIR) 10 MG tablet Take 10 mg by mouth at bedtime.    potassium chloride (KLOR-CON M) 10 MEQ tablet Take 10 mEq by mouth 2 (two) times daily.   sildenafil (VIAGRA) 100 MG tablet Take 1 tablet (100 mg total) by mouth daily as needed for erectile dysfunction.   tamsulosin (FLOMAX) 0.4 MG CAPS capsule Take 1 capsule (0.4 mg total) by mouth daily after supper.   tiotropium (SPIRIVA) 18 MCG inhalation capsule Place 18 mcg into inhaler and inhale daily.   torsemide (DEMADEX) 20 MG tablet Take 2 tablets (40 mg total) by mouth daily.    Allergies:   Lisinopril   Social History   Socioeconomic History   Marital status: Married    Spouse name: Not on file   Number of children: Not on file   Years of education: Not on file   Highest education level: Not on file  Occupational History   Not on file  Tobacco Use   Smoking status: Former    Current packs/day: 0.00     Average packs/day: 1 pack/day for 25.0 years (25.0 ttl pk-yrs)    Types: Cigarettes    Start date: 06/21/1970    Quit date: 06/21/1995    Years since quitting: 27.5   Smokeless tobacco: Never  Vaping Use   Vaping status: Never Used  Substance and Sexual Activity   Alcohol use: Not Currently    Comment: Rare   Drug use: Never   Sexual activity: Yes    Birth control/protection: None  Other Topics Concern   Not on file  Social History Narrative   Lives with wife, Lanora Manis.  Pet, cat, in house.    Social Determinants of Health   Financial Resource Strain: Low Risk  (11/28/2022)   Received from [facility]   Overall Financial Resource Strain (CARDIA)  Difficulty of Paying Living Expenses: Not hard at all  Food Insecurity: No Food Insecurity (11/28/2022)   Received from [facility]   Hunger Vital Sign    Worried About Running Out of Food in the Last Year: Never true    Ran Out of Food in the Last Year: Never true  Transportation Needs: No Transportation Needs (11/28/2022)   Received from [facility] - Transportation    In the past 12 months, has lack of transportation kept you from medical appointments or from getting medications?: No    Lack of Transportation (Non-Medical): No  Physical Activity: Not on file  Stress: Not on file  Social Connections: Not on file     Family History:  The patient's family history includes Hypertension in his mother; Valvular heart disease in his father.  ROS:   Review of Systems  Constitutional: Negative.   HENT: Negative.    Respiratory:  Positive for shortness of breath.   Cardiovascular: Negative.   Gastrointestinal: Negative.   Musculoskeletal: Negative.   Neurological: Negative.   Psychiatric/Behavioral: Negative.    All other systems reviewed and are negative.    EKGs/Labs/Other Studies Reviewed:    Studies reviewed were summarized above. The additional studies  were reviewed today:  2D echo 12/26/2022: 1. Left ventricular ejection fraction, by estimation, is 55 to 60%. The  left ventricle has normal function. The left ventricle has no regional  wall motion abnormalities. The left ventricular internal cavity size was  mildly dilated. Left ventricular  diastolic parameters are indeterminate.   2. Right ventricular systolic function is normal. The right ventricular  size is normal. Tricuspid regurgitation signal is inadequate for assessing  PA pressure.   3. Left atrial size was severely dilated.   4. Right atrial size was mildly dilated.   5. The mitral valve has been repaired/replaced. No evidence of mitral  valve regurgitation. No evidence of mitral stenosis.Prosthetic  annuloplasty ring, 1997. Mean gradient 6 mm Hg   6. The aortic valve is normal in structure. Aortic valve regurgitation is  moderate. Aortic valve sclerosis/calcification is present, without any  evidence of aortic stenosis.   7. There is mild dilatation of the ascending aorta, measuring 41 mm.   8. The inferior vena cava is normal in size with greater than 50%  respiratory variability, suggesting right atrial pressure of 3 mmHg.  __________  2D echo 08/17/2021: 1. Left ventricular ejection fraction, by estimation, is 55 to 60%. The  left ventricle has normal function. The left ventricle has no regional  wall motion abnormalities. There is mild left ventricular hypertrophy.  Left ventricular diastolic parameters  are indeterminate.   2. Right ventricular systolic function is normal. The right ventricular  size is not well visualized.   3. Left atrial size was severely dilated.   4. Right atrial size was severely dilated.   5. The mitral valve has been repaired/replaced. No evidence of mitral  valve regurgitation. The mean mitral valve gradient is 4.2 mmHg. There is  a prosthetic annuloplasty ring present in the mitral position. Procedure  Date: 1997.   6. Tricuspid valve  regurgitation is mild to moderate.   7. The aortic valve is calcified. Aortic valve regurgitation is mild.  Aortic valve sclerosis/calcification is present, without any evidence of  aortic stenosis.   8. Aortic dilatation noted. There is mild dilatation of the aortic root  and of the ascending aorta, measuring 40 mm.   9. The inferior  vena cava is normal in size with greater than 50%  respiratory variability, suggesting right atrial pressure of 3 mmHg.  ____________  2D echo 11/18/2019: 1. Left ventricular ejection fraction, by estimation, is 50 to 55%. The  left ventricle has low normal function. The left ventricle has no regional  wall motion abnormalities. There is mild left ventricular hypertrophy.  Left ventricular diastolic  parameters are indeterminate.   2. Right ventricular systolic function is mildly reduced. The right  ventricular size is mildly enlarged.   3. Left atrial size was severely dilated.   4. Right atrial size was mild to moderately dilated.   5. The mitral valve is abnormal. Trivial mitral valve regurgitation. Mild  to moderate mitral stenosis. The mean mitral valve gradient is 5.0 mmHg.   6. The aortic valve is tricuspid. Aortic valve regurgitation is moderate.  No aortic stenosis is present.   7. Aortic dilatation noted. There is mild dilatation of the ascending  aorta measuring 42 mm.   8. Mildly dilated pulmonary artery.  __________  2D echo 11/17/2018: 1. The left ventricle has low normal systolic function, with an ejection  fraction of 50-55%. The cavity size was normal. Left ventricular diastolic  parameters were normal.   2. The right ventricle has normal systolic function. The cavity was  normal. There is no increase in right ventricular wall thickness.   3. Left atrial size was moderately dilated.   4. Right atrial size was moderately dilated.   5. A bioprosthetic valve is present in the mitral position. Normal mitral  valve prosthesis.   6.  Tricuspid valve regurgitation is mild-moderate.   7. The aorta is normal in size and structure.     Recent Labs: 12/14/2022: BUN 21; Creatinine, Ser 1.02; Potassium 4.0; Sodium 144  Recent Lipid Panel No results found for: "CHOL", "TRIG", "HDL", "CHOLHDL", "VLDL", "LDLCALC", "LDLDIRECT"  PHYSICAL EXAM:    VS:  BP 118/60 (BP Location: Left Arm, Patient Position: Sitting, Cuff Size: Normal)   Pulse 74   Ht 5\' 10"  (1.778 m)   Wt 270 lb 4 oz (122.6 kg)   SpO2 96%   BMI 38.78 kg/m   BMI: Body mass index is 38.78 kg/m.  Constitutional:  oriented to person, place, and time. No distress.  HENT:  Head: Grossly normal Eyes:  no discharge. No scleral icterus.  Neck: No JVD, no carotid bruits  Cardiovascular: Regular rate and rhythm, no murmurs appreciated Pulmonary/Chest: Clear to auscultation bilaterally, no wheezes or rails Abdominal: Soft.  no distension.  no tenderness.  Musculoskeletal: Normal range of motion Neurological:  normal muscle tone. Coordination normal. No atrophy Skin: Skin warm and dry Psychiatric: normal affect, pleasant   Wt Readings from Last 3 Encounters:  01/04/23 270 lb 4 oz (122.6 kg)  11/29/22 276 lb 3.2 oz (125.3 kg)  09/29/22 282 lb 3.2 oz (128 kg)     ASSESSMENT & PLAN:   Shortness of breath Seen last month by primary cardiologist, concern for diastolic CHF was started on torsemide 20 twice daily Edema much improved down from 2+, now appears euvolemic Follow-up BMP within normal range Echocardiogram with normal ejection fraction Recommend he continue torsemide 20 twice daily Stressed importance of conditioning, weight loss Suggested ischemic workup with Myoview given persistent shortness of breath symptoms despite torsemide  Coronary artery disease with stable angina History of CABG 1997, prior PCI to the left circumflex Lexiscan Myoview as above Continue metoprolol succinate, Pradaxa in place of aspirin, statin  Essential hypertension Blood  pressure is well controlled on today's visit. No changes made to the medications.  Morbid obesity Unable to afford Mounjaro We have encouraged continued exercise, careful diet management in an effort to lose weight.   Disposition: F/u with Dr. Azucena Cecil or Eula Listen   Medication Adjustments/Labs and Tests Ordered: Current medicines are reviewed at length with the patient today.  Concerns regarding medicines are outlined above. Medication changes, Labs and Tests ordered today are summarized above and listed in the Patient Instructions accessible in Encounters.   Signed, Dossie Arbour, MD, Ph.D [facility] [address] [place], [place] [address] [phone]

## 2023-01-04 ENCOUNTER — Ambulatory Visit: Payer: Medicare PPO | Attending: Physician Assistant | Admitting: Cardiovascular Disease

## 2023-01-04 ENCOUNTER — Encounter: Payer: Self-pay | Admitting: Cardiovascular Disease

## 2023-01-04 VITALS — BP 118/60 | HR 74 | Ht 70.0 in | Wt 270.2 lb

## 2023-01-04 DIAGNOSIS — I5030 Unspecified diastolic (congestive) heart failure: Secondary | ICD-10-CM | POA: Diagnosis not present

## 2023-01-04 DIAGNOSIS — R079 Chest pain, unspecified: Secondary | ICD-10-CM

## 2023-01-04 DIAGNOSIS — Z951 Presence of aortocoronary bypass graft: Secondary | ICD-10-CM

## 2023-01-04 DIAGNOSIS — I209 Angina pectoris, unspecified: Secondary | ICD-10-CM

## 2023-01-04 DIAGNOSIS — I25118 Atherosclerotic heart disease of native coronary artery with other forms of angina pectoris: Secondary | ICD-10-CM

## 2023-01-04 DIAGNOSIS — I4821 Permanent atrial fibrillation: Secondary | ICD-10-CM | POA: Diagnosis not present

## 2023-01-04 DIAGNOSIS — I77810 Thoracic aortic ectasia: Secondary | ICD-10-CM

## 2023-01-04 DIAGNOSIS — I10 Essential (primary) hypertension: Secondary | ICD-10-CM

## 2023-01-04 NOTE — Patient Instructions (Addendum)
.  Medication Instructions:  No changes  If you need a refill on your cardiac medications before your next appointment, please call your pharmacy.   Lab work: No new labs needed  Testing/Procedures: Your provider has ordered a Lexiscan/ Exercise Myoview Stress test. This will take place at [facility]. Please report to the [facility] medical mall entrance. The volunteers at the first desk will direct you where to go.  [facility] MYOVIEW  Your provider has ordered a Stress Test with nuclear imaging. The purpose of this test is to evaluate the blood supply to your heart muscle. This procedure is referred to as a "Non-Invasive Stress Test." This is because other than having an IV started in your vein, nothing is inserted or "invades" your body. Cardiac stress tests are done to find areas of poor blood flow to the heart by determining the extent of coronary artery disease (CAD). Some patients exercise on a treadmill, which naturally increases the blood flow to your heart, while others who are unable to walk on a treadmill due to physical limitations will have a pharmacologic/chemical stress agent called Lexiscan . This medicine will mimic walking on a treadmill by temporarily increasing your coronary blood flow.   Please note: these test may take anywhere between 2-4 hours to complete  How to prepare for your Myoview test:  Nothing to eat for 6 hours prior to the test No caffeine for 24 hours prior to test No smoking 24 hours prior to test. Your medication may be taken with water.  If your doctor stopped a medication because of this test, do not take that medication. Please don't take any diuretics (Torsemide) prior to the exam. No perfume, cologne or lotion. Wear comfortable walking shoes. No heels!   PLEASE NOTIFY THE OFFICE AT LEAST 24 HOURS IN ADVANCE IF YOU ARE UNABLE TO KEEP YOUR APPOINTMENT.  [phone] AND  PLEASE NOTIFY NUCLEAR MEDICINE AT [facility] AT LEAST 24 HOURS IN ADVANCE IF YOU ARE UNABLE TO KEEP  YOUR APPOINTMENT. [phone]   Follow-Up: At [facility], you and your health needs are our priority.  As part of our continuing mission to provide you with exceptional heart care, we have created designated Provider Care Teams.  These Care Teams include your primary Cardiologist (physician) and Advanced Practice Providers (APPs -  Physician Assistants and Nurse Practitioners) who all work together to provide you with the care you need, when you need it.  You will need a follow up appointment in 6 months with Dr. Azucena Cecil  Providers on your designated Care Team:   Nicolasa Ducking, NP Eula Listen, PA-C Cadence Fransico [place], [place]  COVID-19 Vaccine Information can be found at: [email or web address].[email or web address] For questions related to vaccine distribution or appointments, please email vaccine@[email or web address] or call [phone].

## 2023-01-08 ENCOUNTER — Telehealth: Payer: Self-pay | Admitting: Cardiology

## 2023-01-08 NOTE — Telephone Encounter (Signed)
Left voicemail, pt needs to schedule appt for lexiscan myoview.

## 2023-01-08 NOTE — Telephone Encounter (Signed)
-----   Message from Nurse Tarri Abernethy sent at 01/05/2023  7:54 AM EDT ----- Can you schedule this patient for a Lexiscan Myoview please? He was seen in clinic yesterday and it was not scheduled.   Thank you, Elon Jester, RN

## 2023-01-16 ENCOUNTER — Encounter
Admission: RE | Admit: 2023-01-16 | Discharge: 2023-01-16 | Disposition: A | Discharge status: Home / Self Care | Payer: Medicare PPO | Source: Ambulatory Visit | Attending: Cardiovascular Disease | Admitting: Cardiovascular Disease

## 2023-01-16 DIAGNOSIS — R079 Chest pain, unspecified: Secondary | ICD-10-CM | POA: Insufficient documentation

## 2023-01-16 MED ORDER — TECHNETIUM TC 99M TETROFOSMIN IV KIT
10.0000 | PACK | Freq: Once | INTRAVENOUS | Status: AC | PRN
  Administered 2023-01-16: 10.73 via INTRAVENOUS

## 2023-01-16 MED ORDER — REGADENOSON 0.4 MG/5ML IV SOLN
0.4000 mg | Freq: Once | INTRAVENOUS | Status: AC
  Administered 2023-01-16: 0.4 mg via INTRAVENOUS

## 2023-01-16 MED ORDER — TECHNETIUM TC 99M TETROFOSMIN IV KIT
30.0000 | PACK | Freq: Once | INTRAVENOUS | Status: AC
  Administered 2023-01-16: 31.19 via INTRAVENOUS

## 2023-01-17 LAB — NM MYOCAR MULTI W/SPECT W/WALL MOTION / EF
LV dias vol: 165 mL (ref 62–150)
LV sys vol: 65 mL
Nuc Stress EF: 61 %
Peak HR: 73 {beats}/min
Rest HR: 64 {beats}/min
Rest Nuclear Isotope Dose: 10.7 mCi
SDS: 4
SRS: 4
SSS: 1
ST Depression (mm): 0 mm
Stress Nuclear Isotope Dose: 31.2 mCi
TID: 0.92

## 2023-01-23 ENCOUNTER — Ambulatory Visit: Payer: Medicare PPO | Attending: Cardiology | Admitting: Cardiology

## 2023-01-23 ENCOUNTER — Encounter: Payer: Self-pay | Admitting: Cardiology

## 2023-01-23 VITALS — BP 118/58 | HR 66 | Ht 70.0 in | Wt 278.2 lb

## 2023-01-23 DIAGNOSIS — R9439 Abnormal result of other cardiovascular function study: Secondary | ICD-10-CM | POA: Diagnosis not present

## 2023-01-23 DIAGNOSIS — I4821 Permanent atrial fibrillation: Secondary | ICD-10-CM | POA: Diagnosis not present

## 2023-01-23 DIAGNOSIS — I25118 Atherosclerotic heart disease of native coronary artery with other forms of angina pectoris: Secondary | ICD-10-CM | POA: Diagnosis not present

## 2023-01-23 DIAGNOSIS — I5030 Unspecified diastolic (congestive) heart failure: Secondary | ICD-10-CM | POA: Diagnosis not present

## 2023-01-23 DIAGNOSIS — I77810 Thoracic aortic ectasia: Secondary | ICD-10-CM

## 2023-01-23 MED ORDER — SEMAGLUTIDE-WEIGHT MANAGEMENT 0.5 MG/0.5ML ~~LOC~~ SOAJ
0.5000 mg | SUBCUTANEOUS | 0 refills | Status: AC
Start: 2023-03-21 — End: 2023-04-18

## 2023-01-23 MED ORDER — ASPIRIN 81 MG PO TBEC: 81.0000 mg | DELAYED_RELEASE_TABLET | Freq: Every day | ORAL | Status: DC

## 2023-01-23 MED ORDER — SEMAGLUTIDE-WEIGHT MANAGEMENT 0.25 MG/0.5ML ~~LOC~~ SOAJ: 0.2500 mg | SUBCUTANEOUS | 0 refills | Status: AC

## 2023-01-23 MED ORDER — SEMAGLUTIDE-WEIGHT MANAGEMENT 2.4 MG/0.75ML ~~LOC~~ SOAJ: 2.4000 mg | SUBCUTANEOUS | 3 refills | Status: DC

## 2023-01-23 MED ORDER — SEMAGLUTIDE-WEIGHT MANAGEMENT 1 MG/0.5ML ~~LOC~~ SOAJ: 1.0000 mg | SUBCUTANEOUS | 0 refills | Status: AC

## 2023-01-23 MED ORDER — SEMAGLUTIDE-WEIGHT MANAGEMENT 1.7 MG/0.75ML ~~LOC~~ SOAJ
1.7000 mg | SUBCUTANEOUS | 0 refills | Status: DC
Start: 2023-05-18 — End: 2023-05-23

## 2023-01-23 NOTE — Progress Notes (Signed)
 Cardiology Office Note:    Date:  01/23/2023   ID:  Leslie Prince, DOB 1946-02-09, MRN [id number]  PCP:  Glover Lenis, MD  [facility] Cardiologist:  Redell Cave, MD  [facility] Electrophysiologist:  None   Referring MD: Glover Lenis, MD   Chief Complaint  Patient presents with   Follow-up    Following up post stress test. Patient is doing well on today. Meds reviewed.     History of Present Illness:    Leslie Prince is a 77 y.o. male with a hx of chronic atrial fibrillation on Pradaxa , CAD PCI w/ DES to LCx 2011, CABG x 3 in 1997, mitral valve repair 1997, AAA s/p endovascular stent repair 11/2019, hypertension, former smoker, COPD who presents for follow-up.  Last seen due to shortness of breath with exertion, Lexiscan  Myoview  was obtained to evaluate any significant ischemia.  Lasix  was stopped, torsemide  started due to volume overload and leg edema.  Endorses adequate urination with torsemide .  States missing some doses of torsemide , did not take yesterday.  Has not taken any this morning.  Denies chest pain.  Occasional bruises with taking Pradaxa , no significant bleeding.   Prior notes Echo 08/2021 EF 55 to 60%, normal functioning mitral valve prosthesis/ring. Patient had a recent abdominal CT showing abdominal aortic aneurysm up to 5 cm.  Endovascular repair by vascular surgery was performed on 11/27/2019.   Echocardiogram 11/2019 showed low normal ejection fraction, EF 50 to 55%.  Mild ascending aorta dilatation, 42 mm.   Past Medical History:  Diagnosis Date   Atrial fibrillation (HCC)    Cancer [facility])    prostate cancer   CHF (congestive heart failure) (HCC)    COPD (chronic obstructive pulmonary disease) (HCC)    Coronary artery disease    Depression    Hepatitis A infection    History of colon polyps    Hyperlipidemia    Hypertension    Mitral valve problem    Sleep apnea     Past Surgical History:  Procedure Laterality Date   CARDIAC  SURGERY     CARDIAC VALVE REPLACEMENT     CARDIOVERSION  2013   CATARACT EXTRACTION W/PHACO Right 08/06/2019   Procedure: CATARACT EXTRACTION PHACO AND INTRAOCULAR LENS PLACEMENT (IOC) RIGHT 12.34  01:25.4  14.5%;  Surgeon: Mittie Gaskin, MD;  Location: [facility];  Service: Ophthalmology;  Laterality: Right;   CATARACT EXTRACTION W/PHACO Left 08/27/2019   Procedure: CATARACT EXTRACTION PHACO AND INTRAOCULAR LENS PLACEMENT (IOC) LEFT 7.92 01:16.9 10.3%;  Surgeon: Mittie Gaskin, MD;  Location: [facility];  Service: Ophthalmology;  Laterality: Left;  sleep apnea   COLONOSCOPY N/A 09/28/2021   Procedure: COLONOSCOPY;  Surgeon: Toledo, Ladell POUR, MD;  Location: [facility] ENDOSCOPY;  Service: Gastroenterology;  Laterality: N/A;  Cannot move up   COLONOSCOPY WITH PROPOFOL  N/A 11/07/2017   Procedure: COLONOSCOPY WITH PROPOFOL ;  Surgeon: Viktoria Lamar DASEN, MD;  Location: [facility] END[facility];  Service: Endoscopy;  Laterality: N/A;   CORONARY ANGIOPLASTY WITH STENT PLACEMENT  2011   CORONARY ARTERY BYPASS GRAFT     during mitral valve repair   ENDOVASCULAR REPAIR/STENT GRAFT N/A 11/26/2019   Procedure: ENDOVASCULAR REPAIR/STENT GRAFT;  Surgeon: Jama Cordella MATSU, MD;  Location: [facility] INVASIVE CV LAB;  Service: Cardiovascular;  Laterality: N/A;   EYE SURGERY     LOWER EXTREMITY ANGIOGRAPHY N/A 08/29/2022   Procedure: Lower Extremity Angiography;  Surgeon: Jama Cordella MATSU, MD;  Location: [facility] INVASIVE CV LAB;  Service: Cardiovascular;  Laterality:  N/A;   MITRAL VALVE REPAIR  1997    Current Medications: Current Meds  Medication Sig   albuterol  (PROVENTIL ) (2.5 MG/3ML) 0.083% nebulizer solution Take 2.5 mg by nebulization every 6 (six) hours as needed for wheezing.   albuterol  (VENTOLIN  HFA) 108 (90 Base) MCG/ACT inhaler Inhale 2 puffs into the lungs every 6 (six) hours as needed for wheezing.    allopurinol  (ZYLOPRIM ) 100 MG tablet Take 100 mg by mouth daily.   aspirin  EC 81 MG  tablet Take 1 tablet (81 mg total) by mouth daily. Swallow whole.   atorvastatin  (LIPITOR) 20 MG tablet Take 20 mg by mouth at bedtime.    calcium  carbonate (TUMS - DOSED IN MG ELEMENTAL CALCIUM ) 500 MG chewable tablet Chew 1 tablet by mouth 2 (two) times daily.   colchicine  0.6 MG tablet Take 0.6 mg by mouth as directed. Take 2 tablets at onset of gout flare, then 1 tablet one hour later   CRANBERRY EXTRACT PO Take by mouth.   dabigatran  (PRADAXA ) 150 MG CAPS capsule Take 1 capsule (150 mg total) by mouth 2 (two) times daily.   enalapril  (VASOTEC ) 20 MG tablet Take 1 tablet (20 mg total) by mouth 2 (two) times daily.   Fluticasone -Salmeterol (ADVAIR ) 250-50 MCG/DOSE AEPB Inhale 1 puff into the lungs 2 (two) times daily.    metoprolol  succinate (TOPROL -XL) 100 MG 24 hr tablet Take 100 mg by mouth daily.   Misc Natural Products (TART CHERRY ADVANCED PO) Take by mouth daily.    montelukast  (SINGULAIR ) 10 MG tablet Take 10 mg by mouth at bedtime.    potassium chloride  (KLOR-CON  M) 10 MEQ tablet Take 10 mEq by mouth 2 (two) times daily.   Semaglutide -Weight Management 0.25 MG/0.5ML SOAJ Inject 0.25 mg into the skin once a week for 28 days.   [START ON 03/21/2023] Semaglutide -Weight Management 0.5 MG/0.5ML SOAJ Inject 0.5 mg into the skin once a week for 28 days.   [START ON 03/19/2023] Semaglutide -Weight Management 1 MG/0.5ML SOAJ Inject 1 mg into the skin once a week for 28 days.   [START ON 05/18/2023] Semaglutide -Weight Management 1.7 MG/0.75ML SOAJ Inject 1.7 mg into the skin once a week for 28 days.   [START ON 05/19/2023] Semaglutide -Weight Management 2.4 MG/0.75ML SOAJ Inject 2.4 mg into the skin once a week.   sildenafil  (VIAGRA ) 100 MG tablet Take 1 tablet (100 mg total) by mouth daily as needed for erectile dysfunction.   tamsulosin  (FLOMAX ) 0.4 MG CAPS capsule Take 1 capsule (0.4 mg total) by mouth daily after supper.   tiotropium (SPIRIVA ) 18 MCG inhalation capsule Place 18 mcg into inhaler and  inhale daily.   torsemide  (DEMADEX ) 20 MG tablet Take 2 tablets (40 mg total) by mouth daily.   [DISCONTINUED] fluticasone  (FLONASE ) 50 MCG/ACT nasal spray Place into both nostrils daily.     Allergies:   Lisinopril   Social History   Socioeconomic History   Marital status: Married    Spouse name: Not on file   Number of children: Not on file   Years of education: Not on file   Highest education level: Not on file  Occupational History   Not on file  Tobacco Use   Smoking status: Former    Current packs/day: 0.00    Average packs/day: 1 pack/day for 25.0 years (25.0 ttl pk-yrs)    Types: Cigarettes    Start date: 06/21/1970    Quit date: 06/21/1995    Years since quitting: 27.6   Smokeless tobacco: Never  Vaping Use   Vaping status: Never Used  Substance and Sexual Activity   Alcohol use: Not Currently    Comment: Rare   Drug use: Never   Sexual activity: Yes    Birth control/protection: None  Other Topics Concern   Not on file  Social History Narrative   Lives with wife, Almarie.  Pet, cat, in house.    Social Determinants of Health   Financial Resource Strain: Low Risk  (11/28/2022)   Received from [facility]   Overall Financial Resource Strain (CARDIA)    Difficulty of Paying Living Expenses: Not hard at all  Food Insecurity: No Food Insecurity (11/28/2022)   [facility] from [facility]   Hunger Vital Sign    Worried About Running Out of Food in the Last Year: Never true    Ran Out of Food in the Last Year: Never true  Transportation Needs: No Transportation Needs (11/28/2022)   Received from [facility] - Transportation    In the past 12 months, has lack of transportation kept you from medical appointments or from getting medications?: No    Lack of Transportation (Non-Medical): No  Physical Activity: Not on file  Stress: Not on file  Social Connections: Not on file     Family History: The  patient's family history includes Hypertension in his mother; Valvular heart disease in his father.  ROS:   Please see the history of present illness.     All other systems reviewed and are negative.  EKGs/Labs/Other Studies Reviewed:    The following studies were reviewed today:  EKG Interpretation Date/Time:  Tuesday January 23 2023 09:11:25 EDT Ventricular Rate:  66 PR Interval:    QRS Duration:  102 QT Interval:  450 QTC Calculation: 471 R Axis:   -4  Text Interpretation: Atrial fibrillation Nonspecific ST abnormality Confirmed by Darliss Rogue ([id number]) on 01/23/2023 9:16:31 AM   Recent Labs: 12/14/2022: BUN 21; Creatinine, Ser 1.02; Potassium 4.0; Sodium 144  Recent Lipid Panel No results found for: "CHOL", "TRIG", "HDL", "CHOLHDL", "VLDL", "LDLCALC", "LDLDIRECT"  Physical Exam:    VS:  BP (!) 118/58 (BP Location: Left Arm, Patient Position: Sitting, Cuff Size: Large)   Pulse 66   Ht 5\' 10"  (1.778 m)   Wt 278 lb 3.2 oz (126.2 kg)   SpO2 96%   BMI 39.92 kg/m     Wt Readings from Last 3 Encounters:  01/23/23 278 lb 3.2 oz (126.2 kg)  01/04/23 270 lb 4 oz (122.6 kg)  11/29/22 276 lb 3.2 oz (125.3 kg)     GEN:  Well nourished, well developed in no acute distress HEENT: Normal NECK: No JVD; No carotid bruits CARDIAC: Irregular irregular, no murmurs, rubs, gallops RESPIRATORY: Diminished breath sounds at bases ABDOMEN: Soft, non-tender, non-distended MUSCULOSKELETAL:  1-2+ edema; No deformity  SKIN: Warm and dry NEUROLOGIC:  Alert and oriented x 3 PSYCHIATRIC:  Normal affect   ASSESSMENT:    1. Abnormal stress test   2. Coronary artery disease of native artery of native heart with stable angina pectoris (HCC)   3. Heart failure with preserved ejection fraction, unspecified HF chronicity (HCC)   4. Permanent atrial fibrillation (HCC)   5. Ascending aorta dilation (HCC)   6. Morbid obesity (HCC)    PLAN:    In order of problems listed above:  Dyspnea  on exertion, Lexiscan  Myoview  with inferolateral wall ischemia.  Echo 9/24 with normal EF 55 to 60%.  Schedule right and left heart cath.  Hold Pradaxa  48 hours prior to left heart cath.  Take aspirin  81 mg daily while Pradaxa  is on hold. CAD/CABG x 3 1997, PCI to the left circumflex.  Abnormal stress test.  Right/left heart cath as above.  Cont Lipitor 20, Toprol -XL 100 mg daily, Pradaxa . HFpEF, edema still present, not compliant with torsemide  as prescribed.  Compliance recommended.  Continue torsemide  40 mg daily.  Right heart cath as above. permanent atrial fibrillation, heart rate controlled.  Continue Toprol -XL and Pradaxa .   Mild Ascending aortic dilation, 4.1cm.  cont serial monitoring with CT scans obtained for lung cancer screening. Morbid obesity, start Wegovy , patient has CAD, HFpEF.  Follow-up in 6-8 weeks.  Informed Consent   Shared Decision Making/Informed Consent The risks [stroke (1 in 1000), death (1 in 1000), kidney failure [usually temporary] (1 in 500), bleeding (1 in 200), allergic reaction [possibly serious] (1 in 200)], benefits (diagnostic support and management of coronary artery disease) and alternatives of a cardiac catheterization were discussed in detail with Mr. Kirby and he is willing to proceed.      Medication Adjustments/Labs and Tests Ordered: Current medicines are reviewed at length with the patient today.  Concerns regarding medicines are outlined above.  Orders Placed This Encounter  Procedures   Basic Metabolic Panel (BMET)   CBC   EKG 12-Lead    Meds ordered this encounter  Medications   Semaglutide -Weight Management 0.25 MG/0.5ML SOAJ    Sig: Inject 0.25 mg into the skin once a week for 28 days.    Dispense:  2 mL    Refill:  0   Semaglutide -Weight Management 0.5 MG/0.5ML SOAJ    Sig: Inject 0.5 mg into the skin once a week for 28 days.    Dispense:  2 mL    Refill:  0   Semaglutide -Weight Management 1 MG/0.5ML SOAJ    Sig: Inject 1 mg  into the skin once a week for 28 days.    Dispense:  2 mL    Refill:  0   Semaglutide -Weight Management 1.7 MG/0.75ML SOAJ    Sig: Inject 1.7 mg into the skin once a week for 28 days.    Dispense:  3 mL    Refill:  0   Semaglutide -Weight Management 2.4 MG/0.75ML SOAJ    Sig: Inject 2.4 mg into the skin once a week.    Dispense:  3 mL    Refill:  3   aspirin  EC 81 MG tablet    Sig: Take 1 tablet (81 mg total) by mouth daily. Swallow whole.     Patient Instructions  Medication Instructions:   START Aspirin  - Take one tablet (81mg )  by mouth daily.   Start taking Wegovy   Month 1: 0.25 mg once a week.  Month 2: 0.5 mg once a week. (Call or send us  a MyChart message when you are on week 2, so we can send your next dose in for you).  Month 3: 1 mg once a week.  Month 4: 1.7 mg once a week.  Month 5 and beyond: 2.4 mg once a week (maintenance dose)   *If you need a refill on your cardiac medications before your next appointment, please call your pharmacy*   Lab Work:  Your physician recommends labs - BMP / CBC  If you have labs (blood work) drawn today and your tests are completely normal, you will receive your results only by: MyChart Message (if you have MyChart) OR A  paper copy in the mail If you have any lab test that is abnormal or we need to change your treatment, we will call you to review the results.   Testing/Procedures:   [place] A DEPT OF [place] AT [place] [address] [place] [address] Dept: [phone] Loc: [phone]  CHAYANNE SPEIR  01/23/2023  You are scheduled for a Cardiac Catheterization on Monday, October 28 with Dr. Deatrice Cage.  1. Please arrive at the [facility] Vascular Center Entrance of [facility], [address], [place] [address] at 8:30 AM (This is 1 hour(s) prior to your procedure time).  Proceed to the Check-In Desk directly inside the  entrance.  Procedure Parking: Use the entrance off of the [address] side of the hospital. Turn right upon entering and follow the driveway to parking that is directly in front of the Heart & Vascular Center. There is no valet parking available at this entrance, however there is an awning directly in front of the Heart & Vascular Center for drop off/ pick up for patients.  Special note: Every effort is made to have your procedure done on time. Please understand that emergencies sometimes delay scheduled procedures.  2. Diet: Do not eat solid foods after midnight.  The patient may have clear liquids until 5am upon the day of the procedure.  3. Labs: You will need to have blood drawn TODAY  4. Medication instructions in preparation for your procedure:   Contrast Allergy: No  Stop taking Pradaxa  (Dabigatran ) on Friday, October 25.  HOLD Torsemide  the morning if the procedure  On the morning of your procedure, take your Aspirin  81 mg and any morning medicines NOT listed above.  You may use sips of water.  5. Plan to go home the same day, you will only stay overnight if medically necessary. 6. Bring a current list of your medications and current insurance cards. 7. You MUST have a responsible person to drive you home. 8. Someone MUST be with you the first 24 hours after you arrive home or your discharge will be delayed. 9. Please wear clothes that are easy to get on and off and wear slip-on shoes.  Thank you for allowing us  to care for you!   -- [place] [facility]    Follow-Up: At [facility], you and your health needs are our priority.  As part of our continuing mission to provide you with exceptional heart care, we have created designated Provider Care Teams.  These Care Teams include your primary Cardiologist (physician) and Advanced Practice Providers (APPs -  Physician Assistants and Nurse Practitioners) who all work together to provide you with  the care you need, when you need it.  We recommend signing up for the patient portal called "MyChart".  Sign up information is provided on this After Visit Summary.  MyChart is used to connect with patients for Virtual Visits (Telemedicine).  Patients are able to view lab/test results, encounter notes, upcoming appointments, etc.  Non-urgent messages can be sent to your provider as well.   To learn more about what you can do with MyChart, go to [email or web address].    Your next appointment:   6 - 8 Weeks  Provider:   You may see Redell Cave, MD or one of the following Advanced Practice Providers on your designated Care Team:   Lonni Meager, NP Bernardino Bring, PA-C Cadence Franchester, PA-C Tylene Lunch, NP  Signed, Redell Cave, MD  01/23/2023 10:14 AM    [facility]

## 2023-01-23 NOTE — Patient Instructions (Addendum)
Medication Instructions:   START Aspirin - Take one tablet (81mg )  by mouth daily.   Start taking Wegovy  Month 1: 0.25 mg once a week.  Month 2: 0.5 mg once a week. (Call or send [place] a MyChart message when you are on week 2, so we can send your next dose in for you).  Month 3: 1 mg once a week.  Month 4: 1.7 mg once a week.  Month 5 and beyond: 2.4 mg once a week (maintenance dose)   *If you need a refill on your cardiac medications before your next appointment, please call your pharmacy*   Lab Work:  Your physician recommends labs - BMP / CBC  If you have labs (blood work) drawn today and your tests are completely normal, you will receive your results only by: MyChart Message (if you have MyChart) OR A paper copy in the mail If you have any lab test that is abnormal or we need to change your treatment, we will call you to review the results.   Testing/Procedures:   [place] A DEPT OF [facility] AT [place] [address] [place] [address] Dept: [phone] Loc: [phone]  MACE WEINBERG  01/23/2023  You are scheduled for a Cardiac Catheterization on Monday, October 28 with Dr. Lorine Bears.  1. Please arrive at the [facility] Vascular Center Entrance of [facility], [address], [place] [address] at 8:30 AM (This is 1 hour(s) prior to your procedure time).  Proceed to the Check-In Desk directly inside the entrance.  Procedure Parking: Use the entrance off of the [address] side of the hospital. Turn right upon entering and follow the driveway to parking that is directly in front of the Heart & Vascular Center. There is no valet parking available at this entrance, however there is an awning directly in front of the Heart & Vascular Center for drop off/ pick up for patients.  Special note: Every effort is made to have your procedure done on time. Please understand that  emergencies sometimes delay scheduled procedures.  2. Diet: Do not eat solid foods after midnight.  The patient may have clear liquids until 5am upon the day of the procedure.  3. Labs: You will need to have blood drawn TODAY  4. Medication instructions in preparation for your procedure:   Contrast Allergy: No  Stop taking Pradaxa (Dabigatran) on Friday, October 25.  HOLD Torsemide the morning if the procedure  On the morning of your procedure, take your Aspirin 81 mg and any morning medicines NOT listed above.  You may use sips of water.  5. Plan to go home the same day, you will only stay overnight if medically necessary. 6. Bring a current list of your medications and current insurance cards. 7. You MUST have a responsible person to drive you home. 8. Someone MUST be with you the first 24 hours after you arrive home or your discharge will be delayed. 9. Please wear clothes that are easy to get on and off and wear slip-on shoes.  Thank you for allowing [place] to care for you!   -- [place] [facility]    Follow-Up: At [facility], you and your health needs are our priority.  As part of our continuing mission to provide you with exceptional heart care, we have created designated Provider Care Teams.  These Care Teams include your primary Cardiologist (physician) and Advanced  Practice Providers (APPs -  Physician Assistants and Nurse Practitioners) who all work together to provide you with the care you need, when you need it.  We recommend signing up for the patient portal called "MyChart".  Sign up information is provided on this After Visit Summary.  MyChart is used to connect with patients for Virtual Visits (Telemedicine).  Patients are able to view lab/test results, encounter notes, upcoming appointments, etc.  Non-urgent messages can be sent to your provider as well.   To learn more about what you can do with MyChart, go to [email or web address].     Your next appointment:   6 - 8 Weeks  Provider:   You may see Debbe Odea, MD or one of the following Advanced Practice Providers on your designated Care Team:   Nicolasa Ducking, NP Eula Listen, PA-C Cadence Fransico Michael, PA-C Charlsie Quest, NP

## 2023-01-23 NOTE — H&P (View-Only) (Signed)
Cardiology Office Note:    Date:  01/23/2023   ID:  Leslie Prince, DOB February 26, 1946, MRN [id number]  PCP:  Dorothey Baseman, MD  [facility] Cardiologist:  Debbe Odea, MD  [facility] Electrophysiologist:  None   Referring MD: Dorothey Baseman, MD   Chief Complaint  Patient presents with   Follow-up    Following up post stress test. Patient is doing well on today. Meds reviewed.     History of Present Illness:    Leslie Prince is a 77 y.o. male with a hx of chronic atrial fibrillation on Pradaxa, CAD PCI w/ DES to LCx 2011, CABG x 3 in 1997, mitral valve repair 1997, AAA s/p endovascular stent repair 11/2019, hypertension, former smoker, COPD who presents for follow-up.  Last seen due to shortness of breath with exertion, Lexiscan Myoview was obtained to evaluate any significant ischemia.  Lasix was stopped, torsemide started due to volume overload and leg edema.  Endorses adequate urination with torsemide.  States missing some doses of torsemide, did not take yesterday.  Has not taken any this morning.  Denies chest pain.  Occasional bruises with taking Pradaxa, no significant bleeding.   Prior notes Echo 08/2021 EF 55 to 60%, normal functioning mitral valve prosthesis/ring. Patient had a recent abdominal CT showing abdominal aortic aneurysm up to 5 cm.  Endovascular repair by vascular surgery was performed on 11/27/2019.   Echocardiogram 11/2019 showed low normal ejection fraction, EF 50 to 55%.  Mild ascending aorta dilatation, 42 mm.   Past Medical History:  Diagnosis Date   Atrial fibrillation (HCC)    Cancer [facility])    prostate cancer   CHF (congestive heart failure) (HCC)    COPD (chronic obstructive pulmonary disease) (HCC)    Coronary artery disease    Depression    Hepatitis A infection    History of colon polyps    Hyperlipidemia    Hypertension    Mitral valve problem    Sleep apnea     Past Surgical History:  Procedure Laterality Date   CARDIAC  SURGERY     CARDIAC VALVE REPLACEMENT     CARDIOVERSION  2013   CATARACT EXTRACTION W/PHACO Right 08/06/2019   Procedure: CATARACT EXTRACTION PHACO AND INTRAOCULAR LENS PLACEMENT (IOC) RIGHT 12.34  01:25.4  14.5%;  Surgeon: Lockie Mola, MD;  Location: [facility];  Service: Ophthalmology;  Laterality: Right;   CATARACT EXTRACTION W/PHACO Left 08/27/2019   Procedure: CATARACT EXTRACTION PHACO AND INTRAOCULAR LENS PLACEMENT (IOC) LEFT 7.92 01:16.9 10.3%;  Surgeon: Lockie Mola, MD;  Location: [facility];  Service: Ophthalmology;  Laterality: Left;  sleep apnea   COLONOSCOPY N/A 09/28/2021   Procedure: COLONOSCOPY;  Surgeon: Toledo, Boykin Nearing, MD;  Location: [facility] ENDOSCOPY;  Service: Gastroenterology;  Laterality: N/A;  Cannot move up   COLONOSCOPY WITH PROPOFOL N/A 11/07/2017   Procedure: COLONOSCOPY WITH PROPOFOL;  Surgeon: Scot Jun, MD;  Location[facility];  Service: Endoscopy;  Laterality: N/A;   CORONARY ANGIOPLASTY WITH STENT PLACEMENT  2011   CORONARY ARTERY BYPASS GRAFT     during mitral valve repair   ENDOVASCULAR REPAIR/STENT GRAFT N/A 11/26/2019   Procedure: ENDOVASCULAR REPAIR/STENT GRAFT;  Surgeon: Renford Dills, MD;  Location: [facility] INVASIVE CV LAB;  Service: Cardiovascular;  Laterality: N/A;   EYE SURGERY     LOWER EXTREMITY ANGIOGRAPHY N/A 08/29/2022   Procedure: Lower Extremity Angiography;  Surgeon: Renford Dills, MD;  Location: [facility] INVASIVE CV LAB;  Service: Cardiovascular;  Laterality:  N/A;   MITRAL VALVE REPAIR  1997    Current Medications: Current Meds  Medication Sig   albuterol (PROVENTIL) (2.5 MG/3ML) 0.083% nebulizer solution Take 2.5 mg by nebulization every 6 (six) hours as needed for wheezing.   albuterol (VENTOLIN HFA) 108 (90 Base) MCG/ACT inhaler Inhale 2 puffs into the lungs every 6 (six) hours as needed for wheezing.    allopurinol (ZYLOPRIM) 100 MG tablet Take 100 mg by mouth daily.   aspirin EC 81 MG  tablet Take 1 tablet (81 mg total) by mouth daily. Swallow whole.   atorvastatin (LIPITOR) 20 MG tablet Take 20 mg by mouth at bedtime.    calcium carbonate (TUMS - DOSED IN MG ELEMENTAL CALCIUM) 500 MG chewable tablet Chew 1 tablet by mouth 2 (two) times daily.   colchicine 0.6 MG tablet Take 0.6 mg by mouth as directed. Take 2 tablets at onset of gout flare, then 1 tablet one hour later   CRANBERRY EXTRACT PO Take by mouth.   dabigatran (PRADAXA) 150 MG CAPS capsule Take 1 capsule (150 mg total) by mouth 2 (two) times daily.   enalapril (VASOTEC) 20 MG tablet Take 1 tablet (20 mg total) by mouth 2 (two) times daily.   Fluticasone-Salmeterol (ADVAIR) 250-50 MCG/DOSE AEPB Inhale 1 puff into the lungs 2 (two) times daily.    metoprolol succinate (TOPROL-XL) 100 MG 24 hr tablet Take 100 mg by mouth daily.   Misc Natural Products (TART CHERRY ADVANCED PO) Take by mouth daily.    montelukast (SINGULAIR) 10 MG tablet Take 10 mg by mouth at bedtime.    potassium chloride (KLOR-CON M) 10 MEQ tablet Take 10 mEq by mouth 2 (two) times daily.   Semaglutide-Weight Management 0.25 MG/0.5ML SOAJ Inject 0.25 mg into the skin once a week for 28 days.   [START ON 03/21/2023] Semaglutide-Weight Management 0.5 MG/0.5ML SOAJ Inject 0.5 mg into the skin once a week for 28 days.   [START ON 03/19/2023] Semaglutide-Weight Management 1 MG/0.5ML SOAJ Inject 1 mg into the skin once a week for 28 days.   [START ON 05/18/2023] Semaglutide-Weight Management 1.7 MG/0.75ML SOAJ Inject 1.7 mg into the skin once a week for 28 days.   [START ON 05/19/2023] Semaglutide-Weight Management 2.4 MG/0.75ML SOAJ Inject 2.4 mg into the skin once a week.   sildenafil (VIAGRA) 100 MG tablet Take 1 tablet (100 mg total) by mouth daily as needed for erectile dysfunction.   tamsulosin (FLOMAX) 0.4 MG CAPS capsule Take 1 capsule (0.4 mg total) by mouth daily after supper.   tiotropium (SPIRIVA) 18 MCG inhalation capsule Place 18 mcg into inhaler and  inhale daily.   torsemide (DEMADEX) 20 MG tablet Take 2 tablets (40 mg total) by mouth daily.   [DISCONTINUED] fluticasone (FLONASE) 50 MCG/ACT nasal spray Place into both nostrils daily.     Allergies:   Lisinopril   Social History   Socioeconomic History   Marital status: Married    Spouse name: Not on file   Number of children: Not on file   Years of education: Not on file   Highest education level: Not on file  Occupational History   Not on file  Tobacco Use   Smoking status: Former    Current packs/day: 0.00    Average packs/day: 1 pack/day for 25.0 years (25.0 ttl pk-yrs)    Types: Cigarettes    Start date: 06/21/1970    Quit date: 06/21/1995    Years since quitting: 27.6   Smokeless tobacco: Never  Vaping Use   Vaping status: Never Used  Substance and Sexual Activity   Alcohol use: Not Currently    Comment: Rare   Drug use: Never   Sexual activity: Yes    Birth control/protection: None  Other Topics Concern   Not on file  Social History Narrative   Lives with wife, Lanora Manis.  Pet, cat, in house.    Social Determinants of Health   Financial Resource Strain: Low Risk  (11/28/2022)   Received from [facility]   Overall Financial Resource Strain (CARDIA)    Difficulty of Paying Living Expenses: Not hard at all  Food Insecurity: No Food Insecurity (11/28/2022)   Received from [facility]   Hunger Vital Sign    Worried About Running Out of Food in the Last Year: Never true    Ran Out of Food in the Last Year: Never true  Transportation Needs: No Transportation Needs (11/28/2022)   Received from [facility] - Transportation    In the past 12 months, has lack of transportation kept you from medical appointments or from getting medications?: No    Lack of Transportation (Non-Medical): No  Physical Activity: Not on file  Stress: Not on file  Social Connections: Not on file     Family History: The  patient's family history includes Hypertension in his mother; Valvular heart disease in his father.  ROS:   Please see the history of present illness.     All other systems reviewed and are negative.  EKGs/Labs/Other Studies Reviewed:    The following studies were reviewed today:  EKG Interpretation Date/Time:  Tuesday January 23 2023 09:11:25 EDT Ventricular Rate:  66 PR Interval:    QRS Duration:  102 QT Interval:  450 QTC Calculation: 471 R Axis:   -4  Text Interpretation: Atrial fibrillation Nonspecific ST abnormality Confirmed by Debbe Odea ([id number]) on 01/23/2023 9:16:31 AM   Recent Labs: 12/14/2022: BUN 21; Creatinine, Ser 1.02; Potassium 4.0; Sodium 144  Recent Lipid Panel No results found for: "CHOL", "TRIG", "HDL", "CHOLHDL", "VLDL", "LDLCALC", "LDLDIRECT"  Physical Exam:    VS:  BP (!) 118/58 (BP Location: Left Arm, Patient Position: Sitting, Cuff Size: Large)   Pulse 66   Ht 5\' 10"  (1.778 m)   Wt 278 lb 3.2 oz (126.2 kg)   SpO2 96%   BMI 39.92 kg/m     Wt Readings from Last 3 Encounters:  01/23/23 278 lb 3.2 oz (126.2 kg)  01/04/23 270 lb 4 oz (122.6 kg)  11/29/22 276 lb 3.2 oz (125.3 kg)     GEN:  Well nourished, well developed in no acute distress HEENT: Normal NECK: No JVD; No carotid bruits CARDIAC: Irregular irregular, no murmurs, rubs, gallops RESPIRATORY: Diminished breath sounds at bases ABDOMEN: Soft, non-tender, non-distended MUSCULOSKELETAL:  1-2+ edema; No deformity  SKIN: Warm and dry NEUROLOGIC:  Alert and oriented x 3 PSYCHIATRIC:  Normal affect   ASSESSMENT:    1. Abnormal stress test   2. Coronary artery disease of native artery of native heart with stable angina pectoris (HCC)   3. Heart failure with preserved ejection fraction, unspecified HF chronicity (HCC)   4. Permanent atrial fibrillation (HCC)   5. Ascending aorta dilation (HCC)   6. Morbid obesity (HCC)    PLAN:    In order of problems listed above:  Dyspnea  on exertion, Lexiscan Myoview with inferolateral wall ischemia.  Echo 9/24 with normal EF 55 to 60%.  Schedule right and left heart cath.  Hold Pradaxa 48 hours prior to left heart cath.  Take aspirin 81 mg daily while Pradaxa is on hold. CAD/CABG x 3 1997, PCI to the left circumflex.  Abnormal stress test.  Right/left heart cath as above.  Cont Lipitor 20, Toprol-XL 100 mg daily, Pradaxa. HFpEF, edema still present, not compliant with torsemide as prescribed.  Compliance recommended.  Continue torsemide 40 mg daily.  Right heart cath as above. permanent atrial fibrillation, heart rate controlled.  Continue Toprol-XL and Pradaxa.   Mild Ascending aortic dilation, 4.1cm.  cont serial monitoring with CT scans obtained for lung cancer screening. Morbid obesity, start MVHQIO, patient has CAD, HFpEF.  Follow-up in 6-8 weeks.  Informed Consent   Shared Decision Making/Informed Consent The risks [stroke (1 in 1000), death (1 in 1000), kidney failure [usually temporary] (1 in 500), bleeding (1 in 200), allergic reaction [possibly serious] (1 in 200)], benefits (diagnostic support and management of coronary artery disease) and alternatives of a cardiac catheterization were discussed in detail with Mr. Sheldon and he is willing to proceed.      Medication Adjustments/Labs and Tests Ordered: Current medicines are reviewed at length with the patient today.  Concerns regarding medicines are outlined above.  Orders Placed This Encounter  Procedures   Basic Metabolic Panel (BMET)   CBC   EKG 12-Lead    Meds ordered this encounter  Medications   Semaglutide-Weight Management 0.25 MG/0.5ML SOAJ    Sig: Inject 0.25 mg into the skin once a week for 28 days.    Dispense:  2 mL    Refill:  0   Semaglutide-Weight Management 0.5 MG/0.5ML SOAJ    Sig: Inject 0.5 mg into the skin once a week for 28 days.    Dispense:  2 mL    Refill:  0   Semaglutide-Weight Management 1 MG/0.5ML SOAJ    Sig: Inject 1 mg  into the skin once a week for 28 days.    Dispense:  2 mL    Refill:  0   Semaglutide-Weight Management 1.7 MG/0.75ML SOAJ    Sig: Inject 1.7 mg into the skin once a week for 28 days.    Dispense:  3 mL    Refill:  0   Semaglutide-Weight Management 2.4 MG/0.75ML SOAJ    Sig: Inject 2.4 mg into the skin once a week.    Dispense:  3 mL    Refill:  3   aspirin EC 81 MG tablet    Sig: Take 1 tablet (81 mg total) by mouth daily. Swallow whole.     Patient Instructions  Medication Instructions:   START Aspirin - Take one tablet (81mg )  by mouth daily.   Start taking Wegovy  Month 1: 0.25 mg once a week.  Month 2: 0.5 mg once a week. (Call or send [place] a MyChart message when you are on week 2, so we can send your next dose in for you).  Month 3: 1 mg once a week.  Month 4: 1.7 mg once a week.  Month 5 and beyond: 2.4 mg once a week (maintenance dose)   *If you need a refill on your cardiac medications before your next appointment, please call your pharmacy*   Lab Work:  Your physician recommends labs - BMP / CBC  If you have labs (blood work) drawn today and your tests are completely normal, you will receive your results only by: MyChart Message (if you have MyChart) OR A  paper copy in the mail If you have any lab test that is abnormal or we need to change your treatment, we will call you to review the results.   Testing/Procedures:   [facility] [place] [facility] [place] [address] [place] [address] Dept: [phone] Loc: [phone]  [place]  01/23/2023  You are scheduled for a Cardiac Catheterization on Monday, October 28 with Dr. Lorine Bears.  1. Please arrive at the Heart & Vascular Center Entrance of [facility], [address], [place] [address] at 8:30 AM (This is 1 hour(s) prior to your procedure time).  Proceed to the Check-In Desk directly inside the  entrance.  Procedure Parking: Use the entrance off of the [address] side of the hospital. Turn right upon entering and follow the driveway to parking that is directly in front of the Heart & Vascular Center. There is no valet parking available at this entrance, however there is an awning directly in front of the Heart & Vascular Center for drop off/ pick up for patients.  Special note: Every effort is made to have your procedure done on time. Please understand that emergencies sometimes delay scheduled procedures.  2. Diet: Do not eat solid foods after midnight.  The patient may have clear liquids until 5am upon the day of the procedure.  3. Labs: You will need to have blood drawn TODAY  4. Medication instructions in preparation for your procedure:   Contrast Allergy: No  Stop taking Pradaxa (Dabigatran) on Friday, October 25.  HOLD Torsemide the morning if the procedure  On the morning of your procedure, take your Aspirin 81 mg and any morning medicines NOT listed above.  You may use sips of water.  5. Plan to go home the same day, you will only stay overnight if medically necessary. 6. Bring a current list of your medications and current insurance cards. 7. You MUST have a responsible person to drive you home. 8. Someone MUST be with you the first 24 hours after you arrive home or your discharge will be delayed. 9. Please wear clothes that are easy to get on and off and wear slip-on shoes.  Thank you for allowing [place] to care for you!   -- [place] [facility]    Follow-Up: At [facility], you and your health needs are our priority.  As part of our continuing mission to provide you with exceptional heart care, we have created designated Provider Care Teams.  These Care Teams include your primary Cardiologist (physician) and Advanced Practice Providers (APPs -  Physician Assistants and Nurse Practitioners) who all work together to provide you with  the care you need, when you need it.  We recommend signing up for the patient portal called "MyChart".  Sign up information is provided on this After Visit Summary.  MyChart is used to connect with patients for Virtual Visits (Telemedicine).  Patients are able to view lab/test results, encounter notes, upcoming appointments, etc.  Non-urgent messages can be sent to your provider as well.   To learn more about what you can do with MyChart, go to [email or web address].    Your next appointment:   6 - 8 Weeks  Provider:   You may see Debbe Odea, MD or one of the following Advanced Practice Providers on your designated Care Team:   Nicolasa Ducking, NP Eula Listen, PA-C Cadence Fransico Michael, PA-C Charlsie Quest, NP  Signed, Debbe Odea, MD  01/23/2023 10:14 AM    [facility]

## 2023-01-24 ENCOUNTER — Other Ambulatory Visit (HOSPITAL_COMMUNITY): Payer: Self-pay

## 2023-01-24 ENCOUNTER — Telehealth: Payer: Self-pay | Admitting: Pharmacy Technician

## 2023-01-24 LAB — BASIC METABOLIC PANEL
BUN/Creatinine Ratio: 21 (ref 10–24)
BUN: 20 mg/dL (ref 8–27)
CO2: 26 mmol/L (ref 20–29)
Calcium: 9.3 mg/dL (ref 8.6–10.2)
Chloride: 104 mmol/L (ref 96–106)
Creatinine, Ser: 0.97 mg/dL (ref 0.76–1.27)
Glucose: 100 mg/dL — ABNORMAL HIGH (ref 70–99)
Potassium: 3.9 mmol/L (ref 3.5–5.2)
Sodium: 143 mmol/L (ref 134–144)
eGFR: 79 mL/min/{1.73_m2} (ref >59)

## 2023-01-24 LAB — CBC
Hematocrit: 33.7 % — ABNORMAL LOW (ref 37.5–51.0)
Hemoglobin: 11.3 g/dL — ABNORMAL LOW (ref 13.0–17.7)
MCH: 32.1 pg (ref 26.6–33.0)
MCHC: 33.5 g/dL (ref 31.5–35.7)
MCV: 96 fL (ref 79–97)
Platelets: 128 10*3/uL — ABNORMAL LOW (ref 150–450)
RBC: 3.52 x10E6/uL — ABNORMAL LOW (ref 4.14–5.80)
RDW: 13.4 % (ref 11.6–15.4)
WBC: 5.4 10*3/uL (ref 3.4–10.8)

## 2023-01-24 NOTE — Telephone Encounter (Signed)
Pharmacy Patient Advocate Encounter   Received notification from CoverMyMeds that prior authorization for wegovy is required/requested.   Insurance verification completed.   The patient is insured through [employer] .   Per test claim: PA required; PA submitted to [facility] via CoverMyMeds Key/confirmation #/EOC [id number] Status is pending

## 2023-01-25 ENCOUNTER — Other Ambulatory Visit (HOSPITAL_COMMUNITY): Payer: Self-pay

## 2023-01-25 NOTE — Telephone Encounter (Signed)
Pharmacy Patient Advocate Encounter  Received notification from [facility] that Prior Authorization for wegovy has been APPROVED from 06/16/22 to 04/09/24   PA #/Case ID/Reference #: [id number]

## 2023-01-29 ENCOUNTER — Other Ambulatory Visit: Payer: Self-pay

## 2023-01-29 NOTE — Telephone Encounter (Signed)
Please advise on refill request.     Potassium 10 meq BID entered under historical provider not filled here before   Refill request is for Potassium 10 meg 2 tab BID.

## 2023-02-01 ENCOUNTER — Other Ambulatory Visit: Payer: Self-pay

## 2023-02-01 NOTE — Telephone Encounter (Signed)
Please advise on refill request.     Potassium 10 meq BID entered under historical provider not filled here before   Refill request is for Potassium 10 meg 2 tab BID.

## 2023-02-05 ENCOUNTER — Encounter
Admission: RE | Disposition: A | Discharge status: Home / Self Care | Payer: Self-pay | Source: Home / Self Care | Attending: Cardiovascular Disease

## 2023-02-05 ENCOUNTER — Encounter: Payer: Self-pay | Admitting: Cardiovascular Disease

## 2023-02-05 ENCOUNTER — Ambulatory Visit
Admission: RE | Admit: 2023-02-05 | Discharge: 2023-02-05 | Disposition: A | Discharge status: Home / Self Care | Payer: Medicare PPO | Attending: Cardiovascular Disease | Admitting: Cardiovascular Disease

## 2023-02-05 DIAGNOSIS — I714 Abdominal aortic aneurysm, without rupture, unspecified: Secondary | ICD-10-CM | POA: Insufficient documentation

## 2023-02-05 DIAGNOSIS — Z79899 Other long term (current) drug therapy: Secondary | ICD-10-CM | POA: Insufficient documentation

## 2023-02-05 DIAGNOSIS — Z7902 Long term (current) use of antithrombotics/antiplatelets: Secondary | ICD-10-CM | POA: Diagnosis not present

## 2023-02-05 DIAGNOSIS — I5032 Chronic diastolic (congestive) heart failure: Secondary | ICD-10-CM

## 2023-02-05 DIAGNOSIS — I4821 Permanent atrial fibrillation: Secondary | ICD-10-CM | POA: Diagnosis not present

## 2023-02-05 DIAGNOSIS — Z951 Presence of aortocoronary bypass graft: Secondary | ICD-10-CM | POA: Diagnosis not present

## 2023-02-05 DIAGNOSIS — Z6839 Body mass index (BMI) 39.0-39.9, adult: Secondary | ICD-10-CM | POA: Insufficient documentation

## 2023-02-05 DIAGNOSIS — Z7901 Long term (current) use of anticoagulants: Secondary | ICD-10-CM | POA: Insufficient documentation

## 2023-02-05 DIAGNOSIS — I110 Hypertensive heart disease with heart failure: Secondary | ICD-10-CM | POA: Diagnosis not present

## 2023-02-05 DIAGNOSIS — Z955 Presence of coronary angioplasty implant and graft: Secondary | ICD-10-CM | POA: Diagnosis not present

## 2023-02-05 DIAGNOSIS — I272 Pulmonary hypertension, unspecified: Secondary | ICD-10-CM | POA: Insufficient documentation

## 2023-02-05 DIAGNOSIS — Z87891 Personal history of nicotine dependence: Secondary | ICD-10-CM | POA: Insufficient documentation

## 2023-02-05 DIAGNOSIS — I2582 Chronic total occlusion of coronary artery: Secondary | ICD-10-CM | POA: Diagnosis not present

## 2023-02-05 DIAGNOSIS — J449 Chronic obstructive pulmonary disease, unspecified: Secondary | ICD-10-CM | POA: Diagnosis not present

## 2023-02-05 DIAGNOSIS — R9439 Abnormal result of other cardiovascular function study: Secondary | ICD-10-CM

## 2023-02-05 DIAGNOSIS — I25118 Atherosclerotic heart disease of native coronary artery with other forms of angina pectoris: Secondary | ICD-10-CM

## 2023-02-05 HISTORY — PX: RIGHT/LEFT HEART CATH AND CORONARY ANGIOGRAPHY: CATH118266

## 2023-02-05 LAB — POCT I-STAT EG7
Acid-Base Excess: 1 mmol/L (ref 0.0–2.0)
Bicarbonate: 26.8 mmol/L (ref 20.0–28.0)
Calcium, Ion: 1.24 mmol/L (ref 1.15–1.40)
HCT: 29 % — ABNORMAL LOW (ref 39.0–52.0)
Hemoglobin: 9.9 g/dL — ABNORMAL LOW (ref 13.0–17.0)
O2 Saturation: 67 %
Potassium: 3.7 mmol/L (ref 3.5–5.1)
Sodium: 142 mmol/L (ref 135–145)
TCO2: 28 mmol/L (ref 22–32)
pCO2, Ven: 44.7 mm[Hg] (ref 44–60)
pH, Ven: 7.385 (ref 7.25–7.43)
pO2, Ven: 35 mm[Hg] (ref 32–45)

## 2023-02-05 LAB — POCT I-STAT 7, (LYTES, BLD GAS, ICA,H+H)
Acid-Base Excess: 1 mmol/L (ref 0.0–2.0)
Bicarbonate: 26.6 mmol/L (ref 20.0–28.0)
Calcium, Ion: 1.26 mmol/L (ref 1.15–1.40)
HCT: 30 % — ABNORMAL LOW (ref 39.0–52.0)
Hemoglobin: 10.2 g/dL — ABNORMAL LOW (ref 13.0–17.0)
O2 Saturation: 90 %
Potassium: 3.7 mmol/L (ref 3.5–5.1)
Sodium: 143 mmol/L (ref 135–145)
TCO2: 28 mmol/L (ref 22–32)
pCO2 arterial: 44.7 mm[Hg] (ref 32–48)
pH, Arterial: 7.383 (ref 7.35–7.45)
pO2, Arterial: 61 mm[Hg] — ABNORMAL LOW (ref 83–108)

## 2023-02-05 SURGERY — RIGHT/LEFT HEART CATH AND CORONARY ANGIOGRAPHY
Anesthesia: Moderate Sedation | Laterality: Bilateral

## 2023-02-05 MED ORDER — FENTANYL CITRATE (PF) 100 MCG/2ML IJ SOLN
INTRAMUSCULAR | Status: DC | PRN
  Administered 2023-02-05: 25 ug via INTRAVENOUS

## 2023-02-05 MED ORDER — MIDAZOLAM HCL 2 MG/2ML IJ SOLN
INTRAMUSCULAR | Status: DC | PRN
  Administered 2023-02-05 (×2): 1 mg via INTRAVENOUS

## 2023-02-05 MED ORDER — HEPARIN SODIUM (PORCINE) 1000 UNIT/ML IJ SOLN
INTRAMUSCULAR | Status: DC | PRN
  Administered 2023-02-05: 6000 [IU] via INTRAVENOUS

## 2023-02-05 MED ORDER — VERAPAMIL HCL 2.5 MG/ML IV SOLN
INTRAVENOUS | Status: AC
  Filled 2023-02-05: qty 2

## 2023-02-05 MED ORDER — SODIUM CHLORIDE 0.9% FLUSH
3.0000 mL | INTRAVENOUS | Status: DC | PRN
Start: 2023-02-05 — End: 2023-02-05

## 2023-02-05 MED ORDER — HEPARIN SODIUM (PORCINE) 1000 UNIT/ML IJ SOLN
INTRAMUSCULAR | Status: AC
  Filled 2023-02-05: qty 10

## 2023-02-05 MED ORDER — HEPARIN (PORCINE) IN NACL 1000-0.9 UT/500ML-% IV SOLN
INTRAVENOUS | Status: AC
  Filled 2023-02-05: qty 1000

## 2023-02-05 MED ORDER — VERAPAMIL HCL 2.5 MG/ML IV SOLN
INTRAVENOUS | Status: DC | PRN
  Administered 2023-02-05 (×2): 2.5 mg via INTRAVENOUS

## 2023-02-05 MED ORDER — SODIUM CHLORIDE 0.9 % IV SOLN
INTRAVENOUS | Status: DC
  Administered 2023-02-05: 500 mL via INTRAVENOUS

## 2023-02-05 MED ORDER — MIDAZOLAM HCL 2 MG/2ML IJ SOLN
INTRAMUSCULAR | Status: AC
  Filled 2023-02-05: qty 2

## 2023-02-05 MED ORDER — SODIUM CHLORIDE 0.9% FLUSH: 3.0000 mL | Freq: Two times a day (BID) | INTRAVENOUS | Status: DC

## 2023-02-05 MED ORDER — SODIUM CHLORIDE 0.9 % IV SOLN: 250.0000 mL | INTRAVENOUS | Status: DC | PRN

## 2023-02-05 MED ORDER — HEPARIN (PORCINE) IN NACL 2000-0.9 UNIT/L-% IV SOLN
INTRAVENOUS | Status: DC | PRN
  Administered 2023-02-05: 1000 mL

## 2023-02-05 MED ORDER — IOHEXOL 300 MG/ML  SOLN
INTRAMUSCULAR | Status: DC | PRN
  Administered 2023-02-05: 109 mL

## 2023-02-05 MED ORDER — ONDANSETRON HCL 4 MG/2ML IJ SOLN: 4.0000 mg | Freq: Four times a day (QID) | INTRAMUSCULAR | Status: DC | PRN

## 2023-02-05 MED ORDER — ACETAMINOPHEN 325 MG PO TABS: 650.0000 mg | ORAL_TABLET | ORAL | Status: DC | PRN

## 2023-02-05 MED ORDER — FENTANYL CITRATE (PF) 100 MCG/2ML IJ SOLN
INTRAMUSCULAR | Status: AC
  Filled 2023-02-05: qty 2

## 2023-02-05 SURGICAL SUPPLY — 17 items
CATH BALLN WEDGE 5F 110CM (CATHETERS) IMPLANT
CATH INFINITI 5 FR 3DRC (CATHETERS) IMPLANT
CATH INFINITI 5FR MULTPACK ANG (CATHETERS) IMPLANT
CATH INFINITI AMBI 5FR TG (CATHETERS) IMPLANT
DEVICE RAD TR BAND REGULAR (VASCULAR PRODUCTS) IMPLANT
DRAPE BRACHIAL (DRAPES) IMPLANT
GLIDESHEATH SLEND SS 6F .021 (SHEATH) IMPLANT
GUIDEWIRE INQWIRE 1.5J.035X260 (WIRE) IMPLANT
INQWIRE 1.5J .035X260CM (WIRE) ×1
PACK CARDIAC CATH (CUSTOM PROCEDURE TRAY) ×1 IMPLANT
PAD SORBX EP SHIELD 16.5X12 (MISCELLANEOUS) IMPLANT
PROTECTION STATION PRESSURIZED (MISCELLANEOUS) ×1
SET ATX-X65L (MISCELLANEOUS) IMPLANT
SHEATH 6FR 85 DEST SLENDER (SHEATH) IMPLANT
SHEATH GLIDE SLENDER 4/5FR (SHEATH) IMPLANT
STATION PROTECTION PRESSURIZED (MISCELLANEOUS) IMPLANT
WIRE HITORQ VERSACORE ST 145CM (WIRE) IMPLANT

## 2023-02-05 NOTE — Interval H&P Note (Signed)
History and Physical Interval Note:  02/05/2023 9:51 AM  Leslie Prince  has presented today for surgery, with the diagnosis of L and R Cath   Abnormal stress test.  The various methods of treatment have been discussed with the patient and family. After consideration of risks, benefits and other options for treatment, the patient has consented to  Procedure(s): RIGHT/LEFT HEART CATH AND CORONARY ANGIOGRAPHY (Bilateral) as a surgical intervention.  The patient's history has been reviewed, patient examined, no change in status, stable for surgery.  I have reviewed the patient's chart and labs.  Questions were answered to the patient's satisfaction.     Lorine Bears

## 2023-02-06 ENCOUNTER — Other Ambulatory Visit: Payer: Self-pay

## 2023-02-06 ENCOUNTER — Encounter: Payer: Self-pay | Admitting: Cardiology

## 2023-02-06 ENCOUNTER — Encounter: Payer: Self-pay | Admitting: Cardiovascular Disease

## 2023-02-06 NOTE — Telephone Encounter (Signed)
Could you please advise on Potassium refill request.    Potassium 10 meq BID entered under historical provider not filled here before   Refill request is for Potassium 10 meg 2 tab BID.

## 2023-02-07 MED ORDER — POTASSIUM CHLORIDE CRYS ER 10 MEQ PO TBCR: 10.0000 meq | EXTENDED_RELEASE_TABLET | Freq: Two times a day (BID) | ORAL | 6 refills | Status: DC

## 2023-02-08 MED ORDER — POTASSIUM CHLORIDE CRYS ER 10 MEQ PO TBCR: 10.0000 meq | EXTENDED_RELEASE_TABLET | Freq: Two times a day (BID) | ORAL | 3 refills | Status: DC

## 2023-02-08 MED ORDER — TORSEMIDE 20 MG PO TABS: 40.0000 mg | ORAL_TABLET | Freq: Every day | ORAL | 3 refills | Status: AC

## 2023-02-19 ENCOUNTER — Ambulatory Visit: Payer: Medicare PPO | Attending: Cardiology | Admitting: Cardiology

## 2023-02-19 ENCOUNTER — Encounter: Payer: Self-pay | Admitting: Cardiology

## 2023-02-19 VITALS — BP 110/62 | HR 60 | Ht 69.0 in | Wt 269.4 lb

## 2023-02-19 DIAGNOSIS — I25118 Atherosclerotic heart disease of native coronary artery with other forms of angina pectoris: Secondary | ICD-10-CM | POA: Diagnosis not present

## 2023-02-19 DIAGNOSIS — I5030 Unspecified diastolic (congestive) heart failure: Secondary | ICD-10-CM | POA: Diagnosis not present

## 2023-02-19 DIAGNOSIS — I4821 Permanent atrial fibrillation: Secondary | ICD-10-CM | POA: Diagnosis not present

## 2023-02-19 DIAGNOSIS — I77810 Thoracic aortic ectasia: Secondary | ICD-10-CM | POA: Diagnosis not present

## 2023-02-19 NOTE — Progress Notes (Signed)
Cardiology Office Note:    Date:  02/19/2023   ID:  Leslie Prince, DOB [birth year], MRN [id number]  PCP:  Leslie Baseman, MD  [facility] Cardiologist:  Debbe Odea, MD  [facility] Electrophysiologist:  None   Referring MD: Leslie Baseman, MD   Chief Complaint  Patient presents with   Follow-up    Post cath f/u.  Patient would like to defer EKG today.    History of Present Illness:    Leslie Prince is a 77 y.o. male with a hx of chronic atrial fibrillation on Pradaxa, CAD PCI w/ DES to LCx 2011, CABG x 3 in 1997 (Left heart cath 01/2023 patent LIMA to LAD, patent left circumflex stent, occluded SVG to OM), mitral valve repair 1997, AAA s/p endovascular stent repair 11/2019, hypertension, former smoker, COPD who presents for follow-up.  Last seen with symptoms of shortness of breath, Lexiscan Myoview noted to be abnormal.  Underwent left heart cath showing patent LIMA to LAD, occluded SVG to OM.  Previously placed left circumflex stent was also patent.  Endorses adequate diuresis with torsemide.  Compliant with medications as prescribed.   Prior notes Left heart cath 01/2023 patent LIMA to LAD, patent left circumflex stent, occluded SVG to OM Echo 08/2021 EF 55 to 60%, normal functioning mitral valve prosthesis/ring. Patient had a recent abdominal CT showing abdominal aortic aneurysm up to 5 cm.  Endovascular repair by vascular surgery was performed on 11/27/2019.   Echocardiogram 11/2019 showed low normal ejection fraction, EF 50 to 55%.  Mild ascending aorta dilatation, 42 mm.   Past Medical History:  Diagnosis Date   Atrial fibrillation (HCC)    Cancer [facility])    prostate cancer   CHF (congestive heart failure) (HCC)    COPD (chronic obstructive pulmonary disease) (HCC)    Coronary artery disease    Depression    Hepatitis A infection    History of colon polyps    Hyperlipidemia    Hypertension    Mitral valve problem    Sleep apnea     Past Surgical  History:  Procedure Laterality Date   CARDIAC SURGERY     CARDIAC VALVE REPLACEMENT     CARDIOVERSION  2013   CATARACT EXTRACTION W/PHACO Right 08/06/2019   Procedure: CATARACT EXTRACTION PHACO AND INTRAOCULAR LENS PLACEMENT (IOC) RIGHT 12.34  01:25.4  14.5%;  Surgeon: Lockie Mola, MD;  Location: [facility];  Service: Ophthalmology;  Laterality: Right;   CATARACT EXTRACTION W/PHACO Left 08/27/2019   Procedure: CATARACT EXTRACTION PHACO AND INTRAOCULAR LENS PLACEMENT (IOC) LEFT 7.92 01:16.9 10.3%;  Surgeon: Lockie Mola, MD;  Location: [facility];  Service: Ophthalmology;  Laterality: Left;  sleep apnea   COLONOSCOPY N/A 09/28/2021   Procedure: COLONOSCOPY;  Surgeon: Toledo, Boykin Nearing, MD;  Location: [facility] ENDOSCOPY;  Service: Gastroenterology;  Laterality: N/A;  Cannot move up   COLONOSCOPY WITH PROPOFOL N/A 11/07/2017   Procedure: COLONOSCOPY WITH PROPOFOL;  Surgeon: Scot Jun, MD;  Location: [facility] ENDOSCOPY;  Service: Endoscopy;  Laterality: N/A;   CORONARY ANGIOPLASTY WITH STENT PLACEMENT  2011   CORONARY ARTERY BYPASS GRAFT     during mitral valve repair   ENDOVASCULAR REPAIR/STENT GRAFT N/A 11/26/2019   Procedure: ENDOVASCULAR REPAIR/STENT GRAFT;  Surgeon: Renford Dills, MD;  Location: [facility] INVASIVE CV LAB;  Service: Cardiovascular;  Laterality: N/A;   EYE SURGERY     LOWER EXTREMITY ANGIOGRAPHY N/A 08/29/2022   Procedure: Lower Extremity Angiography;  Surgeon: Renford Dills, MD;  Location: [facility] INVASIVE CV LAB;  Service: Cardiovascular;  Laterality: N/A;   MITRAL VALVE REPAIR  1997   RIGHT/LEFT HEART CATH AND CORONARY ANGIOGRAPHY Bilateral 02/05/2023   Procedure: RIGHT/LEFT HEART CATH AND CORONARY ANGIOGRAPHY;  Surgeon: Iran Ouch, MD;  Location: [facility] INVASIVE CV LAB;  Service: Cardiovascular;  Laterality: Bilateral;    Current Medications: Current Meds  Medication Sig   albuterol (PROVENTIL) (2.5 MG/3ML) 0.083% nebulizer  solution Take 2.5 mg by nebulization every 6 (six) hours as needed for wheezing.   albuterol (VENTOLIN HFA) 108 (90 Base) MCG/ACT inhaler Inhale 2 puffs into the lungs every 6 (six) hours as needed for wheezing.    allopurinol (ZYLOPRIM) 100 MG tablet Take 100 mg by mouth daily.   atorvastatin (LIPITOR) 20 MG tablet Take 20 mg by mouth at bedtime.    Calcium Carb-Cholecalciferol (OYSTER SHELL CALCIUM W/D) 500-5 MG-MCG TABS Take 1 tablet by mouth daily.   calcium carbonate (TUMS - DOSED IN MG ELEMENTAL CALCIUM) 500 MG chewable tablet Chew 1 tablet by mouth 2 (two) times daily.   colchicine 0.6 MG tablet Take 0.6 mg by mouth as directed. Take 2 tablets at onset of gout flare, then 1 tablet one hour later   CRANBERRY EXTRACT PO Take by mouth.   dabigatran (PRADAXA) 150 MG CAPS capsule Take 1 capsule (150 mg total) by mouth 2 (two) times daily.   enalapril (VASOTEC) 20 MG tablet Take 1 tablet (20 mg total) by mouth 2 (two) times daily.   Fluticasone-Salmeterol (ADVAIR) 250-50 MCG/DOSE AEPB Inhale 1 puff into the lungs 2 (two) times daily.    metoprolol succinate (TOPROL-XL) 100 MG 24 hr tablet Take 100 mg by mouth daily.   Misc Natural Products (TART CHERRY ADVANCED PO) Take by mouth daily.    montelukast (SINGULAIR) 10 MG tablet Take 10 mg by mouth at bedtime.    potassium chloride (KLOR-CON M) 10 MEQ tablet Take 1 tablet (10 mEq total) by mouth 2 (two) times daily.   sildenafil (VIAGRA) 100 MG tablet Take 1 tablet (100 mg total) by mouth daily as needed for erectile dysfunction.   tamsulosin (FLOMAX) 0.4 MG CAPS capsule Take 1 capsule (0.4 mg total) by mouth daily after supper.   tiotropium (SPIRIVA) 18 MCG inhalation capsule Place 18 mcg into inhaler and inhale daily.   torsemide (DEMADEX) 20 MG tablet Take 2 tablets (40 mg total) by mouth daily.     Allergies:   Lisinopril   Social History   Socioeconomic History   Marital status: Married    Spouse name: Not on file   Number of children:  Not on file   Years of education: Not on file   Highest education level: Not on file  Occupational History   Not on file  Tobacco Use   Smoking status: Former    Current packs/day: 0.00    Average packs/day: 1 pack/day for 25.0 years (25.0 ttl pk-yrs)    Types: Cigarettes    Start date: 06/21/1970    Quit date: 06/21/1995    Years since quitting: 27.6   Smokeless tobacco: Never  Vaping Use   Vaping status: Never Used  Substance and Sexual Activity   Alcohol use: Not Currently    Comment: Rare   Drug use: Never   Sexual activity: Yes    Birth control/protection: None  Other Topics Concern   Not on file  Social History Narrative   Lives with wife, Lanora Manis.  Pet, cat, in house.    Social Determinants  of Health   Financial Resource Strain: Low Risk  (11/28/2022)   Received from [facility]   Overall Financial Resource Strain (CARDIA)    Difficulty of Paying Living Expenses: Not hard at all  Food Insecurity: No Food Insecurity (11/28/2022)   Received from [facility]   Hunger Vital Sign    Worried About Running Out of Food in the Last Year: Never true    Ran Out of Food in the Last Year: Never true  Transportation Needs: No Transportation Needs (11/28/2022)   Received from [facility] - [facility]    In the past 12 months, has lack of transportation kept you from medical appointments or from getting medications?: No    Lack of Transportation (Non-Medical): No  Physical Activity: Not on file  Stress: Not on file  Social Connections: Not on file     Family History: The patient's family history includes Hypertension in his mother; Valvular heart disease in his father.  ROS:   Please see the history of present illness.     All other systems reviewed and are negative.  EKGs/Labs/Other Studies Reviewed:    The following studies were reviewed today:      Recent Labs: 01/23/2023: BUN 20; Creatinine, Ser  0.97; Platelets 128 02/05/2023: Hemoglobin 9.9; Potassium 3.7; Sodium 142  Recent Lipid Panel No results found for: "CHOL", "TRIG", "HDL", "CHOLHDL", "VLDL", "LDLCALC", "LDLDIRECT"  Physical Exam:    VS:  BP 110/62 (BP Location: Left Arm, Patient Position: Sitting, Cuff Size: Large)   Pulse 60   Ht 5\' 9"  (1.753 m)   Wt 269 lb 6.4 oz (122.2 kg)   SpO2 94%   BMI 39.78 kg/m     Wt Readings from Last 3 Encounters:  02/19/23 269 lb 6.4 oz (122.2 kg)  02/05/23 276 lb 1.6 oz (125.2 kg)  01/23/23 278 lb 3.2 oz (126.2 kg)     GEN:  Well nourished, well developed in no acute distress HEENT: Normal NECK: No JVD; No carotid bruits CARDIAC: Irregular irregular, no murmurs, rubs, gallops RESPIRATORY: Diminished breath sounds at bases ABDOMEN: Soft, non-tender, non-distended MUSCULOSKELETAL:  1+ edema; No deformity  SKIN: Warm and dry NEUROLOGIC:  Alert and oriented x 3 PSYCHIATRIC:  Normal affect   ASSESSMENT:    1. Coronary artery disease of native artery of native heart with stable angina pectoris (HCC)   2. Heart failure with preserved ejection fraction, unspecified HF chronicity (HCC)   3. Permanent atrial fibrillation (HCC)   4. Ascending aorta dilation (HCC)    PLAN:    In order of problems listed above:   CAD/CABG x 3 1997, PCI to the left circumflex.  LHC 10/24 patent LIMA to LAD, patent left circumflex stents occluded SVG to OM abnormal stress test.  Cont Lipitor 20, Toprol-XL 100 mg daily, Pradaxa. HFpEF, 7 pounds weight loss since last visit.  Compliant with torsemide as prescribed. Continue torsemide 40 mg daily.  Creatinine normal. permanent atrial fibrillation, heart rate controlled.  Continue Toprol-XL and Pradaxa.   Mild Ascending aortic dilation, 4.1cm.  cont serial monitoring with CT scans obtained for lung cancer screening.  Follow-up in 3 months.      Medication Adjustments/Labs and Tests Ordered: Current medicines are reviewed at length with the patient  today.  Concerns regarding medicines are outlined above.  No orders of the defined types were placed in this encounter.   No orders of the defined types were placed in this encounter.  Patient Instructions  Medication Instructions:   Your physician recommends that you continue on your current medications as directed. Please refer to the Current Medication list given to you today.  *If you need a refill on your cardiac medications before your next appointment, please call your pharmacy*   Lab Work:  None Ordered  If you have labs (blood work) drawn today and your tests are completely normal, you will receive your results only by: MyChart Message (if you have MyChart) OR A paper copy in the mail If you have any lab test that is abnormal or we need to change your treatment, we will call you to review the results.   Testing/Procedures:  None Ordered   Follow-Up: At [facility], you and your health needs are our priority.  As part of our continuing mission to provide you with exceptional heart care, we have created designated Provider Care Teams.  These Care Teams include your primary Cardiologist (physician) and Advanced Practice Providers (APPs -  Physician Assistants and Nurse Practitioners) who all work together to provide you with the care you need, when you need it.  We recommend signing up for the patient portal called "MyChart".  Sign up information is provided on this After Visit Summary.  MyChart is used to connect with patients for Virtual Visits (Telemedicine).  Patients are able to view lab/test results, encounter notes, upcoming appointments, etc.  Non-urgent messages can be sent to your provider as well.   To learn more about what you can do with MyChart, go to [email or web address].    Your next appointment:   3 - 4  month(s)  Provider:   You will see one of the following Advanced Practice Providers on your designated Care Team:   Nicolasa Ducking,  NP Eula Listen, PA-C Cadence Fransico Michael, PA-C Charlsie Quest, NP Carlos Levering, NP    Signed, Debbe Odea, MD  02/19/2023 11:24 AM    [facility]

## 2023-02-19 NOTE — Patient Instructions (Signed)
Medication Instructions:   Your physician recommends that you continue on your current medications as directed. Please refer to the Current Medication list given to you today.  *If you need a refill on your cardiac medications before your next appointment, please call your pharmacy*   Lab Work:  None Ordered  If you have labs (blood work) drawn today and your tests are completely normal, you will receive your results only by: MyChart Message (if you have MyChart) OR A paper copy in the mail If you have any lab test that is abnormal or we need to change your treatment, we will call you to review the results.   Testing/Procedures:  None Ordered   Follow-Up: At [facility], you and your health needs are our priority.  As part of our continuing mission to provide you with exceptional heart care, we have created designated Provider Care Teams.  These Care Teams include your primary Cardiologist (physician) and Advanced Practice Providers (APPs -  Physician Assistants and Nurse Practitioners) who all work together to provide you with the care you need, when you need it.  We recommend signing up for the patient portal called "MyChart".  Sign up information is provided on this After Visit Summary.  MyChart is used to connect with patients for Virtual Visits (Telemedicine).  Patients are able to view lab/test results, encounter notes, upcoming appointments, etc.  Non-urgent messages can be sent to your provider as well.   To learn more about what you can do with MyChart, go to [email or web address].    Your next appointment:   3 - 4  month(s)  Provider:   You will see one of the following Advanced Practice Providers on your designated Care Team:   Nicolasa Ducking, NP Eula Listen, PA-C Cadence Fransico Michael, PA-C Charlsie Quest, NP Carlos Levering, NP

## 2023-03-12 ENCOUNTER — Other Ambulatory Visit: Payer: Self-pay

## 2023-03-12 MED ORDER — ENALAPRIL MALEATE 20 MG PO TABS: 20.0000 mg | ORAL_TABLET | Freq: Two times a day (BID) | ORAL | 0 refills | Status: DC

## 2023-03-12 NOTE — Telephone Encounter (Signed)
Requested Prescriptions   Signed Prescriptions Disp Refills   enalapril (VASOTEC) 20 MG tablet 180 tablet 0    Sig: Take 1 tablet (20 mg total) by mouth 2 (two) times daily.    Authorizing Provider: Debbe Odea    Ordering User: Feliberto Harts L   last visit: 02/19/23 with plan to f/u in 3 months. next visit: 05/23/23

## 2023-03-19 ENCOUNTER — Ambulatory Visit: Payer: Medicare PPO | Admitting: Cardiology

## 2023-03-25 DIAGNOSIS — I9789 Other postprocedural complications and disorders of the circulatory system, not elsewhere classified: Secondary | ICD-10-CM | POA: Insufficient documentation

## 2023-03-25 NOTE — Progress Notes (Unsigned)
MRN : [id number]  Leslie Prince is a 77 y.o. (Mar 26, 1946) male who presents with chief complaint of check circulation.  History of Present Illness:   The patient returns to the office for surveillance of an abdominal aortic aneurysm status post stent graft placement on 11/26/2019.   Procedure: Placement of a 28 x 14 x 18 C3 Gore Excluder Endoprosthesis main body 16 x 14 contralateral limb and a 16 x 10 extender on the left  Subsequently he had a Type II endoleak with an expanding AAA sac.  On 08/29/2022 he had Coil embolization of the branches of the bilateral hypogastric artery feeding into the aneurysm sac using Ruby coils.  Patient denies abdominal pain or back pain, no other abdominal complaints.  No symptoms consistent with distal embolization No changes in claudication distance or new rest pain symptoms. No interval development of new ulcers or wounds  There have been no significant interval changes in his overall healthcare since his last visit.   Patient denies amaurosis fugax or TIA symptoms.  The patient denies recent episodes of angina or shortness of breath.   CT of the chest dated 12/27/2021 shows a 4.1 cm ascending aortic aneurysm.  Duplex US of the aorta and iliac arteries today shows a 4.9 AAA sac with no endoleak, decrease in the sac compared to the previous study.  ABI's dated 06/16/2021 Rt=1.16 and Lt=1.18  No outpatient medications have been marked as taking for the 03/26/23 encounter (Appointment) with Gilda Crease, Latina Craver, MD.    Past Medical History:  Diagnosis Date   Atrial fibrillation (HCC)    Cancer [facility])    prostate cancer   CHF (congestive heart failure) (HCC)    COPD (chronic obstructive pulmonary disease) (HCC)    Coronary artery disease    Depression    Hepatitis A infection    History of colon polyps    Hyperlipidemia    Hypertension    Mitral valve problem    Sleep apnea      Past Surgical History:  Procedure Laterality Date   CARDIAC SURGERY     CARDIAC VALVE REPLACEMENT     CARDIOVERSION  2013   CATARACT EXTRACTION W/PHACO Right 08/06/2019   Procedure: CATARACT EXTRACTION PHACO AND INTRAOCULAR LENS PLACEMENT (IOC) RIGHT 12.34  01:25.4  14.5%;  Surgeon: Lockie Mola, MD;  Location: [facility];  Service: Ophthalmology;  Laterality: Right;   CATARACT EXTRACTION W/PHACO Left 08/27/2019   Procedure: CATARACT EXTRACTION PHACO AND INTRAOCULAR LENS PLACEMENT (IOC) LEFT 7.92 01:16.9 10.3%;  Surgeon: Lockie Mola, MD;  Location: [facility]ERY [facility];  Service: Ophthalmology;  Laterality: Left;  sleep apnea   COLONOSCOPY N/A 09/28/2021   Procedure: COLONOSCOPY;  Surgeon: Toledo, Boykin Nearing, MD;  Location: [facility] ENDOSCOPY;  Service: Gastroenterology;  Laterality: N/A;  Cannot move up   COLONOSCOPY WITH PROPOFOL N/A 11/07/2017   Procedure: COLONOSCOPY WITH PROPOFOL;  Surgeon: Scot Jun, MD;  Location: [facility] ENDOSCOPY;  Service: Endoscopy;  Laterality: N/A;   CORONARY ANGIOPLASTY WITH STENT PLACEMENT  2011   CORONARY ARTERY BYPASS GRAFT     during mitral valve repair  ENDOVASCULAR REPAIR/STENT GRAFT N/A 11/26/2019   Procedure: ENDOVASCULAR REPAIR/STENT GRAFT;  Surgeon: Renford Dills, MD;  Location: [facility] INVASIVE CV LAB;  Service: Cardiovascular;  Laterality: N/A;   EYE SURGERY     LOWER EXTREMITY ANGIOGRAPHY N/A 08/29/2022   Procedure: Lower Extremity Angiography;  Surgeon: Renford Dills, MD;  Location: [facility] INVASIVE CV LAB;  Service: Cardiovascular;  Laterality: N/A;   MITRAL VALVE REPAIR  1997   RIGHT/LEFT HEART CATH AND CORONARY ANGIOGRAPHY Bilateral 02/05/2023   Procedure: RIGHT/LEFT HEART CATH AND CORONARY ANGIOGRAPHY;  Surgeon: Iran Ouch, MD;  Location: [facility] INVASIVE CV LAB;  Service: Cardiovascular;  Laterality: Bilateral;    Social History Social History   Tobacco Use   Smoking status: Former    Current  packs/day: 0.00    Average packs/day: 1 pack/day for 25.0 years (25.0 ttl pk-yrs)    Types: Cigarettes    Start date: 06/21/1970    Quit date: 06/21/1995    Years since quitting: 27.7   Smokeless tobacco: Never  Vaping Use   Vaping status: Never Used  Substance Use Topics   Alcohol use: Not Currently    Comment: Rare   Drug use: Never    Family History Family History  Problem Relation Age of Onset   Hypertension Mother    Valvular heart disease Father     Allergies  Allergen Reactions   Lisinopril Other (See Comments)    Severe urination Other reaction(s): Other (See Comments), Other (See Comments) Severe urination      REVIEW OF SYSTEMS (Negative unless checked)  Constitutional: [] Weight loss  [] Fever  [] Chills Cardiac: [] Chest pain   [] Chest pressure   [] Palpitations   [] Shortness of breath when laying flat   [] Shortness of breath with exertion. Vascular:  [x] Pain in legs with walking   [] Pain in legs at rest  [] History of DVT   [] Phlebitis   [] Swelling in legs   [] Varicose veins   [] Non-healing ulcers Pulmonary:   [] Uses home oxygen   [] Productive cough   [] Hemoptysis   [] Wheeze  [] COPD   [] Asthma Neurologic:  [] Dizziness   [] Seizures   [] History of stroke   [] History of TIA  [] Aphasia   [] Vissual changes   [] Weakness or numbness in arm   [] Weakness or numbness in leg Musculoskeletal:   [] Joint swelling   [] Joint pain   [] Low back pain Hematologic:  [] Easy bruising  [] Easy bleeding   [] Hypercoagulable state   [] Anemic Gastrointestinal:  [] Diarrhea   [] Vomiting  [] Gastroesophageal reflux/heartburn   [] Difficulty swallowing. Genitourinary:  [] Chronic kidney disease   [] Difficult urination  [] Frequent urination   [] Blood in urine Skin:  [] Rashes   [] Ulcers  Psychological:  [] History of anxiety   []  History of major depression.  Physical Examination  There were no vitals filed for this visit. There is no height or weight on file to calculate BMI. Gen: WD/WN, NAD Head:  [place]/AT, No temporalis wasting.  Ear/Nose/Throat: Hearing grossly intact, nares w/o erythema or drainage Eyes: PER, EOMI, sclera nonicteric.  Neck: Supple, no masses.  No bruit or JVD.  Pulmonary:  Good air movement, no audible wheezing, no use of accessory muscles.  Cardiac: RRR, normal S1, S2, no Murmurs. Vascular:  mild trophic changes, no open wounds Vessel Right Left  Radial Palpable Palpable  Gastrointestinal: soft, non-distended. No guarding/no peritoneal signs.  Musculoskeletal: M/S 5/5 throughout.  No visible deformity.  Neurologic: CN 2-12 intact. Pain and light touch intact in extremities.  Symmetrical.  Speech is fluent. Motor exam  as listed above. Psychiatric: Judgment intact, Mood & affect appropriate for pt's clinical situation. Dermatologic: No rashes or ulcers noted.  No changes consistent with cellulitis.   CBC Lab Results  Component Value Date   WBC 5.4 01/23/2023   HGB 9.9 (L) 02/05/2023   HCT 29.0 (L) 02/05/2023   MCV 96 01/23/2023   PLT 128 (L) 01/23/2023    BMET    Component Value Date/Time   NA 142 02/05/2023 1016   NA 143 01/23/2023 1023   K 3.7 02/05/2023 1016   CL 104 01/23/2023 1023   CO2 26 01/23/2023 1023   GLUCOSE 100 (H) 01/23/2023 1023   GLUCOSE 94 08/17/2021 1229   BUN 20 01/23/2023 1023   CREATININE 0.97 01/23/2023 1023   CALCIUM 9.3 01/23/2023 1023   GFRNONAA >60 08/29/2022 1121   GFRAA >60 11/27/2019 0545   CrCl cannot be calculated (Patient's most recent lab result is older than the maximum 21 days allowed.).  COAG Lab Results  Component Value Date   INR 2.0 (H) 11/19/2019   INR 1.7 (H) 11/15/2018    Radiology No results found.   Assessment/Plan 1. Infrarenal abdominal aortic aneurysm (AAA) without rupture (HCC) (Primary) Recommend:  Patient is status post successful endovascular repair of the AAA.   No further intervention is required at this time.   No endoleak is detected and the aneurysm sac is stable.  The patient  will continue antiplatelet therapy as prescribed as well as aggressive management of hyperlipidemia. Exercise is encouraged.   However, endografts require continued surveillance with ultrasound or CT scan. This is mandatory to detect any changes that allow repressurization of the aneurysm sac.  The patient is informed that this would be asymptomatic.  The patient is reminded that lifelong routine surveillance is a necessity with an endograft. Patient will continue to follow-up at the specified interval with ultrasound of the aorta. - VAS US AORTA/IVC/ILIACS; Future  2. Type II endoleak of aortic graft Recommend:  Patient is status post successful endovascular repair of the AAA.   No further intervention is required at this time.   No endoleak is detected and the aneurysm sac is stable.  The patient will continue antiplatelet therapy as prescribed as well as aggressive management of hyperlipidemia. Exercise is encouraged.   However, endografts require continued surveillance with ultrasound or CT scan. This is mandatory to detect any changes that allow repressurization of the aneurysm sac.  The patient is informed that this would be asymptomatic.  The patient is reminded that lifelong routine surveillance is a necessity with an endograft. Patient will continue to follow-up at the specified interval with ultrasound of the aorta. - VAS US AORTA/IVC/ILIACS; Future  3. Aneurysm of ascending aorta without rupture (HCC) Recommend:  No surgery or intervention is indicated at this time.  The patient has an asymptomatic thoracic aortic aneurysm that is less than 6.0 cm in maximal diameter.  I have discussed the natural history of thoracic aortic aneurysm and the small risk of rupture for aneurysm less than 6.5 cm in size.  However, as these small aneurysms tend to enlarge over time, continued surveillance with CT scan is mandatory.   I have also discussed optimizing medical management with  hypertension and lipid control and the negative effect that any tobacco products have on aneurysmal disease.  The patient is also encouraged to exercise a minimum of 30 minutes 4 times a week.   Should the patient develop new onset chest or back pain or signs of  peripheral embolization they are instructed to seek medical attention immediately and to alert the physician providing care that they have an aneurysm in the chest.   The patient voices their understanding.  The patient will return as ordered with a CT scan of the chest  4. PAD (peripheral artery disease) (HCC)  Recommend:  The patient has evidence of atherosclerosis of the lower extremities with claudication.  The patient does not voice lifestyle limiting changes at this point in time.  Noninvasive studies do not suggest clinically significant change.  No invasive studies, angiography or surgery at this time The patient should continue walking and begin a more formal exercise program.  The patient should continue antiplatelet therapy and aggressive treatment of the lipid abnormalities  No changes in the patient's medications at this time  Continued surveillance is indicated as atherosclerosis is likely to progress with time.    The patient will continue follow up with noninvasive studies as ordered.  - VAS [place] ABI WITH/WO TBI; Future  5. Coronary artery disease of native artery of native heart with stable angina pectoris (HCC) Continue cardiac and antihypertensive medications as already ordered and reviewed, no changes at this time.  Continue statin as ordered and reviewed, no changes at this time  Nitrates PRN for chest pain    [name], MD  03/25/2023 11:42 AM

## 2023-03-26 ENCOUNTER — Other Ambulatory Visit (INDEPENDENT_AMBULATORY_CARE_PROVIDER_SITE_OTHER): Payer: Self-pay | Admitting: Nurse Practitioner

## 2023-03-26 ENCOUNTER — Ambulatory Visit (INDEPENDENT_AMBULATORY_CARE_PROVIDER_SITE_OTHER): Payer: Medicare PPO

## 2023-03-26 ENCOUNTER — Encounter (INDEPENDENT_AMBULATORY_CARE_PROVIDER_SITE_OTHER): Payer: Self-pay | Admitting: Vascular Surgery

## 2023-03-26 ENCOUNTER — Ambulatory Visit (INDEPENDENT_AMBULATORY_CARE_PROVIDER_SITE_OTHER): Payer: Medicare PPO | Admitting: Vascular Surgery

## 2023-03-26 VITALS — BP 146/72 | HR 73 | Resp 18 | Ht 69.0 in | Wt 274.0 lb

## 2023-03-26 DIAGNOSIS — I739 Peripheral vascular disease, unspecified: Secondary | ICD-10-CM

## 2023-03-26 DIAGNOSIS — I7143 Infrarenal abdominal aortic aneurysm, without rupture: Secondary | ICD-10-CM

## 2023-03-26 DIAGNOSIS — I9789 Other postprocedural complications and disorders of the circulatory system, not elsewhere classified: Secondary | ICD-10-CM

## 2023-03-26 DIAGNOSIS — I7121 Aneurysm of the ascending aorta, without rupture: Secondary | ICD-10-CM

## 2023-03-26 DIAGNOSIS — I25118 Atherosclerotic heart disease of native coronary artery with other forms of angina pectoris: Secondary | ICD-10-CM

## 2023-05-23 ENCOUNTER — Encounter: Payer: Self-pay | Admitting: Cardiology

## 2023-05-23 ENCOUNTER — Ambulatory Visit: Payer: Medicare PPO | Attending: Cardiology | Admitting: Cardiology

## 2023-05-23 VITALS — BP 106/60 | HR 63 | Ht 69.0 in | Wt 276.4 lb

## 2023-05-23 DIAGNOSIS — I25118 Atherosclerotic heart disease of native coronary artery with other forms of angina pectoris: Secondary | ICD-10-CM

## 2023-05-23 DIAGNOSIS — I4821 Permanent atrial fibrillation: Secondary | ICD-10-CM

## 2023-05-23 DIAGNOSIS — I5030 Unspecified diastolic (congestive) heart failure: Secondary | ICD-10-CM | POA: Diagnosis not present

## 2023-05-23 NOTE — Progress Notes (Signed)
Cardiology Office Note:    Date:  05/23/2023   ID:  [name], DOB 1945-11-24, MRN [id number]  PCP:  Dorothey Baseman, MD  [facility] Cardiologist:  Debbe Odea, MD  [facility] Electrophysiologist:  None   Referring MD: Dorothey Baseman, MD   Chief Complaint  Patient presents with   Follow-up    Patient wife reports he is not sleeping well lately.  Also concerned with decreasing bp, 106/60 office check today.    History of Present Illness:    Leslie Prince is a 78 y.o. male with a hx of chronic atrial fibrillation on Pradaxa, CAD PCI w/ DES to LCx 2011, CABG x 3 in 1997 (Left heart cath 01/2023 patent LIMA to LAD, patent left circumflex stent, occluded SVG to OM), mitral valve repair 1997, AAA s/p endovascular stent repair 11/2019, hypertension, former smoker, COPD who presents for follow-up.  Patient endorses dyspnea with overexertion, also states not walking or exercising as much as he would like.  Has adequate diuresing with current dose of torsemide, has some lower extremity edema but will not want to take more fluid pills due to increased urination and disruption of his daily activities.  Denies any bleeding issues with taking Pradaxa.  Endorses eating high salt diet.    Prior notes Left heart cath 01/2023 patent LIMA to LAD, patent left circumflex stent, occluded SVG to OM Echo 08/2021 EF 55 to 60%, normal functioning mitral valve prosthesis/ring. Patient had a recent abdominal CT showing abdominal aortic aneurysm up to 5 cm.  Endovascular repair by vascular surgery was performed on 11/27/2019.   Echocardiogram 11/2019 showed low normal ejection fraction, EF 50 to 55%.  Mild ascending aorta dilatation, 42 mm.   Past Medical History:  Diagnosis Date   Atrial fibrillation (HCC)    Cancer [facility])    prostate cancer   CHF (congestive heart failure) (HCC)    COPD (chronic obstructive pulmonary disease) (HCC)    Coronary artery disease    Depression     Hepatitis A infection    History of colon polyps    Hyperlipidemia    Hypertension    Mitral valve problem    Sleep apnea     Past Surgical History:  Procedure Laterality Date   CARDIAC SURGERY     CARDIAC VALVE REPLACEMENT     CARDIOVERSION  2013   CATARACT EXTRACTION W/PHACO Right 08/06/2019   Procedure: CATARACT EXTRACTION PHACO AND INTRAOCULAR LENS PLACEMENT (IOC) RIGHT 12.34  01:25.4  14.5%;  Surgeon: Lockie Mola, MD;  Location: [facility];  Service: Ophthalmology;  Laterality: Right;   CATARACT EXTRACTION W/PHACO Left 08/27/2019   Procedure: CATARACT EXTRACTION PHACO AND INTRAOCULAR LENS PLACEMENT (IOC) LEFT 7.92 01:16.9 10.3%;  Surgeon: Lockie Mola, MD;  Location: [facility];  Service: Ophthalmology;  Laterality: Left;  sleep apnea   COLONOSCOPY N/A 09/28/2021   Procedure: COLONOSCOPY;  Surgeon: Toledo, Boykin Nearing, MD;  Location: [facility] ENDOSCOPY;  Service: Gastroenterology;  Laterality: N/A;  Cannot move up   COLONOSCOPY WITH PROPOFOL N/A 11/07/2017   Procedure: COLONOSCOPY WITH PROPOFOL;  Surgeon: Scot Jun, MD;  Location: [facility] ENDOSCOPY;  Service: Endoscopy;  Laterality: N/A;   CORONARY ANGIOPLASTY WITH STENT PLACEMENT  2011   CORONARY ARTERY BYPASS GRAFT     during mitral valve repair   ENDOVASCULAR REPAIR/STENT GRAFT N/A 11/26/2019   Procedure: ENDOVASCULAR REPAIR/STENT GRAFT;  Surgeon: Renford Dills, MD;  Location: [facility] INVASIVE CV LAB;  Service: Cardiovascular;  Laterality: N/A;  EYE SURGERY     LOWER EXTREMITY ANGIOGRAPHY N/A 08/29/2022   Procedure: Lower Extremity Angiography;  Surgeon: Renford Dills, MD;  Location: [facility] INVASIVE CV LAB;  Service: Cardiovascular;  Laterality: N/A;   MITRAL VALVE REPAIR  1997   RIGHT/LEFT HEART CATH AND CORONARY ANGIOGRAPHY Bilateral 02/05/2023   Procedure: RIGHT/LEFT HEART CATH AND CORONARY ANGIOGRAPHY;  Surgeon: Iran Ouch, MD;  Location: [facility] INVASIVE CV LAB;  Service:  Cardiovascular;  Laterality: Bilateral;    Current Medications: Current Meds  Medication Sig   albuterol (PROVENTIL) (2.5 MG/3ML) 0.083% nebulizer solution Take 2.5 mg by nebulization every 6 (six) hours as needed for wheezing.   albuterol (VENTOLIN HFA) 108 (90 Base) MCG/ACT inhaler Inhale 2 puffs into the lungs every 6 (six) hours as needed for wheezing.    allopurinol (ZYLOPRIM) 100 MG tablet Take 100 mg by mouth daily.   atorvastatin (LIPITOR) 20 MG tablet Take 20 mg by mouth at bedtime.    Calcium Carb-Cholecalciferol (OYSTER SHELL CALCIUM W/D) 500-5 MG-MCG TABS Take 1 tablet by mouth daily.   calcium carbonate (TUMS - DOSED IN MG ELEMENTAL CALCIUM) 500 MG chewable tablet Chew 1 tablet by mouth 2 (two) times daily.   colchicine 0.6 MG tablet Take 0.6 mg by mouth as directed. Take 2 tablets at onset of gout flare, then 1 tablet one hour later   CRANBERRY EXTRACT PO Take by mouth.   dabigatran (PRADAXA) 150 MG CAPS capsule Take 1 capsule (150 mg total) by mouth 2 (two) times daily.   enalapril (VASOTEC) 20 MG tablet Take 1 tablet (20 mg total) by mouth 2 (two) times daily.   Fluticasone-Salmeterol (ADVAIR) 250-50 MCG/DOSE AEPB Inhale 1 puff into the lungs 2 (two) times daily.    metoprolol succinate (TOPROL-XL) 100 MG 24 hr tablet Take 100 mg by mouth daily.   Misc Natural Products (TART CHERRY ADVANCED PO) Take by mouth daily.    montelukast (SINGULAIR) 10 MG tablet Take 10 mg by mouth at bedtime.    potassium chloride (KLOR-CON M) 10 MEQ tablet Take 1 tablet (10 mEq total) by mouth 2 (two) times daily.   sildenafil (VIAGRA) 100 MG tablet Take 1 tablet (100 mg total) by mouth daily as needed for erectile dysfunction.   tamsulosin (FLOMAX) 0.4 MG CAPS capsule Take 1 capsule (0.4 mg total) by mouth daily after supper.   tiotropium (SPIRIVA) 18 MCG inhalation capsule Place 18 mcg into inhaler and inhale daily.   torsemide (DEMADEX) 20 MG tablet Take 2 tablets (40 mg total) by mouth daily.      Allergies:   Lisinopril   Social History   Socioeconomic History   Marital status: Married    Spouse name: Not on file   Number of children: Not on file   Years of education: Not on file   Highest education level: Not on file  Occupational History   Not on file  Tobacco Use   Smoking status: Former    Current packs/day: 0.00    Average packs/day: 1 pack/day for 25.0 years (25.0 ttl pk-yrs)    Types: Cigarettes    Start date: 06/21/1970    Quit date: 06/21/1995    Years since quitting: 27.9   Smokeless tobacco: Never  Vaping Use   Vaping status: Never Used  Substance and Sexual Activity   Alcohol use: Not Currently    Comment: Rare   Drug use: Never   Sexual activity: Yes    Birth control/protection: None  Other Topics Concern  Not on file  Social History Narrative   Lives with wife, Lanora Manis.  Pet, cat, in house.    Social Drivers of Corporate investment banker Strain: Low Risk  (11/28/2022)   Received from [facility]   Overall Financial Resource Strain (CARDIA)    Difficulty of Paying Living Expenses: Not hard at all  Food Insecurity: No Food Insecurity (11/28/2022)   Received from [facility]   Hunger Vital Sign    Worried About Running Out of Food in the Last Year: Never true    Ran Out of Food in the Last Year: Never true  Transportation Needs: No Transportation Needs (11/28/2022)   Received from [facility] - Transportation    In the past 12 months, has lack of transportation kept you from medical appointments or from getting medications?: No    Lack of Transportation (Non-Medical): No  Physical Activity: Not on file  Stress: Not on file  Social Connections: Not on file     Family History: The patient's family history includes Hypertension in his mother; Valvular heart disease in his father.  ROS:   Please see the history of present illness.     All other systems reviewed and are  negative.  EKGs/Labs/Other Studies Reviewed:    The following studies were reviewed today:  EKG Interpretation Date/Time:  Wednesday May 23 2023 11:48:54 EST Ventricular Rate:  63 PR Interval:    QRS Duration:  98 QT Interval:  454 QTC Calculation: 464 R Axis:   8  Text Interpretation: Atrial fibrillation with a competing junctional pacemaker Nonspecific ST abnormality Confirmed by Debbe Odea ([id number]) on 05/23/2023 12:05:54 PM   Recent Labs: 01/23/2023: BUN 20; Creatinine, Ser 0.97; Platelets 128 02/05/2023: Hemoglobin 9.9; Potassium 3.7; Sodium 142  Recent Lipid Panel No results found for: "CHOL", "TRIG", "HDL", "CHOLHDL", "VLDL", "LDLCALC", "LDLDIRECT"  Physical Exam:    VS:  BP 106/60 (BP Location: Left Arm, Patient Position: Sitting, Cuff Size: Large)   Pulse 63   Ht 5\' 9"  (1.753 m)   Wt 276 lb 6.4 oz (125.4 kg)   SpO2 93%   BMI 40.82 kg/m     Wt Readings from Last 3 Encounters:  05/23/23 276 lb 6.4 oz (125.4 kg)  03/26/23 274 lb (124.3 kg)  02/19/23 269 lb 6.4 oz (122.2 kg)     GEN:  Well nourished, well developed in no acute distress HEENT: Normal NECK: No JVD; No carotid bruits CARDIAC: Irregular irregular, no murmurs, rubs, gallops RESPIRATORY: Diminished breath sounds at bases ABDOMEN: Soft, non-tender, non-distended MUSCULOSKELETAL:  1+ edema; No deformity  SKIN: Warm and dry NEUROLOGIC:  Alert and oriented x 3 PSYCHIATRIC:  Normal affect   ASSESSMENT:    1. Coronary artery disease of native artery of native heart with stable angina pectoris (HCC)   2. Heart failure with preserved ejection fraction, unspecified HF chronicity (HCC)   3. Permanent atrial fibrillation (HCC)   4. Morbid obesity (HCC)    PLAN:    In order of problems listed above:   CAD/CABG x 3 1997, PCI to the left circumflex.  LHC 10/24 patent LIMA to LAD, patent left circumflex stents occluded SVG to OM abnormal stress test.  Cont Lipitor 20, Toprol-XL 100 mg daily,  Pradaxa. HFpEF, 1+ edema.  Low-salt diet advised, declines higher dose of torsemide.  Continue torsemide 40 mg daily.   permanent atrial fibrillation, heart rate controlled.  Continue Toprol-XL and Pradaxa.   Morbid  obesity, low-calorie diet, weight loss, increase activity advised.  Follow-up in 6 months.      Medication Adjustments/Labs and Tests Ordered: Current medicines are reviewed at length with the patient today.  Concerns regarding medicines are outlined above.  Orders Placed This Encounter  Procedures   EKG 12-Lead    No orders of the defined types were placed in this encounter.    Patient Instructions  Medication Instructions:   Your physician recommends that you continue on your current medications as directed. Please refer to the Current Medication list given to you today.  *If you need a refill on your cardiac medications before your next appointment, please call your pharmacy*   Lab Work:  None Ordered  If you have labs (blood work) drawn today and your tests are completely normal, you will receive your results only by: MyChart Message (if you have MyChart) OR A paper copy in the mail If you have any lab test that is abnormal or we need to change your treatment, we will call you to review the results.   Testing/Procedures:  None Ordered    Follow-Up: At [facility], you and your health needs are our priority.  As part of our continuing mission to provide you with exceptional heart care, we have created designated Provider Care Teams.  These Care Teams include your primary Cardiologist (physician) and Advanced Practice Providers (APPs -  Physician Assistants and Nurse Practitioners) who all work together to provide you with the care you need, when you need it.  We recommend signing up for the patient portal called "MyChart".  Sign up information is provided on this After Visit Summary.  MyChart is used to connect with patients for Virtual Visits  (Telemedicine).  Patients are able to view lab/test results, encounter notes, upcoming appointments, etc.  Non-urgent messages can be sent to your provider as well.   To learn more about what you can do with MyChart, go to [email or web address].    Your next appointment:   6 month(s)  Provider:   You may see Debbe Odea, MD or one of the following Advanced Practice Providers on your designated Care Team:   Nicolasa Ducking, NP Eula Listen, PA-C Cadence Fransico Michael, PA-C Charlsie Quest, NP Carlos Levering, NP    Signed, Debbe Odea, MD  05/23/2023 1:31 PM    [facility]

## 2023-05-23 NOTE — Patient Instructions (Signed)
Medication Instructions:   Your physician recommends that you continue on your current medications as directed. Please refer to the Current Medication list given to you today.  *If you need a refill on your cardiac medications before your next appointment, please call your pharmacy*   Lab Work:  None Ordered  If you have labs (blood work) drawn today and your tests are completely normal, you will receive your results only by: MyChart Message (if you have MyChart) OR A paper copy in the mail If you have any lab test that is abnormal or we need to change your treatment, we will call you to review the results.   Testing/Procedures:  None Ordered   Follow-Up: At [facility], you and your health needs are our priority.  As part of our continuing mission to provide you with exceptional heart care, we have created designated Provider Care Teams.  These Care Teams include your primary Cardiologist (physician) and Advanced Practice Providers (APPs -  Physician Assistants and Nurse Practitioners) who all work together to provide you with the care you need, when you need it.  We recommend signing up for the patient portal called "MyChart".  Sign up information is provided on this After Visit Summary.  MyChart is used to connect with patients for Virtual Visits (Telemedicine).  Patients are able to view lab/test results, encounter notes, upcoming appointments, etc.  Non-urgent messages can be sent to your provider as well.   To learn more about what you can do with MyChart, go to [email or web address].    Your next appointment:   6 month(s)  Provider:   You may see Debbe Odea, MD or one of the following Advanced Practice Providers on your designated Care Team:   Nicolasa Ducking, NP Eula Listen, PA-C Cadence Fransico Michael, PA-C Charlsie Quest, NP Carlos Levering, NP

## 2023-05-28 ENCOUNTER — Other Ambulatory Visit: Payer: Self-pay | Admitting: *Deleted

## 2023-05-28 DIAGNOSIS — I4821 Permanent atrial fibrillation: Secondary | ICD-10-CM

## 2023-05-28 MED ORDER — DABIGATRAN ETEXILATE MESYLATE 150 MG PO CAPS: 150.0000 mg | ORAL_CAPSULE | Freq: Two times a day (BID) | ORAL | 1 refills | Status: DC

## 2023-05-28 NOTE — Telephone Encounter (Signed)
 Pradaxa 150mg  refill request received. Pt is 78 years old, weight-125.4kg, Crea-0.97 on 01/23/23, last seen by Dr. Azucena Cecil on 05/23/23, Diagnosis-Afib, CrCl-109.53 mL/min; Dose is appropriate based on dosing criteria. Will send in refill to requested pharmacy.

## 2023-06-11 ENCOUNTER — Other Ambulatory Visit: Payer: Self-pay | Admitting: *Deleted

## 2023-06-11 MED ORDER — ENALAPRIL MALEATE 20 MG PO TABS: 20.0000 mg | ORAL_TABLET | Freq: Two times a day (BID) | ORAL | 3 refills | Status: AC

## 2023-06-14 ENCOUNTER — Ambulatory Visit: Payer: Medicare PPO | Admitting: Urology

## 2023-06-14 VITALS — BP 120/63 | HR 80 | Ht 69.0 in | Wt 269.0 lb

## 2023-06-14 DIAGNOSIS — R399 Unspecified symptoms and signs involving the genitourinary system: Secondary | ICD-10-CM | POA: Diagnosis not present

## 2023-06-14 DIAGNOSIS — C61 Malignant neoplasm of prostate: Secondary | ICD-10-CM | POA: Diagnosis not present

## 2023-06-14 MED ORDER — TAMSULOSIN HCL 0.4 MG PO CAPS: 0.4000 mg | ORAL_CAPSULE | Freq: Every day | ORAL | 3 refills | Status: AC

## 2023-06-14 NOTE — Progress Notes (Signed)
   06/14/2023 2:28 PM   [name] 1946-02-09 [id number]  Reason for visit: Follow up prostate cancer, OAB, ED  HPI: He is a 78 year old male with morbid obesity BMI 40 who was diagnosed with high risk prostate cancer in December 2019 for an abnormal DRE with a PSA of 5.9.  He ultimately underwent treatment with 2 years of ADT and external beam radiation.  Last dose of ADT was in July 2021, and he completed 2 years total.  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  He has overall been doing well.  PSA remains undetectable, and was most recently checked on 10/13/2022 indicating excellent cancer control.  We discussed the need to continue to follow the PSA on a yearly basis.  He really denies any significant urinary symptoms today, he continues on Flomax.  We discussed he could trial off the Flomax to see if his urinary symptoms worsen at all.  Flomax was refilled and he would like to continue for the time being.  He takes torsemide which as expected causes urinary frequency.  In terms of erections, he is no longer sexually active, and does not need a refill on the sildenafil.  Flomax refilled, okay to trial off that medication if patient desires RTC 1 year PSA prior(prefers to have PSA done through PCP)   Sondra Come, MD  [facility] [address], [address] [place], [place] [address] [phone]

## 2023-07-04 ENCOUNTER — Ambulatory Visit: Payer: Medicare PPO | Admitting: Cardiology

## 2023-08-28 ENCOUNTER — Encounter (INDEPENDENT_AMBULATORY_CARE_PROVIDER_SITE_OTHER): Payer: Self-pay

## 2023-09-27 ENCOUNTER — Other Ambulatory Visit (INDEPENDENT_AMBULATORY_CARE_PROVIDER_SITE_OTHER): Payer: Medicare PPO

## 2023-09-27 ENCOUNTER — Ambulatory Visit (INDEPENDENT_AMBULATORY_CARE_PROVIDER_SITE_OTHER): Payer: Medicare PPO | Admitting: Vascular Surgery

## 2023-09-27 ENCOUNTER — Encounter (INDEPENDENT_AMBULATORY_CARE_PROVIDER_SITE_OTHER): Payer: Medicare PPO

## 2023-09-28 ENCOUNTER — Other Ambulatory Visit: Payer: Self-pay | Admitting: Family Medicine

## 2023-09-28 DIAGNOSIS — M5416 Radiculopathy, lumbar region: Secondary | ICD-10-CM

## 2023-10-02 ENCOUNTER — Telehealth: Payer: Self-pay

## 2023-10-02 ENCOUNTER — Ambulatory Visit
Admission: RE | Admit: 2023-10-02 | Discharge: 2023-10-02 | Disposition: A | Discharge status: Home / Self Care | Source: Ambulatory Visit | Attending: Family Medicine | Admitting: Family Medicine

## 2023-10-02 DIAGNOSIS — M5416 Radiculopathy, lumbar region: Secondary | ICD-10-CM

## 2023-10-02 NOTE — Telephone Encounter (Signed)
 Attempted to call patient/wife to make sure he was on his way, no answer.  WENDI Dixon CMA

## 2023-10-02 NOTE — Telephone Encounter (Signed)
 Called and spoke with patient to have him come back in to have some additional images done (per radiologist) today. He will be coming back in within the next 45 mins.  WENDI Dixon CMA

## 2023-10-05 ENCOUNTER — Other Ambulatory Visit (INDEPENDENT_AMBULATORY_CARE_PROVIDER_SITE_OTHER): Payer: Self-pay | Admitting: Vascular Surgery

## 2023-10-05 DIAGNOSIS — I714 Abdominal aortic aneurysm, without rupture, unspecified: Secondary | ICD-10-CM

## 2023-10-07 NOTE — Progress Notes (Unsigned)
 MRN : [id number]  Leslie Prince is a 78 y.o. ([birth year]) male who presents with chief complaint of check circulation.  History of Present Illness: The patient returns to the office for surveillance of an abdominal aortic aneurysm status post stent graft placement on 11/26/2019.    Procedure: Placement of a 28 x 14 x 18 C3 Gore Excluder Endoprosthesis main body 16 x 14 contralateral limb and a 16 x 10 extender on the left   Subsequently he had a Type II endoleak with an expanding AAA sac.  On 08/29/2022 he had Coil embolization of the branches of the bilateral hypogastric artery feeding into the aneurysm sac using Ruby coils.   Patient denies abdominal pain or back pain, no other abdominal complaints.  No symptoms consistent with distal embolization No changes in claudication distance or new rest pain symptoms. No interval development of new ulcers or wounds   There have been no significant interval changes in his overall healthcare since his last visit.    Patient denies amaurosis fugax or TIA symptoms.  The patient denies recent episodes of angina or shortness of breath.    CT of the chest dated 12/27/2021 shows a 4.1 cm ascending aortic aneurysm.   Duplex US  of the aorta and iliac arteries today shows a 4.9 AAA sac with no endoleak, stable sac compared to the previous study.   ABI's dated 06/16/2021 Rt=1.16 and Lt=1.18  No outpatient medications have been marked as taking for the 10/08/23 encounter (Appointment) with Jama, Cordella MATSU, MD.    Past Medical History:  Diagnosis Date   Atrial fibrillation (HCC)    Cancer [facility])    prostate cancer   CHF (congestive heart failure) (HCC)    COPD (chronic obstructive pulmonary disease) (HCC)    Coronary artery disease    Depression    Hepatitis A infection    History of colon polyps    Hyperlipidemia    Hypertension    Mitral valve problem    Sleep apnea      Past Surgical History:  Procedure Laterality Date   CARDIAC SURGERY     CARDIAC VALVE REPLACEMENT     CARDIOVERSION  2013   CATARACT EXTRACTION W/PHACO Right 08/06/2019   Procedure: CATARACT EXTRACTION PHACO AND INTRAOCULAR LENS PLACEMENT (IOC) RIGHT 12.34  01:25.4  14.5%;  Surgeon: Mittie Gaskin, MD;  Location: [facility];  Service: Ophthalmology;  Laterality: Right;   CATARACT EXTRACTION W/PHACO Left 08/27/2019   Procedure: CATARACT EXTRACTION PHACO AND INTRAOCULAR LENS PLACEMENT (IOC) LEFT 7.92 01:16.9 10.3%;  Surgeon: Mittie Gaskin, MD;  Location: [facility];  Service: Ophthalmology;  Laterality: Left;  sleep apnea   COLONOSCOPY N/A 09/28/2021   Procedure: COLONOSCOPY;  Surgeon: Toledo, Ladell POUR, MD;  Location: [facility] ENDOSCOPY;  Service: Gastroenterology;  Laterality: N/A;  Cannot move up   COLONOSCOPY WITH PROPOFOL  N/A 11/07/2017   Procedure: COLONOSCOPY WITH PROPOFOL ;  Surgeon: Viktoria Lamar DASEN, MD;  Location: [facility] ENDOSCOPY;  Service: Endoscopy;  Laterality: N/A;   CORONARY ANGIOPLASTY WITH STENT PLACEMENT  2011   CORONARY ARTERY BYPASS GRAFT  during mitral valve repair   ENDOVASCULAR REPAIR/STENT GRAFT N/A 11/26/2019   Procedure: ENDOVASCULAR REPAIR/STENT GRAFT;  Surgeon: Jama Cordella MATSU, MD;  Location: [facility] INVASIVE CV LAB;  Service: Cardiovascular;  Laterality: N/A;   EYE SURGERY     LOWER EXTREMITY ANGIOGRAPHY N/A 08/29/2022   Procedure: Lower Extremity Angiography;  Surgeon: Jama Cordella MATSU, MD;  Location: [facility] INVASIVE CV LAB;  Service: Cardiovascular;  Laterality: N/A;   MITRAL VALVE REPAIR  1997   RIGHT/LEFT HEART CATH AND CORONARY ANGIOGRAPHY Bilateral 02/05/2023   Procedure: RIGHT/LEFT HEART CATH AND CORONARY ANGIOGRAPHY;  Surgeon: Darron Deatrice LABOR, MD;  Location: [facility] INVASIVE CV LAB;  Service: Cardiovascular;  Laterality: Bilateral;    Social History Social History   Tobacco Use   Smoking status: Former    Current  packs/day: 0.00    Average packs/day: 1 pack/day for 25.0 years (25.0 ttl pk-yrs)    Types: Cigarettes    Start date: 06/21/1970    Quit date: 06/21/1995    Years since quitting: 28.3   Smokeless tobacco: Never  Vaping Use   Vaping status: Never Used  Substance Use Topics   Alcohol use: Not Currently    Comment: Rare   Drug use: Never    Family History Family History  Problem Relation Age of Onset   Hypertension Mother    Valvular heart disease Father     No Active Allergies   REVIEW OF SYSTEMS (Negative unless checked)  Constitutional: [] Weight loss  [] Fever  [] Chills Cardiac: [] Chest pain   [] Chest pressure   [] Palpitations   [] Shortness of breath when laying flat   [] Shortness of breath with exertion. Vascular:  [x] Pain in legs with walking   [] Pain in legs at rest  [] History of DVT   [] Phlebitis   [] Swelling in legs   [] Varicose veins   [] Non-healing ulcers Pulmonary:   [] Uses home oxygen   [] Productive cough   [] Hemoptysis   [] Wheeze  [] COPD   [] Asthma Neurologic:  [] Dizziness   [] Seizures   [] History of stroke   [] History of TIA  [] Aphasia   [] Vissual changes   [] Weakness or numbness in arm   [] Weakness or numbness in leg Musculoskeletal:   [] Joint swelling   [] Joint pain   [] Low back pain Hematologic:  [] Easy bruising  [] Easy bleeding   [] Hypercoagulable state   [] Anemic Gastrointestinal:  [] Diarrhea   [] Vomiting  [] Gastroesophageal reflux/heartburn   [] Difficulty swallowing. Genitourinary:  [] Chronic kidney disease   [] Difficult urination  [] Frequent urination   [] Blood in urine Skin:  [] Rashes   [] Ulcers  Psychological:  [] History of anxiety   []  History of major depression.  Physical Examination  There were no vitals filed for this visit. There is no height or weight on file to calculate BMI. Gen: WD/WN, NAD Head: [place]/AT, No temporalis wasting.  Ear/Nose/Throat: Hearing grossly intact, nares w/o erythema or drainage Eyes: PER, EOMI, sclera nonicteric.  Neck:  Supple, no masses.  No bruit or JVD.  Pulmonary:  Good air movement, no audible wheezing, no use of accessory muscles.  Cardiac: RRR, normal S1, S2, no Murmurs. Vascular:  mild trophic changes, no open wounds Vessel Right Left  Radial Palpable Palpable  Gastrointestinal: soft, non-distended. No guarding/no peritoneal signs.  Musculoskeletal: M/S 5/5 throughout.  No visible deformity.  Neurologic: CN 2-12 intact. Pain and light touch intact in extremities.  Symmetrical.  Speech is fluent. Motor exam as listed above. Psychiatric: Judgment intact, Mood & affect appropriate for pt's clinical situation. Dermatologic: No rashes or ulcers  noted.  No changes consistent with cellulitis.   CBC Lab Results  Component Value Date   WBC 5.4 01/23/2023   HGB 9.9 (L) 02/05/2023   HCT 29.0 (L) 02/05/2023   MCV 96 01/23/2023   PLT 128 (L) 01/23/2023    BMET    Component Value Date/Time   NA 142 02/05/2023 1016   NA 143 01/23/2023 1023   K 3.7 02/05/2023 1016   CL 104 01/23/2023 1023   CO2 26 01/23/2023 1023   GLUCOSE 100 (H) 01/23/2023 1023   GLUCOSE 94 08/17/2021 1229   BUN 20 01/23/2023 1023   CREATININE 0.97 01/23/2023 1023   CALCIUM  9.3 01/23/2023 1023   GFRNONAA >60 08/29/2022 1121   GFRAA >60 11/27/2019 0545   CrCl cannot be calculated (Patient's most recent lab result is older than the maximum 21 days allowed.).  COAG Lab Results  Component Value Date   INR 2.0 (H) 11/19/2019   INR 1.7 (H) 11/15/2018    Radiology MR LUMBAR SPINE WO CONTRAST Addendum Date: 10/03/2023 ADDENDUM REPORT: 10/03/2023 10:45 ADDENDUM: Patient return for more complete T2 imaging. Initial T2 axial imaging did not include the L2-3 disc levels or above. Additional images, while important for completeness, do not change the initial interpretation. Electronically Signed   By: Oneil Officer M.D.   On: 10/03/2023 10:45   Addendum Date: 10/02/2023 ADDENDUM REPORT: 10/02/2023 13:43 ADDENDUM: Axial T2 images do  not include the stenotic L2-3 level in the patient will be asked to return so that can be performed. Electronically Signed   By: Oneil Officer M.D.   On: 10/02/2023 13:43   Result Date: 10/03/2023 CLINICAL DATA:  Lumbar radiculitis.  Back pain.  Hip pain. EXAM: MRI LUMBAR SPINE WITHOUT CONTRAST TECHNIQUE: Multiplanar, multisequence MR imaging of the lumbar spine was performed. No intravenous contrast was administered. COMPARISON:  CT 07/21/2022 FINDINGS: Segmentation:  5 lumbar type vertebral bodies. Alignment: Scoliotic curvature convex to the left. 1-2 mm anterolisthesis L4-5. Vertebrae: Acute or subacute superior endplate fracture at L5 with loss of height of less than 20%. No retropulsion. This looks like a benign compression fracture. This was not present in April of 2024. No other focal bone finding. Conus medullaris and cauda equina: Conus extends to the L1 level. Conus and cauda equina appear normal. Paraspinal and other soft tissues: Vascular stent graft as seen previously, not primarily or completely evaluated. Disc levels: T12-L1: Normal L1-2: Endplate osteophytes and bulging of the disc. Mild canal stenosis but no likely neural compression. L2-3: Endplate osteophytes and bulging of the disc. Mild facet and ligamentous hypertrophy. Moderate multifactorial stenosis at this level that could be symptomatic. L3-4: Endplate osteophytes and bulging of the disc more prominent towards the left. Facet and ligamentous hypertrophy. Stenosis of the lateral recesses left more than right. Some potential for neural compression in the left lateral recess. L4-5: Facet degeneration and hypertrophy with 1-2 mm of anterolisthesis. Bulging of the disc. Severe multifactorial spinal stenosis that could cause neural compression on either or both sides. Moderate foraminal stenosis on the left. L5-S1: No disc abnormality. Mild facet hypertrophy but no likely neural compression. IMPRESSION: 1. Acute or subacute superior endplate  fracture at L5 with loss of height of less than 20%. No retropulsion. This looks like a benign compression fracture. This was not present in April of 2024 and could be a cause of regional pain. 2. L2-3: Moderate multifactorial stenosis that could be symptomatic. 3. L3-4: Lateral recess stenosis left more than right that could cause  neural compression on the left. 4. L4-5: Severe multifactorial spinal stenosis that could cause neural compression on either or both sides. Moderate foraminal stenosis on the left. Electronically Signed: By: Oneil Officer M.D. On: 10/02/2023 11:40     Assessment/Plan 1. Infrarenal abdominal aortic aneurysm (AAA) without rupture (HCC) (Primary) Recommend:  Patient is status post successful endovascular repair of the AAA.    No further intervention is required at this time.   No endoleak is detected and the aneurysm sac is stable.   The patient will continue antiplatelet therapy as prescribed as well as aggressive management of hyperlipidemia. Exercise is encouraged.    However, endografts require continued surveillance with ultrasound or CT scan. This is mandatory to detect any changes that allow repressurization of the aneurysm sac.  The patient is informed that this would be asymptomatic.   The patient is reminded that lifelong routine surveillance is a necessity with an endograft. Patient will continue to follow-up at the specified interval with ultrasound of the aorta. - VAS US  AORTA/IVC/ILIACS; Future  2. Type II endoleak of aortic graft Recommend:  Patient is status post successful endovascular repair of the AAA.    No further intervention is required at this time.   No endoleak is detected and the aneurysm sac is stable.   The patient will continue antiplatelet therapy as prescribed as well as aggressive management of hyperlipidemia. Exercise is encouraged.    However, endografts require continued surveillance with ultrasound or CT scan. This is mandatory  to detect any changes that allow repressurization of the aneurysm sac.  The patient is informed that this would be asymptomatic.   The patient is reminded that lifelong routine surveillance is a necessity with an endograft. Patient will continue to follow-up at the specified interval with ultrasound of the aorta. - VAS US  AORTA/IVC/ILIACS; Future  3. Aneurysm of ascending aorta without rupture (HCC) Recommend:   No surgery or intervention is indicated at this time.   The patient has an asymptomatic thoracic aortic aneurysm that is less than 6.0 cm in maximal diameter.  I have discussed the natural history of thoracic aortic aneurysm and the small risk of rupture for aneurysm less than 6.5 cm in size.  However, as these small aneurysms tend to enlarge over time, continued surveillance with CT scan is mandatory.    I have also discussed optimizing medical management with hypertension and lipid control and the negative effect that any tobacco products have on aneurysmal disease.  The patient is also encouraged to exercise a minimum of 30 minutes 4 times a week.    Should the patient develop new onset chest or back pain or signs of peripheral embolization they are instructed to seek medical attention immediately and to alert the physician providing care that they have an aneurysm in the chest.    The patient voices their understanding.   The patient will return as ordered with a CT scan of the chest  4. Lymphedema Recommend:  No surgery or intervention at this point in time.  I have reviewed my discussion with the patient regarding venous insufficiency and why it causes symptoms. I have discussed with the patient the chronic skin changes that accompany venous insufficiency and the long term sequela such as ulceration. Patient will contnue wearing graduated compression stockings on a daily basis, as this has provided excellent control of his edema. The patient will put the stockings on first  thing in the morning and removing them in the evening. The patient  is reminded not to sleep in the stockings.  In addition, behavioral modification including elevation during the day will be initiated. Exercise is strongly encouraged.  Previous duplex ultrasound of the lower extremities shows normal deep system, no significant superficial reflux was identified.  Given the patient's good control and lack of any problems regarding the venous insufficiency and lymphedema a lymph pump in not need at this time.    5. Permanent atrial fibrillation (HCC) Continue antiarrhythmia medications as already ordered, these medications have been reviewed and there are no changes at this time.  Continue anticoagulation as ordered by Cardiology Service  6. Primary hypertension Continue antihypertensive medications as already ordered, these medications have been reviewed and there are no changes at this time. d   Cordella Shawl, MD  10/07/2023 3:22 PM

## 2023-10-08 ENCOUNTER — Encounter (INDEPENDENT_AMBULATORY_CARE_PROVIDER_SITE_OTHER): Payer: Self-pay | Admitting: Vascular Surgery

## 2023-10-08 ENCOUNTER — Ambulatory Visit (INDEPENDENT_AMBULATORY_CARE_PROVIDER_SITE_OTHER): Admitting: Vascular Surgery

## 2023-10-08 ENCOUNTER — Ambulatory Visit (INDEPENDENT_AMBULATORY_CARE_PROVIDER_SITE_OTHER)

## 2023-10-08 ENCOUNTER — Encounter (INDEPENDENT_AMBULATORY_CARE_PROVIDER_SITE_OTHER)

## 2023-10-08 VITALS — BP 132/89 | HR 88 | Ht 69.0 in | Wt 277.0 lb

## 2023-10-08 DIAGNOSIS — I4821 Permanent atrial fibrillation: Secondary | ICD-10-CM

## 2023-10-08 DIAGNOSIS — I714 Abdominal aortic aneurysm, without rupture, unspecified: Secondary | ICD-10-CM | POA: Diagnosis not present

## 2023-10-08 DIAGNOSIS — I10 Essential (primary) hypertension: Secondary | ICD-10-CM

## 2023-10-08 DIAGNOSIS — I9789 Other postprocedural complications and disorders of the circulatory system, not elsewhere classified: Secondary | ICD-10-CM

## 2023-10-08 DIAGNOSIS — I7143 Infrarenal abdominal aortic aneurysm, without rupture: Secondary | ICD-10-CM

## 2023-10-08 DIAGNOSIS — I890 Lymphedema, not elsewhere classified: Secondary | ICD-10-CM | POA: Diagnosis not present

## 2023-10-08 DIAGNOSIS — I7121 Aneurysm of the ascending aorta, without rupture: Secondary | ICD-10-CM

## 2023-11-18 ENCOUNTER — Encounter: Payer: Self-pay | Admitting: Cardiology

## 2023-11-22 ENCOUNTER — Ambulatory Visit: Admitting: Cardiology

## 2023-11-29 ENCOUNTER — Ambulatory Visit: Attending: Cardiology | Admitting: Cardiology

## 2023-11-29 ENCOUNTER — Encounter: Payer: Self-pay | Admitting: Cardiology

## 2023-11-29 VITALS — BP 132/62 | HR 66 | Ht 69.0 in | Wt 284.4 lb

## 2023-11-29 DIAGNOSIS — I5030 Unspecified diastolic (congestive) heart failure: Secondary | ICD-10-CM | POA: Diagnosis not present

## 2023-11-29 DIAGNOSIS — I4821 Permanent atrial fibrillation: Secondary | ICD-10-CM

## 2023-11-29 DIAGNOSIS — I25118 Atherosclerotic heart disease of native coronary artery with other forms of angina pectoris: Secondary | ICD-10-CM | POA: Diagnosis not present

## 2023-11-29 NOTE — Patient Instructions (Signed)

## 2023-11-29 NOTE — Progress Notes (Signed)
 Cardiology Office Note:    Date:  11/29/2023   ID:  Leslie Prince, DOB [birth year], MRN [id number]  PCP:  Glover Lenis, MD  [facility] Cardiologist:  Redell Cave, MD  [facility] Electrophysiologist:  None   Referring MD: Glover Lenis, MD   Chief Complaint  Patient presents with   6 month follow up     Patient c/o shortness of breath with walking a long distance (from the parking lot for his visit here today).     History of Present Illness:    Leslie Prince is a 78 y.o. male with a hx of chronic atrial fibrillation on Pradaxa , CAD PCI w/ DES to LCx 2011, CABG x 3 in 1997 (Left heart cath 01/2023 patent LIMA to LAD, patent left circumflex stent, occluded SVG to OM), mitral valve repair 1997, AAA s/p endovascular stent repair 11/2019, hypertension, former smoker, COPD who presents for follow-up.  Doing okay, overall takes torsemide  as prescribed, sometimes will hold off on dosage if he is not close to her bilateral.  States eating out a lot, high salt diet.   Prior notes Left heart cath 01/2023 patent LIMA to LAD, patent left circumflex stent, occluded SVG to OM Echo 08/2021 EF 55 to 60%, normal functioning mitral valve prosthesis/ring. Patient had a recent abdominal CT showing abdominal aortic aneurysm up to 5 cm.  Endovascular repair by vascular surgery was performed on 11/27/2019.   Echocardiogram 11/2019 showed low normal ejection fraction, EF 50 to 55%.  Mild ascending aorta dilatation, 42 mm.   Past Medical History:  Diagnosis Date   Atrial fibrillation (HCC)    Cancer [facility])    prostate cancer   CHF (congestive heart failure) (HCC)    COPD (chronic obstructive pulmonary disease) (HCC)    Coronary artery disease    Depression    Hepatitis A infection    History of colon polyps    Hyperlipidemia    Hypertension    Mitral valve problem    Sleep apnea     Past Surgical History:  Procedure Laterality Date   CARDIAC SURGERY     CARDIAC VALVE  REPLACEMENT     CARDIOVERSION  2013   CATARACT EXTRACTION W/PHACO Right 08/06/2019   Procedure: CATARACT EXTRACTION PHACO AND INTRAOCULAR LENS PLACEMENT (IOC) RIGHT 12.34  01:25.4  14.5%;  Surgeon: Mittie Gaskin, MD;  Location: [facility];  Service: Ophthalmology;  Laterality: Right;   CATARACT EXTRACTION W/PHACO Left 08/27/2019   Procedure: CATARACT EXTRACTION PHACO AND INTRAOCULAR LENS PLACEMENT (IOC) LEFT 7.92 01:16.9 10.3%;  Surgeon: Mittie Gaskin, MD;  Location: [facility];  Service: Ophthalmology;  Laterality: Left;  sleep apnea   COLONOSCOPY N/A 09/28/2021   Procedure: COLONOSCOPY;  Surgeon: Toledo, Ladell POUR, MD;  Location: [facility] ENDOSCOPY;  Service: Gastroenterology;  Laterality: N/A;  Cannot move up   COLONOSCOPY WITH PROPOFOL  N/A 11/07/2017   Procedure: COLONOSCOPY WITH PROPOFOL ;  Surgeon: Viktoria Lamar DASEN, MD;  Location: [facility] ENDOSCOPY;  Service: Endoscopy;  Laterality: N/A;   CORONARY ANGIOPLASTY WITH STENT PLACEMENT  2011   CORONARY ARTERY BYPASS GRAFT     during mitral valve repair   ENDOVASCULAR REPAIR/STENT GRAFT N/A 11/26/2019   Procedure: ENDOVASCULAR REPAIR/STENT GRAFT;  Surgeon: Jama Cordella MATSU, MD;  Location: [facility] INVASIVE CV LAB;  Service: Cardiovascular;  Laterality: N/A;   EYE SURGERY     LOWER EXTREMITY ANGIOGRAPHY N/A 08/29/2022   Procedure: Lower Extremity Angiography;  Surgeon: Jama Cordella MATSU, MD;  Location: [facility] INVASIVE CV LAB;  Service: Cardiovascular;  Laterality: N/A;   MITRAL VALVE REPAIR  1997   RIGHT/LEFT HEART CATH AND CORONARY ANGIOGRAPHY Bilateral 02/05/2023   Procedure: RIGHT/LEFT HEART CATH AND CORONARY ANGIOGRAPHY;  Surgeon: Darron Deatrice LABOR, MD;  Location: [facility] INVASIVE CV LAB;  Service: Cardiovascular;  Laterality: Bilateral;    Current Medications: Current Meds  Medication Sig   albuterol  (PROVENTIL ) (2.5 MG/3ML) 0.083% nebulizer solution Take 2.5 mg by nebulization every 6 (six) hours as needed for  wheezing.   albuterol  (VENTOLIN  HFA) 108 (90 Base) MCG/ACT inhaler Inhale 2 puffs into the lungs every 6 (six) hours as needed for wheezing.    allopurinol  (ZYLOPRIM ) 100 MG tablet Take 100 mg by mouth daily.   atorvastatin  (LIPITOR) 20 MG tablet Take 20 mg by mouth at bedtime.    Calcium  Carb-Cholecalciferol (OYSTER SHELL CALCIUM  W/D) 500-5 MG-MCG TABS Take 1 tablet by mouth daily.   calcium  carbonate (TUMS - DOSED IN MG ELEMENTAL CALCIUM ) 500 MG chewable tablet Chew 1 tablet by mouth 2 (two) times daily.   colchicine  0.6 MG tablet Take 0.6 mg by mouth as directed. Take 2 tablets at onset of gout flare, then 1 tablet one hour later   CRANBERRY EXTRACT PO Take by mouth.   dabigatran  (PRADAXA ) 150 MG CAPS capsule Take 1 capsule (150 mg total) by mouth 2 (two) times daily.   enalapril  (VASOTEC ) 20 MG tablet Take 1 tablet (20 mg total) by mouth 2 (two) times daily.   Fluticasone -Salmeterol (ADVAIR ) 250-50 MCG/DOSE AEPB Inhale 1 puff into the lungs 2 (two) times daily.    metoprolol  succinate (TOPROL -XL) 100 MG 24 hr tablet Take 100 mg by mouth daily.   Misc Natural Products (TART CHERRY ADVANCED PO) Take by mouth daily.    montelukast  (SINGULAIR ) 10 MG tablet Take 10 mg by mouth at bedtime.    potassium chloride  (KLOR-CON  M) 10 MEQ tablet Take 1 tablet (10 mEq total) by mouth 2 (two) times daily.   sildenafil  (VIAGRA ) 100 MG tablet Take 1 tablet (100 mg total) by mouth daily as needed for erectile dysfunction.   tamsulosin  (FLOMAX ) 0.4 MG CAPS capsule Take 1 capsule (0.4 mg total) by mouth daily after supper.   tiotropium (SPIRIVA ) 18 MCG inhalation capsule Place 18 mcg into inhaler and inhale daily.   torsemide  (DEMADEX ) 20 MG tablet Take 2 tablets (40 mg total) by mouth daily.     Allergies:   Patient has no active allergies.   Social History   Socioeconomic History   Marital status: Married    Spouse name: Not on file   Number of children: Not on file   Years of education: Not on file    Highest education level: Not on file  Occupational History   Not on file  Tobacco Use   Smoking status: Former    Current packs/day: 0.00    Average packs/day: 1 pack/day for 25.0 years (25.0 ttl pk-yrs)    Types: Cigarettes    Start date: 06/21/1970    Quit date: 06/21/1995    Years since quitting: 28.4   Smokeless tobacco: Never  Vaping Use   Vaping status: Never Used  Substance and Sexual Activity   Alcohol use: Not Currently    Comment: Rare   Drug use: Never   Sexual activity: Yes    Birth control/protection: None  Other Topics Concern   Not on file  Social History Narrative   Lives with wife, Almarie.  Pet, cat, in house.    Social Drivers of Health  Financial Resource Strain: Low Risk  (10/28/2023)   Received [facility]   Overall Financial Resource Strain (CARDIA)    Difficulty of Paying Living Expenses: Not hard at all  Food Insecurity: No Food Insecurity (10/28/2023)   Received from [facility]   Hunger Vital Sign    Within the past 12 months, you worried that your food would run out before you got the money to buy more.: Never true    Within the past 12 months, the food you bought just didn't last and you didn't have money to get more.: Never true  Transportation Needs: No Transportation Needs (10/28/2023)   Received from [facility] - Transportation    In the past 12 months, has lack of transportation kept you from medical appointments or from getting medications?: No    Lack of Transportation (Non-Medical): No  Physical Activity: Not on file  Stress: Not on file  Social Connections: Not on file     Family History: The patient's family history includes Hypertension in his mother; Valvular heart disease in his father.  ROS:   Please see the history of present illness.     All other systems reviewed and are negative.  EKGs/Labs/Other Studies Reviewed:    The following studies were  reviewed today:  EKG Interpretation Date/Time:  Thursday November 29 2023 10:59:13 EDT Ventricular Rate:  66 PR Interval:    QRS Duration:  102 QT Interval:  446 QTC Calculation: 467 R Axis:   -8  Text Interpretation: Atrial fibrillation Nonspecific ST abnormality Confirmed by Darliss Rogue ([id number]) on 11/29/2023 11:07:42 AM   Recent Labs: 01/23/2023: BUN 20; Creatinine, Ser 0.97; Platelets 128 02/05/2023: Hemoglobin 9.9; Potassium 3.7; Sodium 142  Recent Lipid Panel No results found for: CHOL, TRIG, HDL, CHOLHDL, VLDL, LDLCALC, LDLDIRECT  Physical Exam:    VS:  BP 132/62 (BP Location: Left Arm, Patient Position: Sitting, Cuff Size: Normal)   Pulse 66   Ht 5' 9 (1.753 m)   Wt 284 lb 6 oz (129 kg)   SpO2 96%   BMI 41.99 kg/m     Wt Readings from Last 3 Encounters:  11/29/23 284 lb 6 oz (129 kg)  10/08/23 277 lb (125.6 kg)  06/14/23 269 lb (122 kg)     GEN:  Well nourished, well developed in no acute distress HEENT: Normal NECK: No JVD; No carotid bruits CARDIAC: Irregular irregular, no murmurs, rubs, gallops RESPIRATORY: Diminished breath sounds at bases ABDOMEN: Soft, non-tender, non-distended MUSCULOSKELETAL:  1+ edema; No deformity  SKIN: Warm and dry NEUROLOGIC:  Alert and oriented x 3 PSYCHIATRIC:  Normal affect   ASSESSMENT:    1. Coronary artery disease of native artery of native heart with stable angina pectoris (HCC)   2. Heart failure with preserved ejection fraction, unspecified HF chronicity (HCC)   3. Permanent atrial fibrillation (HCC)   4. Morbid obesity (HCC)    PLAN:    In order of problems listed above:   CAD/CABG x 3 1997, PCI to the left circumflex.  LHC 10/24 patent LIMA to LAD, patent left circumflex stents occluded SVG to OM .  Cont Lipitor 20, Toprol -XL 100 mg daily, Pradaxa . HFpEF, 1+ edema. declines higher dose of torsemide .  Continue torsemide  40 mg daily.  Low-salt diet emphasized. permanent atrial fibrillation,  heart rate controlled.  Continue Toprol -XL 100 mg daily and Pradaxa  150 mg daily.   Morbid obesity, low-calorie diet, weight loss, increase activity again strongly  advised.  I believe weight loss will help patient's overall function and fatigue levels.  Follow-up in 6 months.      Medication Adjustments/Labs and Tests Ordered: Current medicines are reviewed at length with the patient today.  Concerns regarding medicines are outlined above.  Orders Placed This Encounter  Procedures   EKG 12-Lead    No orders of the defined types were placed in this encounter.    Patient Instructions  Medication Instructions:  Your physician recommends that you continue on your current medications as directed. Please refer to the Current Medication list given to you today.   *If you need a refill on your cardiac medications before your next appointment, please call your pharmacy*  Lab Work: No labs ordered today  If you have labs (blood work) drawn today and your tests are completely normal, you will receive your results only by: MyChart Message (if you have MyChart) OR A paper copy in the mail If you have any lab test that is abnormal or we need to change your treatment, we will call you to review the results.  Testing/Procedures: No test ordered today   Follow-Up: At [facility], you and your health needs are our priority.  As part of our continuing mission to provide you with exceptional heart care, our providers are all part of one team.  This team includes your primary Cardiologist (physician) and Advanced Practice Providers or APPs (Physician Assistants and Nurse Practitioners) who all work together to provide you with the care you need, when you need it.  Your next appointment:   6 month(s)  Provider:   You may see Redell Cave, MD or one of the following Advanced Practice Providers on your designated Care Team:   Lonni Meager, NP Lesley Maffucci, PA-C Bernardino Bring,  PA-C Cadence Gannett, PA-C Tylene Lunch, NP Barnie Hila, NP    We recommend signing up for the patient portal called MyChart.  Sign up information is provided on this After Visit Summary.  MyChart is used to connect with patients for Virtual Visits (Telemedicine).  Patients are able to view lab/test results, encounter notes, upcoming appointments, etc.  Non-urgent messages can be sent to your provider as well.   To learn more about what you can do with MyChart, go to [email or web address].      Signed, Redell Cave, MD  11/29/2023 12:51 PM     [facility]

## 2023-12-05 ENCOUNTER — Encounter: Payer: Self-pay | Admitting: Urology

## 2023-12-26 ENCOUNTER — Other Ambulatory Visit: Payer: Self-pay | Admitting: Cardiology

## 2023-12-26 DIAGNOSIS — I4821 Permanent atrial fibrillation: Secondary | ICD-10-CM

## 2023-12-27 NOTE — Telephone Encounter (Signed)
 Prescription refill request for Pradaxa  received.  Indication:afib Last office visit:8/25 Weight:129  kg Age:77 Scr:0.8  7/25 CrCl:134.38  ml/min  Prescription refilled

## 2024-03-22 ENCOUNTER — Other Ambulatory Visit: Payer: Self-pay | Admitting: Cardiology

## 2024-05-15 ENCOUNTER — Telehealth: Payer: Self-pay | Admitting: Cardiology

## 2024-05-15 NOTE — Telephone Encounter (Signed)
 Tonya from CoverMyMeds requesting this prior auth to be canceled if not needed: [id number]

## 2024-06-11 ENCOUNTER — Ambulatory Visit: Admitting: Urology

## 2024-06-12 ENCOUNTER — Ambulatory Visit: Admitting: Urology

## 2024-06-27 ENCOUNTER — Ambulatory Visit: Admitting: Student

## 2024-10-06 ENCOUNTER — Ambulatory Visit (INDEPENDENT_AMBULATORY_CARE_PROVIDER_SITE_OTHER): Admitting: Vascular Surgery

## 2024-10-06 ENCOUNTER — Encounter (INDEPENDENT_AMBULATORY_CARE_PROVIDER_SITE_OTHER)
